# Patient Record
Sex: Female | Born: 1960 | Race: White | Hispanic: No | Smoking: Former smoker
Health system: Southern US, Community
[De-identification: ages and names within clinical notes are randomized; demographics above are authoritative.]

## PROBLEM LIST (undated history)

## (undated) DIAGNOSIS — F32A Depression, unspecified: Secondary | ICD-10-CM

## (undated) DIAGNOSIS — R928 Other abnormal and inconclusive findings on diagnostic imaging of breast: Secondary | ICD-10-CM

## (undated) DIAGNOSIS — K219 Gastro-esophageal reflux disease without esophagitis: Secondary | ICD-10-CM

## (undated) DIAGNOSIS — M8580 Other specified disorders of bone density and structure, unspecified site: Secondary | ICD-10-CM

## (undated) DIAGNOSIS — E785 Hyperlipidemia, unspecified: Secondary | ICD-10-CM

## (undated) DIAGNOSIS — E039 Hypothyroidism, unspecified: Secondary | ICD-10-CM

## (undated) DIAGNOSIS — Z87891 Personal history of nicotine dependence: Secondary | ICD-10-CM

## (undated) DIAGNOSIS — Z1239 Encounter for other screening for malignant neoplasm of breast: Secondary | ICD-10-CM

## (undated) DIAGNOSIS — F329 Major depressive disorder, single episode, unspecified: Secondary | ICD-10-CM

## (undated) HISTORY — DX: Hypercalcemia: E83.52

## (undated) HISTORY — DX: Hyperlipidemia, unspecified: E78.5

## (undated) HISTORY — DX: Personal history of nicotine dependence: Z87.891

## (undated) HISTORY — DX: Encounter for other screening for malignant neoplasm of breast: Z12.39

## (undated) HISTORY — PX: WISDOM TOOTH EXTRACTION: SHX21

## (undated) HISTORY — DX: Other abnormal and inconclusive findings on diagnostic imaging of breast: R92.8

## (undated) HISTORY — DX: Other specified disorders of bone density and structure, unspecified site: M85.80

---

## 2009-11-13 ENCOUNTER — Ambulatory Visit: Payer: Self-pay | Admitting: Family Medicine

## 2010-12-20 ENCOUNTER — Ambulatory Visit: Payer: Self-pay | Admitting: Family Medicine

## 2011-01-08 ENCOUNTER — Ambulatory Visit: Payer: Self-pay | Admitting: Family Medicine

## 2012-01-15 ENCOUNTER — Ambulatory Visit: Payer: Self-pay

## 2012-01-15 LAB — HM MAMMOGRAPHY

## 2012-01-16 ENCOUNTER — Ambulatory Visit: Payer: Self-pay

## 2012-01-21 ENCOUNTER — Ambulatory Visit: Payer: Self-pay

## 2012-03-21 IMAGING — US ULTRASOUND RIGHT BREAST
1 series · 17 of 25 positions shown · non-contrast
Comparison: none

REASON FOR EXAM: AV RT MASS
COMMENTS:

[Series 1: ultrasound right breast · 17 of 25 slices shown]
[im 1/25]
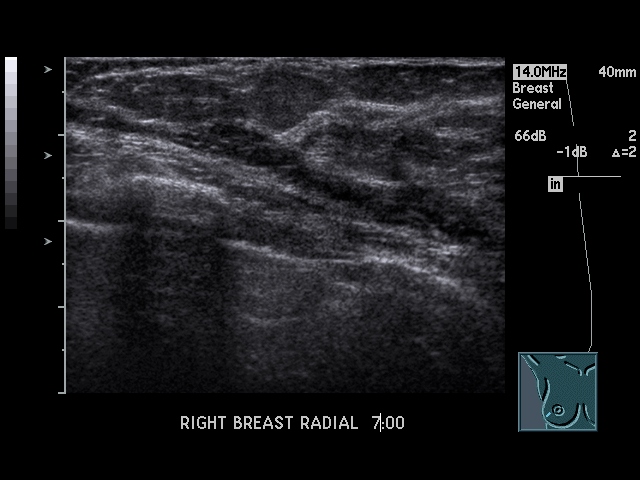
[im 3/25]
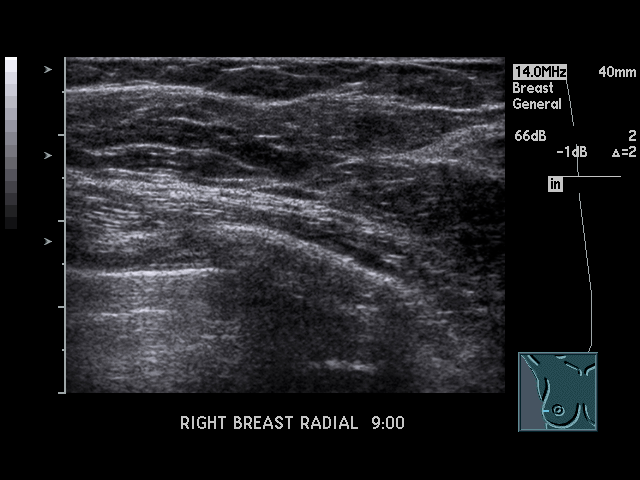
[im 4/25]
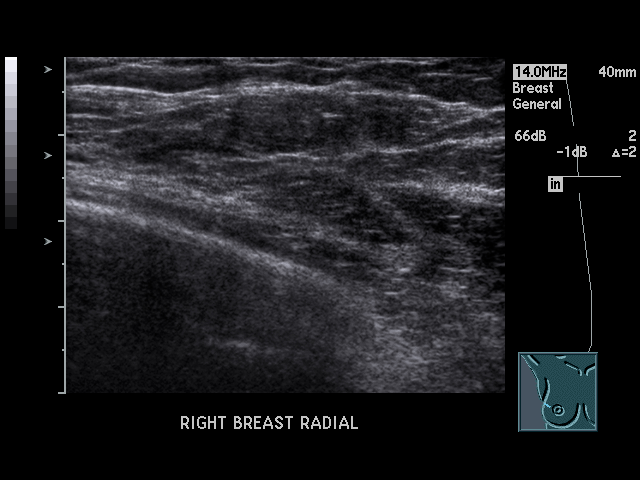
[im 6/25]
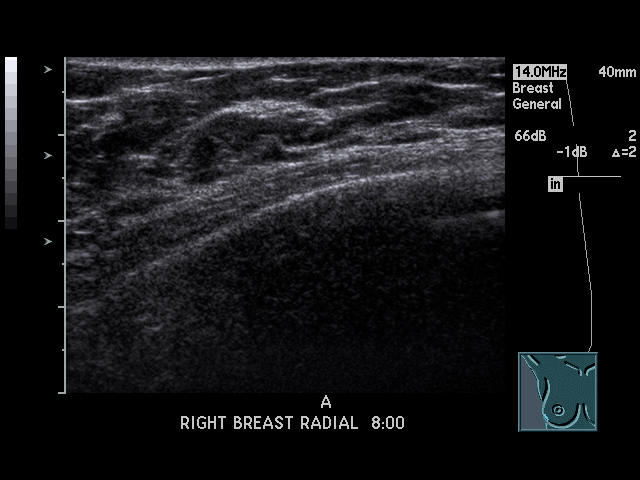
[im 7/25]
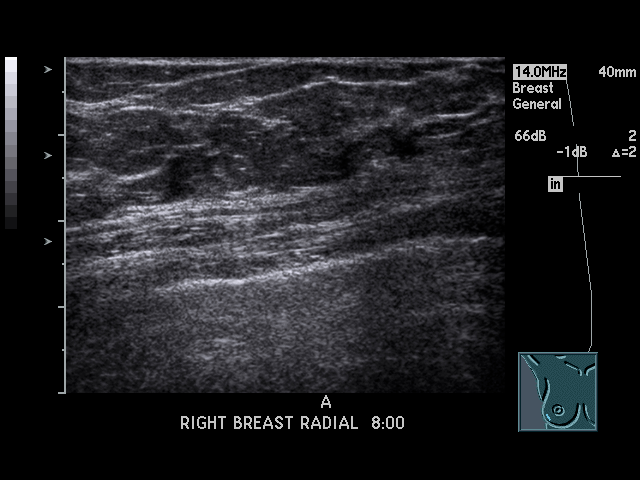
[im 9/25]
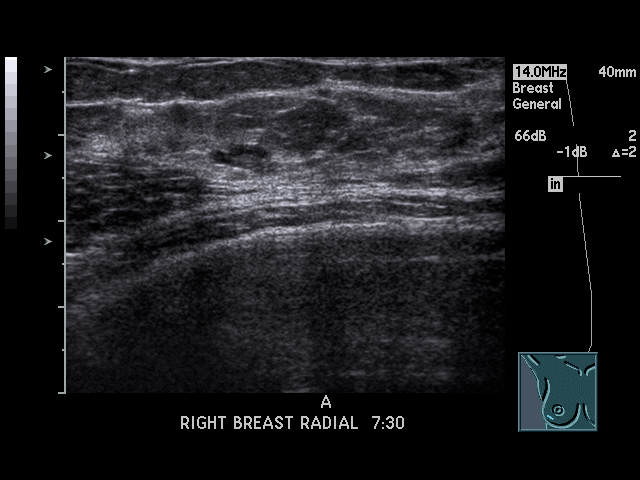
[im 10/25]
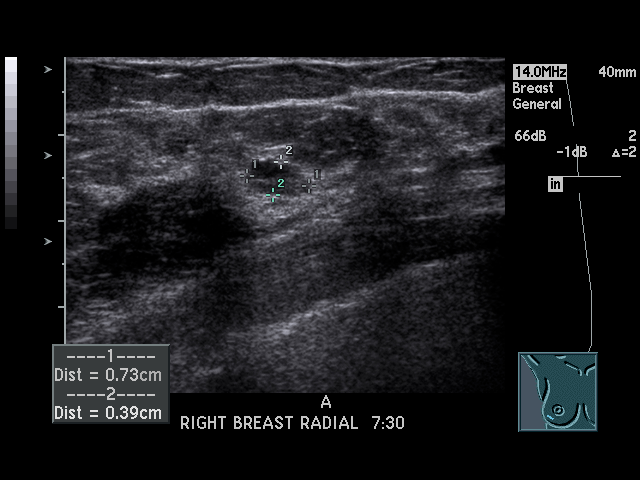
[im 12/25]
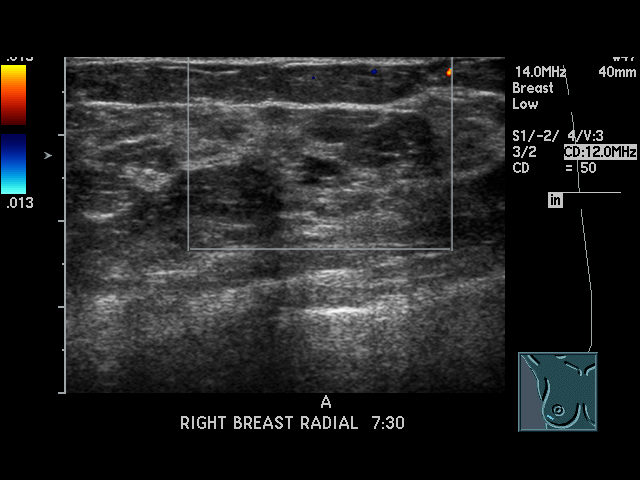
[im 13/25]
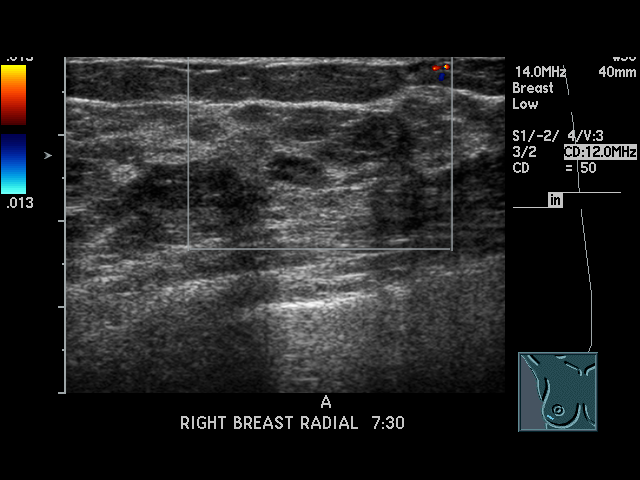
[im 14/25]
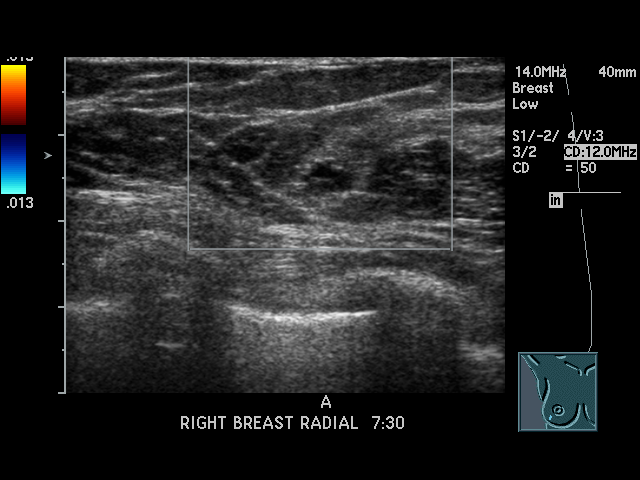
[im 16/25]
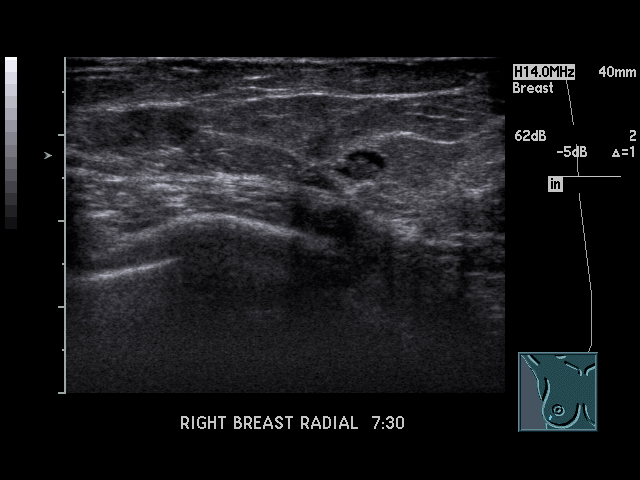
[im 17/25]
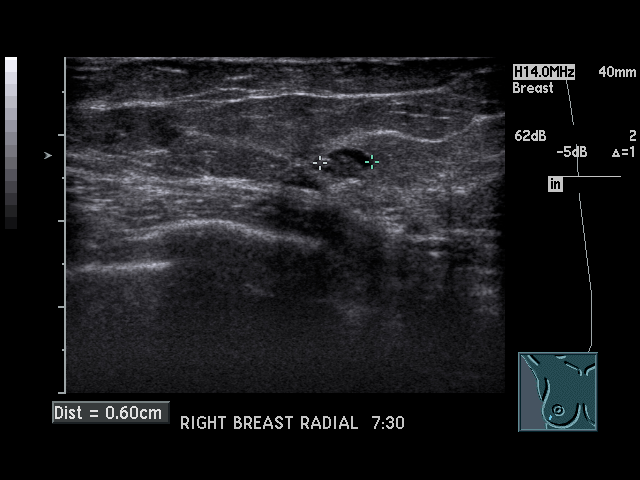
[im 19/25]
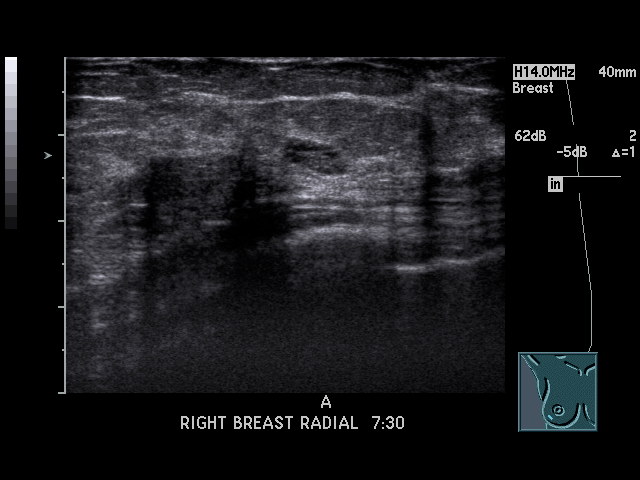
[im 20/25]
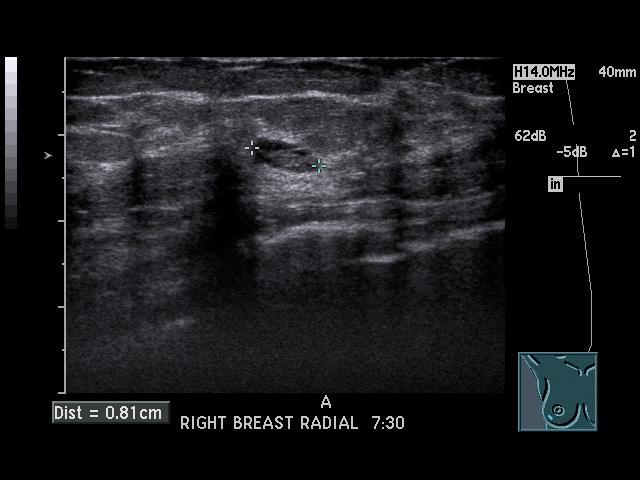
[im 22/25]
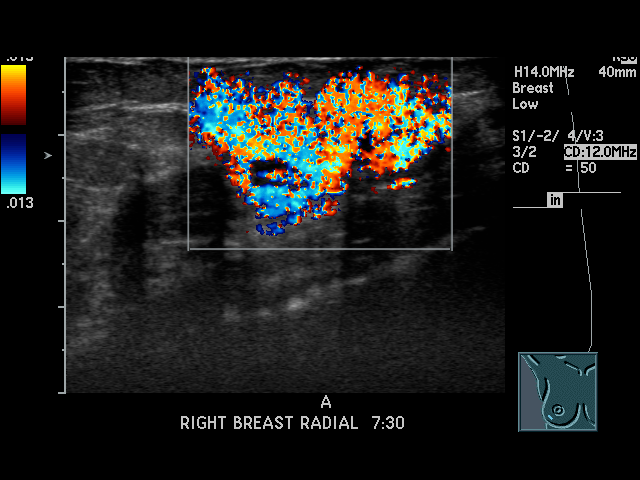
[im 23/25]
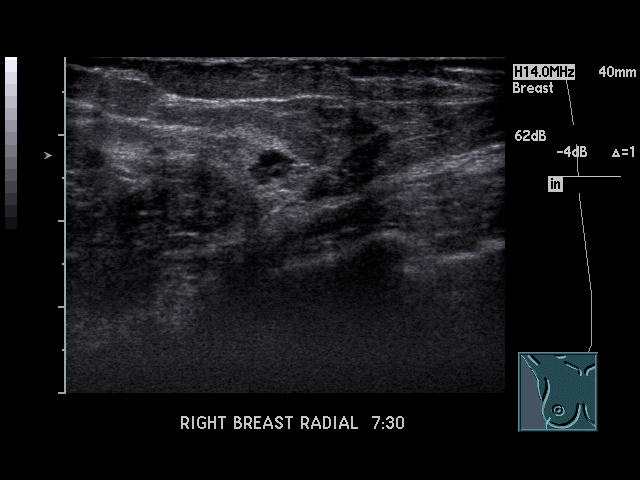
[im 25/25]
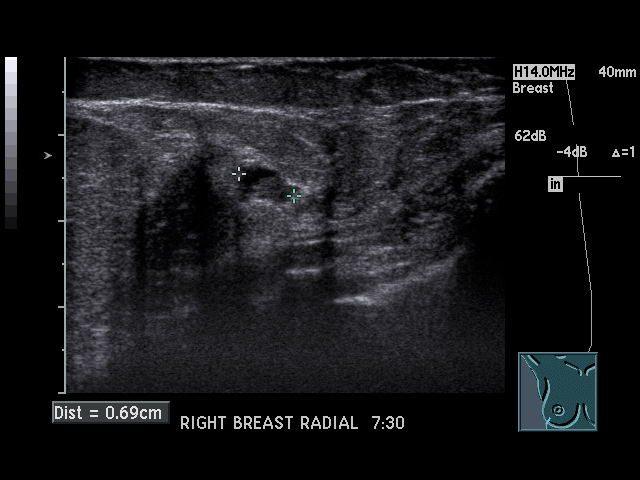

[17 of 25 positions shown; findings below may reference images not displayed]

PROCEDURE:     US  - US BREAST RIGHT  - January 08, 2011  [DATE]

RESULT:     Targeted ultrasound was performed on this date and reported with
the additional views mammogram report also performed this day. In summary,
there is an oval shaped, smoothly marginated 8.1 mm nodule at [DATE] that is
considered sufficient to explain the mammographic finding and is most
compatible with a cyst containing debris. Continued annual screening
mammography is recommended.
IMPRESSION: 1. Please see above.

BI-RADS: Category 2 - Benign Finding.

## 2012-07-03 ENCOUNTER — Encounter: Payer: Self-pay | Admitting: *Deleted

## 2012-07-03 DIAGNOSIS — R928 Other abnormal and inconclusive findings on diagnostic imaging of breast: Secondary | ICD-10-CM | POA: Insufficient documentation

## 2012-07-03 DIAGNOSIS — Z1239 Encounter for other screening for malignant neoplasm of breast: Secondary | ICD-10-CM | POA: Insufficient documentation

## 2012-07-03 DIAGNOSIS — Z87891 Personal history of nicotine dependence: Secondary | ICD-10-CM | POA: Insufficient documentation

## 2012-07-20 ENCOUNTER — Ambulatory Visit: Payer: Self-pay

## 2013-01-20 ENCOUNTER — Ambulatory Visit: Payer: Self-pay

## 2013-02-04 ENCOUNTER — Telehealth: Payer: Self-pay

## 2013-02-04 NOTE — Telephone Encounter (Signed)
Message copied by Sinda Du on Thu Feb 04, 2013  8:15 AM ------      Message from: [place], [place] W      Created: Wed Feb 03, 2013  9:37 PM      Regarding: RE: BCCCP recall       No further follow up required.      ----- Message -----         From: Sinda Du, LPN         Sent: 02/03/2013   2:25 PM           To: Earline Mayotte, MD      Subject: BCCCP recall                                             Terra called today and wanted to cancel her follow up appointment with you. She did not feel that she needed to be seen any more. She said her mammogram report was normal. I called Blima Singer at [facility] and let her know. She just wanted to know what you decided as far as any need for further follow up.       ------

## 2013-02-04 NOTE — Telephone Encounter (Signed)
See copied message. Call to Blima Singer at [facility] to notify her of Dr Rutherford Nail decision.

## 2013-02-17 ENCOUNTER — Ambulatory Visit: Payer: Self-pay | Admitting: General Surgery

## 2014-03-28 ENCOUNTER — Encounter: Payer: Self-pay | Admitting: *Deleted

## 2014-08-10 LAB — HM PAP SMEAR

## 2015-03-14 ENCOUNTER — Ambulatory Visit: Payer: Self-pay | Admitting: Unknown Physician Specialty

## 2015-05-18 ENCOUNTER — Ambulatory Visit (INDEPENDENT_AMBULATORY_CARE_PROVIDER_SITE_OTHER): Payer: BC Managed Care – PPO | Admitting: Family Medicine

## 2015-05-18 ENCOUNTER — Encounter: Payer: Self-pay | Admitting: Family Medicine

## 2015-05-18 VITALS — BP 143/83 | HR 69 | Temp 99.3°F | Ht 64.1 in | Wt 133.0 lb

## 2015-05-18 DIAGNOSIS — J019 Acute sinusitis, unspecified: Secondary | ICD-10-CM

## 2015-05-18 DIAGNOSIS — J329 Chronic sinusitis, unspecified: Secondary | ICD-10-CM | POA: Insufficient documentation

## 2015-05-18 MED ORDER — AMOXICILLIN-POT CLAVULANATE 875-125 MG PO TABS: 1.0000 | ORAL_TABLET | Freq: Two times a day (BID) | ORAL | Status: DC

## 2015-05-18 NOTE — Progress Notes (Signed)
   BP 143/83 mmHg  Pulse 69  Temp(Src) 99.3 F (37.4 C)  Ht 5' 4.1" (1.628 m)  Wt 133 lb (60.328 kg)  BMI 22.76 kg/m2  SpO2 99%  LMP  (LMP Unknown)   Subjective:    Patient ID: Laura Soto, female    DOB: [birth year], 54 y.o.   MRN: [id number]  HPI: Laura Soto is a 54 y.o. female  Chief Complaint  Patient presents with  . URI   With sinus pressure facial pain and congestion and fever or chills been ongoing for 4 weeks just not getting better now getting worse. Patient also coughing medications tried have included everything Tylenol cold and sinus is in next etc.6  Relevant past medical, surgical, family and social history reviewed and updated as indicated. Interim medical history since our last visit reviewed. Allergies and medications reviewed and updated.  Review of Systems  Constitutional: Positive for fever, chills and fatigue.  HENT: Positive for congestion, rhinorrhea, sinus pressure, sneezing and sore throat.   Respiratory: Positive for cough and shortness of breath.   Cardiovascular: Negative for chest pain, palpitations and leg swelling.    Per HPI unless specifically indicated above     Objective:    BP 143/83 mmHg  Pulse 69  Temp(Src) 99.3 F (37.4 C)  Ht 5' 4.1" (1.628 m)  Wt 133 lb (60.328 kg)  BMI 22.76 kg/m2  SpO2 99%  LMP  (LMP Unknown)  Wt Readings from Last 3 Encounters:  05/18/15 133 lb (60.328 kg)  08/10/14 136 lb (61.689 kg)  02/03/12 138 lb (62.596 kg)    Physical Exam  Constitutional: She is oriented to person, place, and time. She appears well-developed and well-nourished. No distress.  HENT:  Head: Normocephalic and atraumatic.  Right Ear: Hearing and external ear normal.  Left Ear: Hearing and external ear normal.  Nose: Nose normal.  Mouth/Throat: Oropharyngeal exudate present.  Eyes: Conjunctivae and lids are normal. Right eye exhibits no discharge. Left eye exhibits no discharge. No scleral icterus.  Neck: Normal range of  motion. Neck supple.  Cardiovascular: Normal rate, regular rhythm and normal heart sounds.   Pulmonary/Chest: Effort normal and breath sounds normal. No respiratory distress.  Musculoskeletal: Normal range of motion.  Lymphadenopathy:    She has no cervical adenopathy.  Neurological: She is alert and oriented to person, place, and time.  Skin: Skin is intact. No rash noted.  Psychiatric: She has a normal mood and affect. Her speech is normal and behavior is normal. Judgment and thought content normal. Cognition and memory are normal.    No results found for this or any previous visit.    Assessment & Plan:   Problem List Items Addressed This Visit      Respiratory   Sinusitis - Primary    Sinusitis discussed care and treatment use of Mucinex Mucinex D Tylenol Tylenol cold and sinus also will start Augmentin patient directions given on how to take to minimize GI effects.      Relevant Medications   amoxicillin-clavulanate (AUGMENTIN) 875-125 MG tablet       Follow up plan: Return if symptoms worsen or fail to improve, for Physical Exam with CW.

## 2015-05-18 NOTE — Assessment & Plan Note (Signed)
Sinusitis discussed care and treatment use of Mucinex Mucinex D Tylenol Tylenol cold and sinus also will start Augmentin patient directions given on how to take to minimize GI effects.

## 2015-05-19 ENCOUNTER — Telehealth: Payer: Self-pay | Admitting: Family Medicine

## 2015-05-19 MED ORDER — DOXYCYCLINE HYCLATE 100 MG PO TABS: 100.0000 mg | ORAL_TABLET | Freq: Two times a day (BID) | ORAL | Status: DC

## 2015-05-19 NOTE — Telephone Encounter (Signed)
Routing to Dr. Sanda Klein to see if she can send in a new antibiotic.  Dr. Jeananne Rama sent in antibiotic (Augmentin) 05/18/2015 for acute sinuitis. Patient has upset stomach, nausea and diarrhea. Maybe phenergan or zofran if don't want to switch antibiotic?

## 2015-05-19 NOTE — Telephone Encounter (Signed)
Pt called stated she is having bad side effects from Amoxtr? Pt states MAC said if this did not work for her to call back and he would send something else into the pharmacy. Pharm is [facility]. Please send different RX, call pt if further information is needed. Pt stated medication is causing diarrhea and nausea. Thanks

## 2015-05-19 NOTE — Telephone Encounter (Signed)
I spoke with patient; will send in different antibiotic

## 2015-05-26 ENCOUNTER — Ambulatory Visit (INDEPENDENT_AMBULATORY_CARE_PROVIDER_SITE_OTHER): Payer: BC Managed Care – PPO | Admitting: Unknown Physician Specialty

## 2015-05-26 ENCOUNTER — Encounter: Payer: Self-pay | Admitting: Unknown Physician Specialty

## 2015-05-26 VITALS — BP 133/80 | HR 74 | Temp 98.6°F | Ht 64.2 in | Wt 136.9 lb

## 2015-05-26 DIAGNOSIS — J0100 Acute maxillary sinusitis, unspecified: Secondary | ICD-10-CM | POA: Diagnosis not present

## 2015-05-26 MED ORDER — CEFUROXIME AXETIL 500 MG PO TABS: 500.0000 mg | ORAL_TABLET | Freq: Two times a day (BID) | ORAL | Status: DC

## 2015-05-26 NOTE — Progress Notes (Signed)
   BP 133/80 mmHg  Pulse 74  Temp(Src) 98.6 F (37 C)  Ht 5' 4.2" (1.631 m)  Wt 136 lb 14.4 oz (62.097 kg)  BMI 23.34 kg/m2  SpO2 98%  LMP  (LMP Unknown)   Subjective:    Patient ID: Laura Soto, female    DOB: [birth year], 54 y.o.   MRN: [id number]  HPI: Laura Soto is a 54 y.o. female  Chief Complaint  Patient presents with  . URI    pt was recenetly seen by Dr. Jeananne Rama for sinus problems but states she has not gotten any better.   Sinusitis This is a new (saw Dr. Jeananne Rama and got Augmentin and could not take it, got switched to Doxycycline.  No better) problem. The current episode started more than 1 month ago. The problem is unchanged. There has been no fever. Associated symptoms include congestion, ear pain, a hoarse voice, shortness of breath, sinus pressure and a sore throat. Past treatments include antibiotics.  Using netti pots and Flonase  Relevant past medical, surgical, family and social history reviewed and updated as indicated. Interim medical history since our last visit reviewed. Allergies and medications reviewed and updated.  Review of Systems  HENT: Positive for congestion, ear pain, hoarse voice, sinus pressure and sore throat.   Respiratory: Positive for shortness of breath.     Per HPI unless specifically indicated above     Objective:    BP 133/80 mmHg  Pulse 74  Temp(Src) 98.6 F (37 C)  Ht 5' 4.2" (1.631 m)  Wt 136 lb 14.4 oz (62.097 kg)  BMI 23.34 kg/m2  SpO2 98%  LMP  (LMP Unknown)  Wt Readings from Last 3 Encounters:  05/26/15 136 lb 14.4 oz (62.097 kg)  05/18/15 133 lb (60.328 kg)  08/10/14 136 lb (61.689 kg)    Physical Exam  Constitutional: She is oriented to person, place, and time. She appears well-developed and well-nourished. No distress.  HENT:  Head: Normocephalic and atraumatic.  Right Ear: Tympanic membrane and ear canal normal.  Left Ear: Tympanic membrane and ear canal normal.  Nose: No rhinorrhea. Right sinus  exhibits maxillary sinus tenderness. Right sinus exhibits no frontal sinus tenderness. Left sinus exhibits maxillary sinus tenderness. Left sinus exhibits no frontal sinus tenderness.  Eyes: Conjunctivae and lids are normal. Right eye exhibits no discharge. Left eye exhibits no discharge. No scleral icterus.  Cardiovascular: Normal rate and regular rhythm.   Pulmonary/Chest: Effort normal and breath sounds normal. No respiratory distress.  Abdominal: Normal appearance. There is no splenomegaly or hepatomegaly.  Musculoskeletal: Normal range of motion.  Neurological: She is alert and oriented to person, place, and time.  Skin: Skin is intact. No rash noted. No pallor.  Psychiatric: She has a normal mood and affect. Her behavior is normal. Judgment and thought content normal.    No results found for this or any previous visit.    Assessment & Plan:   Problem List Items Addressed This Visit    None    Visit Diagnoses    Acute maxillary sinusitis, recurrence not specified    -  Primary    Relevant Medications    cefUROXime (CEFTIN) 500 MG tablet        Follow up plan: Return if symptoms worsen or fail to improve.

## 2015-06-08 ENCOUNTER — Telehealth: Payer: Self-pay

## 2015-06-08 MED ORDER — FLUCONAZOLE 150 MG PO TABS: 150.0000 mg | ORAL_TABLET | Freq: Once | ORAL | Status: DC

## 2015-06-08 NOTE — Telephone Encounter (Signed)
Tried to call patient but she did not answer and I left her a voicemail asking for her to return my call. But like I said, I went back and looked at the paper and it stated a yeast infection and not a UTI.

## 2015-06-08 NOTE — Telephone Encounter (Signed)
My mistake. The paper for the telephone call stated a yeast infection, not a UTI. I will still call the patient and see what kind of symptoms she is having.

## 2015-06-08 NOTE — Telephone Encounter (Signed)
Got a fax from the call center stating that the patient called stating she has a UTI from an antibiotic she was recently given. Patient would like something sent in for her. Patient was seen 05/26/15.

## 2015-06-08 NOTE — Telephone Encounter (Signed)
OK, no problem.  I will call her in some Diflucan

## 2015-06-08 NOTE — Telephone Encounter (Signed)
An antibiotic shoud not cause a UTI and in fact, treats it.  Do you think maybe she has a yeast infection?  Please ask what her symptoms are.  She may need to be seen

## 2015-06-08 NOTE — Telephone Encounter (Signed)
Patient notified

## 2015-07-13 ENCOUNTER — Other Ambulatory Visit: Payer: Self-pay

## 2015-07-13 MED ORDER — CITALOPRAM HYDROBROMIDE 20 MG PO TABS: 20.0000 mg | ORAL_TABLET | Freq: Every day | ORAL | Status: DC

## 2015-07-13 NOTE — Telephone Encounter (Signed)
Patient has physical scheduled for 08/15/15. Pharmacy is [facility] on [address] and they are requesting a 90 day supply.

## 2015-08-15 ENCOUNTER — Ambulatory Visit (INDEPENDENT_AMBULATORY_CARE_PROVIDER_SITE_OTHER): Payer: BC Managed Care – PPO | Admitting: Unknown Physician Specialty

## 2015-08-15 ENCOUNTER — Encounter: Payer: Self-pay | Admitting: Unknown Physician Specialty

## 2015-08-15 VITALS — BP 120/70 | HR 67 | Temp 98.3°F | Ht 65.0 in | Wt 138.0 lb

## 2015-08-15 DIAGNOSIS — Z113 Encounter for screening for infections with a predominantly sexual mode of transmission: Secondary | ICD-10-CM | POA: Diagnosis not present

## 2015-08-15 DIAGNOSIS — Z1239 Encounter for other screening for malignant neoplasm of breast: Secondary | ICD-10-CM | POA: Diagnosis not present

## 2015-08-15 DIAGNOSIS — Z0000 Encounter for general adult medical examination without abnormal findings: Secondary | ICD-10-CM | POA: Diagnosis not present

## 2015-08-15 MED ORDER — CITALOPRAM HYDROBROMIDE 20 MG PO TABS: 20.0000 mg | ORAL_TABLET | Freq: Every day | ORAL | Status: DC

## 2015-08-15 MED ORDER — FLUTICASONE PROPIONATE 50 MCG/ACT NA SUSP: 2.0000 | Freq: Every day | NASAL | Status: DC

## 2015-08-15 NOTE — Progress Notes (Signed)
BP 120/70 mmHg  Pulse 67  Temp(Src) 98.3 F (36.8 C)  Ht 5\' 5"  (1.651 m)  Wt 138 lb (62.596 kg)  BMI 22.96 kg/m2  SpO2 99%  LMP  (LMP Unknown)   Subjective:    Patient ID: Laura Soto, female    DOB: December 03, 1960, 55 y.o.   MRN: [id number]  HPI: Laura Soto is a 55 y.o. female  Chief Complaint  Patient presents with  . Annual Exam   Depression screen [facility] 2/9 08/15/2015  Decreased Interest 0  Down, Depressed, Hopeless 0  PHQ - 2 Score 0    Past Surgical History  Procedure Laterality Date  . Wisdom tooth extraction     Family History  Problem Relation Age of Onset  . Liver cancer Father     age 44  . Diabetes Father    Started smoking at 80 y.o and quit recently. Not interested in low dose CT screening.    Relevant past medical, surgical, family and social history reviewed and updated as indicated. Interim medical history since our last visit reviewed. Allergies and medications reviewed and updated.  Review of Systems  Constitutional: Negative.   HENT: Negative.   Eyes: Negative.   Respiratory: Negative.   Cardiovascular: Negative.   Gastrointestinal: Negative.   Endocrine: Negative.   Genitourinary: Negative.   Musculoskeletal: Negative.   Skin: Negative.   Allergic/Immunologic: Negative.   Neurological: Negative.   Hematological: Negative.   Psychiatric/Behavioral: Negative.     Per HPI unless specifically indicated above     Objective:    BP 120/70 mmHg  Pulse 67  Temp(Src) 98.3 F (36.8 C)  Ht 5\' 5"  (1.651 m)  Wt 138 lb (62.596 kg)  BMI 22.96 kg/m2  SpO2 99%  LMP  (LMP Unknown)  Wt Readings from Last 3 Encounters:  08/15/15 138 lb (62.596 kg)  05/26/15 136 lb 14.4 oz (62.097 kg)  05/18/15 133 lb (60.328 kg)    Physical Exam  Constitutional: She is oriented to person, place, and time. She appears well-developed and well-nourished.  HENT:  Head: Normocephalic and atraumatic.  Eyes: Pupils are equal, round, and reactive to light.  Right eye exhibits no discharge. Left eye exhibits no discharge. No scleral icterus.  Neck: Normal range of motion. Neck supple. Carotid bruit is not present. No thyromegaly present.  Cardiovascular: Normal rate, regular rhythm and normal heart sounds.  Exam reveals no gallop and no friction rub.   No murmur heard. Pulmonary/Chest: Effort normal and breath sounds normal. No respiratory distress. She has no wheezes. She has no rales.  Abdominal: Soft. Bowel sounds are normal. There is no tenderness. There is no rebound.  Genitourinary: No breast swelling, tenderness or discharge.  Musculoskeletal: Normal range of motion.  Lymphadenopathy:    She has no cervical adenopathy.  Neurological: She is alert and oriented to person, place, and time.  Skin: Skin is warm, dry and intact. No rash noted.  Psychiatric: She has a normal mood and affect. Her speech is normal and behavior is normal. Judgment and thought content normal. Cognition and memory are normal.    No results found for this or any previous visit.    Assessment & Plan:   Problem List Items Addressed This Visit    None    Visit Diagnoses    Breast cancer screening    -  Primary    Relevant Orders    MM Digital Screening    Routine screening for STI (sexually transmitted infection)  Relevant Orders    Hepatitis C Antibody    Routine general medical examination at a health care facility        Relevant Orders    Comprehensive metabolic panel    Lipid Panel w/o Chol/HDL Ratio    TSH        Follow up plan: Return in about 1 year (around 08/14/2016).

## 2015-08-16 ENCOUNTER — Telehealth: Payer: Self-pay | Admitting: Unknown Physician Specialty

## 2015-08-16 LAB — COMPREHENSIVE METABOLIC PANEL
A/G RATIO: 1.7 (ref 1.2–2.2)
ALBUMIN: 4.4 g/dL (ref 3.5–5.5)
ALK PHOS: 79 IU/L (ref 39–117)
ALT: 12 IU/L (ref 0–32)
AST: 14 IU/L (ref 0–40)
BILIRUBIN TOTAL: 0.2 mg/dL (ref 0.0–1.2)
BUN / CREAT RATIO: 17 (ref 9–23)
BUN: 18 mg/dL (ref 6–24)
CHLORIDE: 102 mmol/L (ref 96–106)
CO2: 24 mmol/L (ref 18–29)
Calcium: 9.5 mg/dL (ref 8.7–10.2)
Creatinine, Ser: 1.04 mg/dL — ABNORMAL HIGH (ref 0.57–1.00)
GFR calc non Af Amer: 60 mL/min/{1.73_m2} (ref >59)
GFR, EST AFRICAN AMERICAN: 69 mL/min/{1.73_m2} (ref >59)
GLOBULIN, TOTAL: 2.6 g/dL (ref 1.5–4.5)
Glucose: 81 mg/dL (ref 65–99)
POTASSIUM: 4.7 mmol/L (ref 3.5–5.2)
SODIUM: 140 mmol/L (ref 134–144)
TOTAL PROTEIN: 7 g/dL (ref 6.0–8.5)

## 2015-08-16 LAB — TSH: TSH: 18.46 u[IU]/mL — ABNORMAL HIGH (ref 0.450–4.500)

## 2015-08-16 LAB — LIPID PANEL W/O CHOL/HDL RATIO
Cholesterol, Total: 228 mg/dL — ABNORMAL HIGH (ref 100–199)
HDL: 62 mg/dL (ref >39)
LDL Calculated: 145 mg/dL — ABNORMAL HIGH (ref 0–99)
Triglycerides: 104 mg/dL (ref 0–149)
VLDL CHOLESTEROL CAL: 21 mg/dL (ref 5–40)

## 2015-08-16 LAB — HEPATITIS C ANTIBODY: HCV Ab: <0.1 s/co ratio (ref 0.0–0.9)

## 2015-08-16 MED ORDER — LEVOTHYROXINE SODIUM 50 MCG PO TABS: 50.0000 ug | ORAL_TABLET | Freq: Every day | ORAL | Status: DC

## 2015-08-16 NOTE — Telephone Encounter (Signed)
Discussed labs with patient.  TSH is 18.  Start Synthroid 50 mcgs

## 2015-08-17 ENCOUNTER — Telehealth: Payer: Self-pay | Admitting: Unknown Physician Specialty

## 2015-08-17 DIAGNOSIS — E039 Hypothyroidism, unspecified: Secondary | ICD-10-CM

## 2015-08-17 NOTE — Telephone Encounter (Signed)
Pt came in very concerned about lab results, would like a call back for further detail about TSH results. Pt stated information was a lot to absorb over the phone. Please call pt ASAP. Thanks.

## 2015-08-18 NOTE — Telephone Encounter (Signed)
Routing to provider  

## 2015-08-21 NOTE — Telephone Encounter (Signed)
Routing to Cheryl

## 2015-08-21 NOTE — Telephone Encounter (Signed)
Discussed hypothyroid.  Recheck T4 and TSH.  Stop pills for now and recheck in 1-2 weeks.

## 2015-08-21 NOTE — Telephone Encounter (Signed)
Please call pt back ASAP. She is returning Cheryl's call.

## 2015-08-29 NOTE — Telephone Encounter (Signed)
Pt scheduled 11/15/15 @ 4pm. Thanks.

## 2015-08-31 ENCOUNTER — Other Ambulatory Visit: Payer: Self-pay | Admitting: Unknown Physician Specialty

## 2015-09-21 ENCOUNTER — Emergency Department
Admission: EM | Admit: 2015-09-21 | Discharge: 2015-09-21 | Disposition: A | Discharge status: Home / Self Care | Payer: Worker's Compensation | Attending: Emergency Medicine | Admitting: Emergency Medicine

## 2015-09-21 ENCOUNTER — Encounter: Payer: Self-pay | Admitting: *Deleted

## 2015-09-21 DIAGNOSIS — W19XXXA Unspecified fall, initial encounter: Secondary | ICD-10-CM

## 2015-09-21 DIAGNOSIS — Y92000 Kitchen of unspecified non-institutional (private) residence as  the place of occurrence of the external cause: Secondary | ICD-10-CM | POA: Insufficient documentation

## 2015-09-21 DIAGNOSIS — Z79899 Other long term (current) drug therapy: Secondary | ICD-10-CM | POA: Insufficient documentation

## 2015-09-21 DIAGNOSIS — Y9389 Activity, other specified: Secondary | ICD-10-CM | POA: Diagnosis not present

## 2015-09-21 DIAGNOSIS — R51 Headache: Secondary | ICD-10-CM | POA: Diagnosis present

## 2015-09-21 DIAGNOSIS — Z87891 Personal history of nicotine dependence: Secondary | ICD-10-CM | POA: Insufficient documentation

## 2015-09-21 DIAGNOSIS — S0093XA Contusion of unspecified part of head, initial encounter: Secondary | ICD-10-CM

## 2015-09-21 DIAGNOSIS — E785 Hyperlipidemia, unspecified: Secondary | ICD-10-CM | POA: Insufficient documentation

## 2015-09-21 DIAGNOSIS — Y999 Unspecified external cause status: Secondary | ICD-10-CM | POA: Insufficient documentation

## 2015-09-21 DIAGNOSIS — W1800XA Striking against unspecified object with subsequent fall, initial encounter: Secondary | ICD-10-CM | POA: Insufficient documentation

## 2015-09-21 MED ORDER — METHOCARBAMOL 500 MG PO TABS: 500.0000 mg | ORAL_TABLET | Freq: Four times a day (QID) | ORAL | Status: DC

## 2015-09-21 MED ORDER — NAPROXEN 500 MG PO TABS: 500.0000 mg | ORAL_TABLET | Freq: Two times a day (BID) | ORAL | Status: DC

## 2015-09-21 NOTE — Discharge Instructions (Signed)
Facial or Scalp Contusion °A facial or scalp contusion is a deep bruise on the face or head. Injuries to the face and head generally cause a lot of swelling, especially around the eyes. Contusions are the result of an injury that caused bleeding under the skin. The contusion may turn blue, purple, or yellow. Minor injuries will give you a painless contusion, but more severe contusions may stay painful and swollen for a few weeks.  °CAUSES  °A facial or scalp contusion is caused by a blunt injury or trauma to the face or head area.  °SIGNS AND SYMPTOMS  °· Swelling of the injured area.   °· Discoloration of the injured area.   °· Tenderness, soreness, or pain in the injured area.   °DIAGNOSIS  °The diagnosis can be made by taking a medical history and doing a physical exam. An X-ray exam, CT scan, or MRI may be needed to determine if there are any associated injuries, such as broken bones (fractures). °TREATMENT  °Often, the best treatment for a facial or scalp contusion is applying cold compresses to the injured area. Over-the-counter medicines may also be recommended for pain control.  °HOME CARE INSTRUCTIONS  °· Only take over-the-counter or prescription medicines as directed by your health care provider.   °· Apply ice to the injured area.   °· Put ice in a plastic bag.   °· Place a towel between your skin and the bag.   °· Leave the ice on for 20 minutes, 2-3 times a day.   °SEEK MEDICAL CARE IF: °· You have bite problems.   °· You have pain with chewing.   °· You are concerned about facial defects. °SEEK IMMEDIATE MEDICAL CARE IF: °· You have severe pain or a headache that is not relieved by medicine.   °· You have unusual sleepiness, confusion, or personality changes.   °· You throw up (vomit).   °· You have a persistent nosebleed.   °· You have double vision or blurred vision.   °· You have fluid drainage from your nose or ear.   °· You have difficulty walking or using your arms or legs.   °MAKE SURE YOU:   °· Understand these instructions. °· Will watch your condition. °· Will get help right away if you are not doing well or get worse. °  °This information is not intended to replace advice given to you by your health care provider. Make sure you discuss any questions you have with your health care provider. °  °Document Released: 06/20/2004 Document Revised: 06/03/2014 Document Reviewed: 12/24/2012 °Elsevier Interactive Patient Education ©2016 [employer] ° °Head Injury, Adult °You have a head injury. Headaches and throwing up (vomiting) are common after a head injury. It should be easy to wake up from sleeping. Sometimes you must stay in the hospital. Most problems happen within the first 24 hours. Side effects may occur up to 7-10 days after the injury.  °WHAT ARE THE TYPES OF HEAD INJURIES? °Head injuries can be as minor as a bump. Some head injuries can be more severe. More severe head injuries include: °· A jarring injury to the brain (concussion). °· A bruise of the brain (contusion). This mean there is bleeding in the brain that can cause swelling. °· A cracked skull (skull fracture). °· Bleeding in the brain that collects, clots, and forms a bump (hematoma). °WHEN SHOULD I GET HELP RIGHT AWAY?  °· You are confused or sleepy. °· You cannot be woken up. °· You feel sick to your stomach (nauseous) or keep throwing up (vomiting). °· Your dizziness or   unsteadiness is getting worse. °· You have very bad, lasting headaches that are not helped by medicine. Take medicines only as told by your doctor. °· You cannot use your arms or legs like normal. °· You cannot walk. °· You notice changes in the black spots in the center of the colored part of your eye (pupil). °· You have clear or bloody fluid coming from your nose or ears. °· You have trouble seeing. °During the next 24 hours after the injury, you must stay with someone who can watch you. This person should get help right away (call 911 in the [place] if you start  to shake and are not able to control it (have seizures), you pass out, or you are unable to wake up. °HOW CAN I PREVENT A HEAD INJURY IN THE FUTURE? °· Wear seat belts. °· Wear a helmet while bike riding and playing sports like football. °· Stay away from dangerous activities around the house. °WHEN CAN I RETURN TO NORMAL ACTIVITIES AND ATHLETICS? °See your doctor before doing these activities. You should not do normal activities or play contact sports until 1 week after the following symptoms have stopped: °· Headache that does not go away. °· Dizziness. °· Poor attention. °· Confusion. °· Memory problems. °· Sickness to your stomach or throwing up. °· Tiredness. °· Fussiness. °· Bothered by bright lights or loud noises. °· Anxiousness or depression. °· Restless sleep. °MAKE SURE YOU:  °· Understand these instructions. °· Will watch your condition. °· Will get help right away if you are not doing well or get worse. °  °This information is not intended to replace advice given to you by your health care provider. Make sure you discuss any questions you have with your health care provider. °  °Document Released: 04/25/2008 Document Revised: 06/03/2014 Document Reviewed: 01/18/2013 °Elsevier Interactive Patient Education ©2016 [employer] Inc. ° °

## 2015-09-21 NOTE — ED Notes (Addendum)
States she fell at work and hit her head, denies any LOC or using blood thinners, awake and alert, workers comp, was sent to [facility] for evaluation

## 2015-09-21 NOTE — ED Notes (Signed)
States her foot caught and she fell backwards  Hit head   No loc

## 2015-09-21 NOTE — ED Provider Notes (Signed)
[facility] Emergency Department Provider Note  ____________________________________________  Time seen: Approximately 3:08 PM  I have reviewed the triage vital signs and the nursing notes.   HISTORY  Chief Complaint Fall    HPI Laura Soto is a 55 y.o. female who presents to emergency Department from work status post a fall. 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 She fell backwards landing on her back and striking her head on the floor. Patient states that she did not lose consciousness at any time. She does report a "goose egg" to the back of her head. Patient denies any visual changes, neck pain, shortness of breath, chest pain, numbness or tingling in any extremity. Patient does endorse a mild generalized headache. Patient has used ice to the back of her head but denies taking any medication prior to arrival.   Past Medical History  Diagnosis Date  . Personal history of tobacco use, presenting hazards to health   . Abnormal mammogram, unspecified   . Breast screening, unspecified   . Osteopenia   . Hyperlipidemia   . Hypercalcemia     Patient Active Problem List   Diagnosis Date Noted  . Sinusitis 05/18/2015  . Breast screening, unspecified   . Personal history of tobacco use, presenting hazards to health   . Abnormal mammogram, unspecified     Past Surgical History  Procedure Laterality Date  . Wisdom tooth extraction      Current Outpatient Rx  Name  Route  Sig  Dispense  Refill  . citalopram (CELEXA) 20 MG tablet   Oral   Take 1 tablet (20 mg total) by mouth daily.   90 tablet   1   . fluticasone (FLONASE) 50 MCG/ACT nasal spray   Each Nare   Place 2 sprays into both nostrils daily.   16 g   12   . levothyroxine (SYNTHROID, LEVOTHROID) 50 MCG tablet   Oral   Take 1 tablet (50 mcg total) by mouth daily.   30 tablet   3   . methocarbamol (ROBAXIN) 500 MG  tablet   Oral   Take 1 tablet (500 mg total) by mouth 4 (four) times daily.   16 tablet   0   . naproxen (NAPROSYN) 500 MG tablet   Oral   Take 1 tablet (500 mg total) by mouth 2 (two) times daily with a meal.   60 tablet   0     Allergies Review of patient's allergies indicates no known allergies.  Family History  Problem Relation Age of Onset  . Liver cancer Father     age 88  . Diabetes Father     Social History Social History  Substance Use Topics  . Smoking status: Former Smoker -- 0.25 packs/day for 20 years    Types: Cigarettes    Quit date: 12/26/2014  . Smokeless tobacco: Never Used  . Alcohol Use: Yes     Comment: occasionally - wine     Review of Systems  Constitutional: No fever/chills Eyes: No visual changes.  Cardiovascular: no chest pain. Respiratory: no cough. No SOB. Gastrointestinal:   No nausea, no vomiting.  Musculoskeletal: Negative for musculoskeletal pain Skin: Negative for rash. Negative for lacerations. Neurological: Negative for headaches, focal weakness or numbness. 10-point ROS otherwise negative.  ____________________________________________   PHYSICAL EXAM:  VITAL SIGNS: ED Triage Vitals  Enc Vitals Group     BP 09/21/15  1334 148/76 mmHg     Pulse Rate 09/21/15 1334 75     Resp 09/21/15 1334 18     Temp 09/21/15 1334 98.7 F (37.1 C)     Temp Source 09/21/15 1334 Oral     SpO2 --      Weight 09/21/15 1334 135 lb (61.236 kg)     Height 09/21/15 1334 5\' 5"  (1.651 m)     Head Cir --      Peak Flow --      Pain Score 09/21/15 1335 6     Pain Loc --      Pain Edu? --      Excl. in [place]? --      Constitutional: Alert and oriented. Well appearing and in no acute distress. Eyes: Conjunctivae are normal. PERRL. EOMI. Head: Hematoma noted to the right-sided occipital region. Patient is nontender to palpation over this. No crepitus to palpation. No other tenderness to palpation over bony landmarks of the skull. No battle  signs. No raccoon eyes. No epistaxis. ENT:      Ears:       Nose: No epistaxis      Mouth/Throat: Mucous membranes are moist.  Neck: No stridor.  No cervical spine tenderness to palpation. Mild tenderness to palpation in the right-sided paraspinal muscle group. Spasm is noted on the right side. Cardiovascular: Normal rate, regular rhythm. Normal S1 and S2.  Good peripheral circulation. Respiratory: Normal respiratory effort without tachypnea or retractions. Lungs CTAB. Musculoskeletal: Full range of motion to all extremities. Neurologic:  Normal speech and language. No gross focal neurologic deficits are appreciated. Cranial nerves II through XII are grossly intact. Skin:  Skin is warm, dry and intact. No rash noted. Psychiatric: Mood and affect are normal. Speech and behavior are normal. Patient exhibits appropriate insight and judgement.   ____________________________________________   LABS (all labs ordered are listed, but only abnormal results are displayed)  Labs Reviewed - No data to display ____________________________________________  EKG   ____________________________________________  RADIOLOGY   No results found.  ____________________________________________    PROCEDURES  Procedure(s) performed:       Medications - No data to display   ____________________________________________   INITIAL IMPRESSION / ASSESSMENT AND PLAN / ED COURSE  Pertinent labs & imaging results that were available during my care of the patient were reviewed by me and considered in my medical decision making (see chart for details).  Patient's diagnosis is consistent with head injury from a fall. Patient is neurologically intact and did not lose consciousness at any time. As such, no CT scans were ordered at this time.. Patient will be discharged home with prescriptions for anti-inflammatories and muscle relaxers. Patient is to follow up with primary care provider if symptoms  persist past this treatment course. Patient is given ED precautions to return to the ED for any worsening or new symptoms.     ____________________________________________  FINAL CLINICAL IMPRESSION(S) / ED DIAGNOSES  Final diagnoses:  Traumatic hematoma of head, initial encounter  Fall, initial encounter      NEW MEDICATIONS STARTED DURING THIS VISIT:  New Prescriptions   METHOCARBAMOL (ROBAXIN) 500 MG TABLET    Take 1 tablet (500 mg total) by mouth 4 (four) times daily.   NAPROXEN (NAPROSYN) 500 MG TABLET    Take 1 tablet (500 mg total) by mouth 2 (two) times daily with a meal.        This chart was dictated using voice recognition software/Dragon. Despite best efforts to  proofread, errors can occur which can change the meaning. Any change was purely unintentional.    Darletta Moll, PA-C 09/21/15 1528  Daymon Larsen, MD 09/21/15 1728

## 2015-11-15 ENCOUNTER — Ambulatory Visit: Payer: BC Managed Care – PPO | Admitting: Unknown Physician Specialty

## 2015-12-22 ENCOUNTER — Telehealth: Payer: Self-pay | Admitting: Unknown Physician Specialty

## 2015-12-22 NOTE — Telephone Encounter (Signed)
Patient notified

## 2015-12-22 NOTE — Telephone Encounter (Signed)
Please ask her to take OTC Flonase.  If she feels she needs antibiotics, I will need to see her.

## 2015-12-22 NOTE — Telephone Encounter (Signed)
Pt called and stated that her ears were clogged and she was having sinus issues and would like to know if she could have something called in to [facility].

## 2015-12-25 ENCOUNTER — Ambulatory Visit (INDEPENDENT_AMBULATORY_CARE_PROVIDER_SITE_OTHER): Payer: BC Managed Care – PPO | Admitting: Unknown Physician Specialty

## 2015-12-25 ENCOUNTER — Encounter: Payer: Self-pay | Admitting: Unknown Physician Specialty

## 2015-12-25 DIAGNOSIS — Z7189 Other specified counseling: Secondary | ICD-10-CM | POA: Diagnosis not present

## 2015-12-25 DIAGNOSIS — E039 Hypothyroidism, unspecified: Secondary | ICD-10-CM | POA: Diagnosis not present

## 2015-12-25 DIAGNOSIS — J019 Acute sinusitis, unspecified: Secondary | ICD-10-CM

## 2015-12-25 DIAGNOSIS — Z7184 Encounter for health counseling related to travel: Secondary | ICD-10-CM | POA: Insufficient documentation

## 2015-12-25 MED ORDER — CIPROFLOXACIN HCL 500 MG PO TABS: 500.0000 mg | ORAL_TABLET | Freq: Two times a day (BID) | ORAL | 0 refills | Status: DC

## 2015-12-25 MED ORDER — AMOXICILLIN 875 MG PO TABS: 875.0000 mg | ORAL_TABLET | Freq: Two times a day (BID) | ORAL | 0 refills | Status: DC

## 2015-12-25 NOTE — Assessment & Plan Note (Signed)
Rx for Cipro

## 2015-12-25 NOTE — Assessment & Plan Note (Signed)
Rx for Amoxil

## 2015-12-25 NOTE — Progress Notes (Signed)
BP 121/85 (BP Location: Left Arm, Patient Position: Sitting, Cuff Size: Normal)   Pulse (!) 59   Temp 98.5 F (36.9 C)   Ht 5' 5.2" (1.656 m)   Wt 143 lb 6.4 oz (65 kg)   LMP  (LMP Unknown)   SpO2 99%   BMI 23.72 kg/m    Subjective:    Patient ID: Laura Soto, female    DOB: 22-Aug-1960, 55 y.o.   MRN: [id number]  HPI: Laura Soto is a 55 y.o. female  Chief Complaint  Patient presents with  . URI    pt states she has congestion and stuffy nose. States symptoms started about 2 weeks ago    URI   This is a new problem. Episode onset: 2 weeks. The problem has been unchanged. There has been no fever. Associated symptoms include congestion, ear pain and sinus pain. Pertinent negatives include no coughing, headaches, nausea or rhinorrhea.   Hypothyroid Pt states that she is taking no thyroid medications as she has not symptoms.  Last agreed to recheck in 3 months.    Travel Wants something for travelers diarrhea  Relevant past medical, surgical, family and social history reviewed and updated as indicated. Interim medical history since our last visit reviewed. Allergies and medications reviewed and updated.  Review of Systems  HENT: Positive for congestion and ear pain. Negative for rhinorrhea.   Respiratory: Negative for cough.   Gastrointestinal: Negative for nausea.  Neurological: Negative for headaches.    Per HPI unless specifically indicated above     Objective:    BP 121/85 (BP Location: Left Arm, Patient Position: Sitting, Cuff Size: Normal)   Pulse (!) 59   Temp 98.5 F (36.9 C)   Ht 5' 5.2" (1.656 m)   Wt 143 lb 6.4 oz (65 kg)   LMP  (LMP Unknown)   SpO2 99%   BMI 23.72 kg/m   Wt Readings from Last 3 Encounters:  12/25/15 143 lb 6.4 oz (65 kg)  09/21/15 135 lb (61.2 kg)  08/15/15 138 lb (62.6 kg)    Physical Exam  Constitutional: She is oriented to person, place, and time. She appears well-developed and well-nourished. No distress.  HENT:    Head: Normocephalic and atraumatic.  Right Ear: Tympanic membrane and ear canal normal.  Left Ear: Tympanic membrane and ear canal normal.  Nose: No rhinorrhea. Right sinus exhibits maxillary sinus tenderness. Right sinus exhibits no frontal sinus tenderness. Left sinus exhibits maxillary sinus tenderness. Left sinus exhibits no frontal sinus tenderness.  Eyes: Conjunctivae and lids are normal. Right eye exhibits no discharge. Left eye exhibits no discharge. No scleral icterus.  Cardiovascular: Normal rate and regular rhythm.   Pulmonary/Chest: Effort normal and breath sounds normal. No respiratory distress.  Abdominal: Normal appearance. There is no splenomegaly or hepatomegaly.  Musculoskeletal: Normal range of motion.  Neurological: She is alert and oriented to person, place, and time.  Skin: Skin is intact. No rash noted. No pallor.  Psychiatric: She has a normal mood and affect. Her behavior is normal. Judgment and thought content normal.    Results for orders placed or performed in visit on 08/15/15  HM MAMMOGRAPHY  Result Value Ref Range   HM Mammogram from PP   HM PAP SMEAR  Result Value Ref Range   HM Pap smear from PP       Assessment & Plan:   Problem List Items Addressed This Visit      Unprioritized   Sinusitis  Rx for Amoxil      Relevant Medications   ciprofloxacin (CIPRO) 500 MG tablet   amoxicillin (AMOXIL) 875 MG tablet   Travel advice encounter    Rx for Cipro      Relevant Medications   ciprofloxacin (CIPRO) 500 MG tablet    Other Visit Diagnoses    Hypothyroidism, unspecified hypothyroidism type       Check TSH and t4      Instruction given on how to take Cipro if needed.   Follow up plan: Return if symptoms worsen or fail to improve.

## 2015-12-26 ENCOUNTER — Telehealth: Payer: Self-pay | Admitting: Unknown Physician Specialty

## 2015-12-26 DIAGNOSIS — E039 Hypothyroidism, unspecified: Secondary | ICD-10-CM

## 2015-12-26 DIAGNOSIS — E063 Autoimmune thyroiditis: Secondary | ICD-10-CM | POA: Insufficient documentation

## 2015-12-26 LAB — TSH: TSH: 40.75 u[IU]/mL — ABNORMAL HIGH (ref 0.450–4.500)

## 2015-12-26 LAB — T4, FREE: FREE T4: 0.46 ng/dL — AB (ref 0.82–1.77)

## 2015-12-26 MED ORDER — LEVOTHYROXINE SODIUM 75 MCG PO TABS: 75.0000 ug | ORAL_TABLET | Freq: Every day | ORAL | 0 refills | Status: DC

## 2015-12-26 NOTE — Telephone Encounter (Signed)
Called patient to discuss High TSH and low T4.  Despite she is asymptomatic, start Levothyroxine.  Recheck labs in 3 months for dose adjustment.

## 2016-03-06 ENCOUNTER — Encounter: Payer: Self-pay | Admitting: Unknown Physician Specialty

## 2016-03-06 ENCOUNTER — Ambulatory Visit (INDEPENDENT_AMBULATORY_CARE_PROVIDER_SITE_OTHER): Payer: BC Managed Care – PPO | Admitting: Unknown Physician Specialty

## 2016-03-06 VITALS — BP 139/84 | HR 67 | Temp 98.1°F | Ht 65.6 in | Wt 147.0 lb

## 2016-03-06 DIAGNOSIS — J302 Other seasonal allergic rhinitis: Secondary | ICD-10-CM

## 2016-03-06 DIAGNOSIS — E039 Hypothyroidism, unspecified: Secondary | ICD-10-CM

## 2016-03-06 DIAGNOSIS — J309 Allergic rhinitis, unspecified: Secondary | ICD-10-CM | POA: Insufficient documentation

## 2016-03-06 DIAGNOSIS — F329 Major depressive disorder, single episode, unspecified: Secondary | ICD-10-CM

## 2016-03-06 DIAGNOSIS — F418 Other specified anxiety disorders: Secondary | ICD-10-CM

## 2016-03-06 DIAGNOSIS — F32A Depression, unspecified: Secondary | ICD-10-CM

## 2016-03-06 DIAGNOSIS — F419 Anxiety disorder, unspecified: Secondary | ICD-10-CM

## 2016-03-06 MED ORDER — FLUTICASONE PROPIONATE 50 MCG/ACT NA SUSP: 2.0000 | Freq: Every day | NASAL | 12 refills | Status: DC

## 2016-03-06 MED ORDER — CITALOPRAM HYDROBROMIDE 40 MG PO TABS: 40.0000 mg | ORAL_TABLET | Freq: Every day | ORAL | 3 refills | Status: DC

## 2016-03-06 NOTE — Assessment & Plan Note (Addendum)
Increase Citalpram from 20 mg to 40 mg.  Discussed mindfullness medidation, yoga, exercise.  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

## 2016-03-06 NOTE — Assessment & Plan Note (Signed)
Refill Flonase. 

## 2016-03-06 NOTE — Progress Notes (Signed)
BP 139/84 (BP Location: Left Arm, Patient Position: Sitting, Cuff Size: Normal)   Pulse 67   Temp 98.1 F (36.7 C)   Ht 5' 5.6" (1.666 m) Comment: pt had flip flops  Wt 147 lb (66.7 kg) Comment: pt had flip flops on  LMP  (LMP Unknown)   SpO2 100%   BMI 24.02 kg/m    Subjective:    Patient ID: Laura Soto, female    DOB: 02-16-1961, 55 y.o.   MRN: [id number]  HPI: Laura Soto is a 55 y.o. female  Chief Complaint  Patient presents with  . Anxiety    pt states she just wants to talk with Malachy Mood   . Medication Refill    pt states she needs refill on flonase    Pt states she "has a lot going on" but finding work is stressful and "has a very busy pace."  She feels out of control.  She is taking Citalopram and wonders if it needs to be increaed.  She did stop smoking and gained weight. She is fatigued much of the time.    Depression screen [facility] 2/9 03/06/2016 08/15/2015  Decreased Interest 0 0  Down, Depressed, Hopeless 1 0  PHQ - 2 Score 1 0  Altered sleeping 1 -  Tired, decreased energy 2 -  Change in appetite 0 -  Feeling bad or failure about yourself  0 -  Trouble concentrating 0 -  Moving slowly or fidgety/restless 1 -  Suicidal thoughts 0 -  PHQ-9 Score 5 -   Allergic Rhinitis Controlled with Flonase and would like a refill.    Relevant past medical, surgical, family and social history reviewed and updated as indicated. Interim medical history since our last visit reviewed. Allergies and medications reviewed and updated.  Review of Systems  Per HPI unless specifically indicated above     Objective:    BP 139/84 (BP Location: Left Arm, Patient Position: Sitting, Cuff Size: Normal)   Pulse 67   Temp 98.1 F (36.7 C)   Ht 5' 5.6" (1.666 m) Comment: pt had flip flops  Wt 147 lb (66.7 kg) Comment: pt had flip flops on  LMP  (LMP Unknown)   SpO2 100%   BMI 24.02 kg/m   Wt Readings from Last 3 Encounters:  03/06/16 147 lb (66.7 kg)  12/25/15 143 lb 6.4  oz (65 kg)  09/21/15 135 lb (61.2 kg)    Physical Exam  Constitutional: She is oriented to person, place, and time. She appears well-developed and well-nourished. No distress.  HENT:  Head: Normocephalic and atraumatic.  Eyes: Conjunctivae and lids are normal. Right eye exhibits no discharge. Left eye exhibits no discharge. No scleral icterus.  Neck: Normal range of motion. Neck supple. No JVD present. Carotid bruit is not present.  Cardiovascular: Normal rate, regular rhythm and normal heart sounds.   Pulmonary/Chest: Effort normal and breath sounds normal.  Abdominal: Normal appearance. There is no splenomegaly or hepatomegaly.  Musculoskeletal: Normal range of motion.  Neurological: She is alert and oriented to person, place, and time.  Skin: Skin is warm, dry and intact. No rash noted. No pallor.  Psychiatric: She has a normal mood and affect. Her behavior is normal. Judgment and thought content normal.    Results for orders placed or performed in visit on 12/25/15  TSH  Result Value Ref Range   TSH 40.750 (H) 0.450 - 4.500 uIU/mL  T4, free  Result Value Ref Range   Free T4  0.46 (L) 0.82 - 1.77 ng/dL      Assessment & Plan:   Problem List Items Addressed This Visit      Unprioritized   Allergic rhinitis    Refill Flonase      Anxiety and depression    Increase Citalpram from 20 mg to 40 mg.  Discussed mindfullness medidation, yoga, exercise.  Pt refusing counseling at this point.        Hypothyroidism - Primary   Relevant Orders   TSH    Other Visit Diagnoses   None.      Follow up plan: Return in about 5 weeks (around 04/10/2016) for Note to be out of work.

## 2016-03-07 LAB — TSH: TSH: 9.26 u[IU]/mL — ABNORMAL HIGH (ref 0.450–4.500)

## 2016-04-08 ENCOUNTER — Telehealth: Payer: Self-pay | Admitting: Unknown Physician Specialty

## 2016-04-08 MED ORDER — LEVOTHYROXINE SODIUM 100 MCG PO TABS: 100.0000 ug | ORAL_TABLET | Freq: Every day | ORAL | 0 refills | Status: DC

## 2016-04-08 NOTE — Telephone Encounter (Signed)
Routing to provider. Patient had labs done in October and wanted to increase dose. I do not see where this increase was sent in.

## 2016-04-08 NOTE — Telephone Encounter (Signed)
Pt called needs a refill on Levothyroxine. Pharm is [facility]. Thanks.

## 2016-04-10 ENCOUNTER — Ambulatory Visit: Payer: BC Managed Care – PPO | Admitting: Unknown Physician Specialty

## 2016-04-16 ENCOUNTER — Ambulatory Visit: Payer: BC Managed Care – PPO | Admitting: Unknown Physician Specialty

## 2016-05-01 ENCOUNTER — Ambulatory Visit (INDEPENDENT_AMBULATORY_CARE_PROVIDER_SITE_OTHER): Payer: BC Managed Care – PPO | Admitting: Unknown Physician Specialty

## 2016-05-01 ENCOUNTER — Encounter: Payer: Self-pay | Admitting: Unknown Physician Specialty

## 2016-05-01 DIAGNOSIS — F418 Other specified anxiety disorders: Secondary | ICD-10-CM | POA: Diagnosis not present

## 2016-05-01 DIAGNOSIS — F419 Anxiety disorder, unspecified: Secondary | ICD-10-CM

## 2016-05-01 DIAGNOSIS — E039 Hypothyroidism, unspecified: Secondary | ICD-10-CM

## 2016-05-01 DIAGNOSIS — F329 Major depressive disorder, single episode, unspecified: Secondary | ICD-10-CM

## 2016-05-01 NOTE — Progress Notes (Signed)
BP 116/77 (BP Location: Left Arm, Patient Position: Sitting, Cuff Size: Normal)   Pulse 68   Temp 98.5 F (36.9 C)   Wt 145 lb 12.8 oz (66.1 kg)   LMP  (LMP Unknown)   SpO2 98%   BMI 23.82 kg/m    Subjective:    Patient ID: Laura Soto, female    DOB: Aug 08, 1960, 55 y.o.   MRN: [id number]  HPI: [name] is a 55 y.o. female  Chief Complaint  Patient presents with  . Anxiety    5 week f/up   Depression/anxiety Pt states "all is good."   We increased her Citalopram last visit.  Her TSH is elevated last visit.  And synthroid was increased.    Depression screen [facility] 2/9 05/01/2016 03/06/2016 08/15/2015  Decreased Interest 0 0 0  Down, Depressed, Hopeless 0 1 0  PHQ - 2 Score 0 1 0  Altered sleeping - 1 -  Tired, decreased energy - 2 -  Change in appetite - 0 -  Feeling bad or failure about yourself  - 0 -  Trouble concentrating - 0 -  Moving slowly or fidgety/restless - 1 -  Suicidal thoughts - 0 -  PHQ-9 Score - 5 -    Menopause Still has hot flashes.   Past Surgical History:  Procedure Laterality Date  . WISDOM TOOTH EXTRACTION       Relevant past medical, surgical, family and social history reviewed and updated as indicated. Interim medical history since our last visit reviewed. Allergies and medications reviewed and updated.  Review of Systems  Per HPI unless specifically indicated above     Objective:    BP 116/77 (BP Location: Left Arm, Patient Position: Sitting, Cuff Size: Normal)   Pulse 68   Temp 98.5 F (36.9 C)   Wt 145 lb 12.8 oz (66.1 kg)   LMP  (LMP Unknown)   SpO2 98%   BMI 23.82 kg/m   Wt Readings from Last 3 Encounters:  05/01/16 145 lb 12.8 oz (66.1 kg)  03/06/16 147 lb (66.7 kg)  12/25/15 143 lb 6.4 oz (65 kg)    Physical Exam  Constitutional: She is oriented to person, place, and time. She appears well-developed and well-nourished. No distress.  HENT:  Head: Normocephalic and atraumatic.  Eyes: Conjunctivae and lids are  normal. Right eye exhibits no discharge. Left eye exhibits no discharge. No scleral icterus.  Neck: Normal range of motion. Neck supple. No JVD present. Carotid bruit is not present.  Cardiovascular: Normal rate, regular rhythm and normal heart sounds.   Pulmonary/Chest: Effort normal and breath sounds normal.  Abdominal: Normal appearance. There is no splenomegaly or hepatomegaly.  Musculoskeletal: Normal range of motion.  Neurological: She is alert and oriented to person, place, and time.  Skin: Skin is warm, dry and intact. No rash noted. No pallor.  Psychiatric: She has a normal mood and affect. Her behavior is normal. Judgment and thought content normal.    Results for orders placed or performed in visit on 03/06/16  TSH  Result Value Ref Range   TSH 9.260 (H) 0.450 - 4.500 uIU/mL      Assessment & Plan:   Problem List Items Addressed This Visit      Unprioritized   Anxiety and depression    Stable, continue present medications.        Hypothyroidism    Check TSH in 6 weeks          Follow up plan:  Return in about 6 weeks (around 06/12/2016).

## 2016-05-01 NOTE — Assessment & Plan Note (Signed)
Check TSH in 6 weeks.

## 2016-05-01 NOTE — Assessment & Plan Note (Signed)
Stable, continue present medications.   

## 2016-06-14 ENCOUNTER — Ambulatory Visit: Payer: BC Managed Care – PPO | Admitting: Unknown Physician Specialty

## 2016-06-19 ENCOUNTER — Other Ambulatory Visit: Payer: Self-pay | Admitting: Unknown Physician Specialty

## 2016-06-19 MED ORDER — LEVOTHYROXINE SODIUM 100 MCG PO TABS: 100.0000 ug | ORAL_TABLET | Freq: Every day | ORAL | 0 refills | Status: DC

## 2016-06-19 NOTE — Telephone Encounter (Signed)
Pt would like to get enough levothyroxine (SYNTHROID, LEVOTHROID) 100 MCG tablet to last until her appt on 02/16. She stated she had enough to last 1 more week. Pt uses [facility].

## 2016-06-19 NOTE — Telephone Encounter (Signed)
Routing to provider  

## 2016-07-09 ENCOUNTER — Telehealth: Payer: Self-pay | Admitting: Unknown Physician Specialty

## 2016-07-09 NOTE — Telephone Encounter (Signed)
Called over to [place] because according to chart, patient should have medication. It was sent in 06/19/16 for a 90 day supply. Pharmacy tech stated that the patient was there picking up her medication now.

## 2016-07-09 NOTE — Telephone Encounter (Signed)
Patient needs refill on her Levothyroxine ASAP. She says she needs it now.   [facility] [employer]

## 2016-07-12 ENCOUNTER — Ambulatory Visit: Payer: BC Managed Care – PPO | Admitting: Unknown Physician Specialty

## 2016-07-15 ENCOUNTER — Other Ambulatory Visit: Payer: Self-pay | Admitting: Unknown Physician Specialty

## 2016-07-15 MED ORDER — CITALOPRAM HYDROBROMIDE 40 MG PO TABS: 40.0000 mg | ORAL_TABLET | Freq: Every day | ORAL | 3 refills | Status: DC

## 2016-07-15 NOTE — Telephone Encounter (Signed)
Routing to provider  

## 2016-07-15 NOTE — Telephone Encounter (Signed)
Pt would like a refill for citalopram (CELEXA) 40 MG tablet sent to [facility].

## 2016-08-16 ENCOUNTER — Ambulatory Visit (INDEPENDENT_AMBULATORY_CARE_PROVIDER_SITE_OTHER): Payer: BC Managed Care – PPO | Admitting: Unknown Physician Specialty

## 2016-08-16 ENCOUNTER — Encounter: Payer: Self-pay | Admitting: Unknown Physician Specialty

## 2016-08-16 VITALS — BP 114/74 | HR 71 | Temp 98.8°F | Ht 63.6 in | Wt 144.0 lb

## 2016-08-16 DIAGNOSIS — F418 Other specified anxiety disorders: Secondary | ICD-10-CM

## 2016-08-16 DIAGNOSIS — E039 Hypothyroidism, unspecified: Secondary | ICD-10-CM | POA: Diagnosis not present

## 2016-08-16 DIAGNOSIS — Z0000 Encounter for general adult medical examination without abnormal findings: Secondary | ICD-10-CM | POA: Diagnosis not present

## 2016-08-16 DIAGNOSIS — F419 Anxiety disorder, unspecified: Secondary | ICD-10-CM

## 2016-08-16 DIAGNOSIS — F329 Major depressive disorder, single episode, unspecified: Secondary | ICD-10-CM

## 2016-08-16 MED ORDER — CITALOPRAM HYDROBROMIDE 40 MG PO TABS: 40.0000 mg | ORAL_TABLET | Freq: Every day | ORAL | 3 refills | Status: DC

## 2016-08-16 MED ORDER — LEVOTHYROXINE SODIUM 100 MCG PO TABS: 100.0000 ug | ORAL_TABLET | Freq: Every day | ORAL | 0 refills | Status: DC

## 2016-08-16 NOTE — Assessment & Plan Note (Signed)
Stable, continue present medications.   

## 2016-08-16 NOTE — Progress Notes (Signed)
BP 114/74 (BP Location: Left Arm, Patient Position: Sitting, Cuff Size: Normal)   Pulse 71   Temp 98.8 F (37.1 C)   Ht 5' 3.6" (1.615 m)   Wt 144 lb (65.3 kg)   LMP  (LMP Unknown)   SpO2 97%   BMI 25.03 kg/m    Subjective:    Patient ID: Laura Soto, female    DOB: 1961/03/09, 56 y.o.   MRN: [id number]  HPI: Laura Soto is a 57 y.o. female  Chief Complaint  Patient presents with  . Annual Exam   Anxiety/depression Pt has a lot going on.  She says her mother had a severe stroke.  Despite this, she is doing well with her anxiety and depression.   Depression screen [facility] 2/9 08/16/2016 05/01/2016 03/06/2016 08/15/2015  Decreased Interest 0 0 0 0  Down, Depressed, Hopeless 0 0 1 0  PHQ - 2 Score 0 0 1 0  Altered sleeping - - 1 -  Tired, decreased energy - - 2 -  Change in appetite - - 0 -  Feeling bad or failure about yourself  - - 0 -  Trouble concentrating - - 0 -  Moving slowly or fidgety/restless - - 1 -  Suicidal thoughts - - 0 -  PHQ-9 Score - - 5 -   Hypothyroid Taking Synthroid and doing well.    Former smoker Refusing CT.    Social History   Social History  . Marital status: Divorced    Spouse name: N/A  . Number of children: N/A  . Years of education: N/A   Occupational History  . Not on file.   Social History Main Topics  . Smoking status: Former Smoker    Packs/day: 0.25    Years: 20.00    Types: Cigarettes    Quit date: 12/26/2014  . Smokeless tobacco: Never Used  . Alcohol use Yes     Comment: occasionally - wine  . Drug use: No  . Sexual activity: No   Other Topics Concern  . Not on file   Social History Narrative  . No narrative on file   Family History  Problem Relation Age of Onset  . Liver cancer Father     age 14  . Diabetes Father   . Stroke Mother   . Atrial fibrillation Mother    Past Medical History:  Diagnosis Date  . Abnormal mammogram, unspecified   . Breast screening, unspecified   . Hypercalcemia   .  Hyperlipidemia   . Osteopenia   . Personal history of tobacco use, presenting hazards to health    Past Surgical History:  Procedure Laterality Date  . WISDOM TOOTH EXTRACTION       Relevant past medical, surgical, family and social history reviewed and updated as indicated. Interim medical history since our last visit reviewed. Allergies and medications reviewed and updated.  Review of Systems  Per HPI unless specifically indicated above     Objective:    BP 114/74 (BP Location: Left Arm, Patient Position: Sitting, Cuff Size: Normal)   Pulse 71   Temp 98.8 F (37.1 C)   Ht 5' 3.6" (1.615 m)   Wt 144 lb (65.3 kg)   LMP  (LMP Unknown)   SpO2 97%   BMI 25.03 kg/m   Wt Readings from Last 3 Encounters:  08/16/16 144 lb (65.3 kg)  05/01/16 145 lb 12.8 oz (66.1 kg)  03/06/16 147 lb (66.7 kg)    Physical Exam  Constitutional: She is oriented to person, place, and time. She appears well-developed and well-nourished. No distress.  HENT:  Head: Normocephalic and atraumatic.  Eyes: Conjunctivae and lids are normal. Right eye exhibits no discharge. Left eye exhibits no discharge. No scleral icterus.  Neck: Normal range of motion. Neck supple. No JVD present. Carotid bruit is not present.  Cardiovascular: Normal rate, regular rhythm and normal heart sounds.   Pulmonary/Chest: Effort normal and breath sounds normal.  Abdominal: Normal appearance. There is no splenomegaly or hepatomegaly.  Musculoskeletal: Normal range of motion.  Neurological: She is alert and oriented to person, place, and time.  Skin: Skin is warm, dry and intact. No rash noted. No pallor.  Psychiatric: She has a normal mood and affect. Her behavior is normal. Judgment and thought content normal.  refusing breast exam  Results for orders placed or performed in visit on 03/06/16  TSH  Result Value Ref Range   TSH 9.260 (H) 0.450 - 4.500 uIU/mL      Assessment & Plan:   Problem List Items Addressed This  Visit      Unprioritized   Anxiety and depression    Stable, continue present medications.        Relevant Medications   citalopram (CELEXA) 40 MG tablet   Hypothyroidism - Primary   Relevant Medications   levothyroxine (SYNTHROID, LEVOTHROID) 100 MCG tablet   Other Relevant Orders   TSH    Other Visit Diagnoses    Routine general medical examination at a health care facility       Relevant Orders   Comprehensive metabolic panel   CBC with Differential/Platelet   Lipid Panel w/o Chol/HDL Ratio   MM DIGITAL SCREENING BILATERAL       Follow up plan: Return in about 6 months (around 02/16/2017).

## 2016-08-17 LAB — COMPREHENSIVE METABOLIC PANEL
ALK PHOS: 80 IU/L (ref 39–117)
ALT: 13 IU/L (ref 0–32)
AST: 16 IU/L (ref 0–40)
Albumin/Globulin Ratio: 1.7 (ref 1.2–2.2)
Albumin: 4.3 g/dL (ref 3.5–5.5)
BUN/Creatinine Ratio: 19 (ref 9–23)
BUN: 25 mg/dL — ABNORMAL HIGH (ref 6–24)
CHLORIDE: 101 mmol/L (ref 96–106)
CO2: 22 mmol/L (ref 18–29)
CREATININE: 1.31 mg/dL — AB (ref 0.57–1.00)
Calcium: 9.4 mg/dL (ref 8.7–10.2)
GFR calc Af Amer: 52 mL/min/{1.73_m2} — ABNORMAL LOW (ref >59)
GFR calc non Af Amer: 45 mL/min/{1.73_m2} — ABNORMAL LOW (ref >59)
GLOBULIN, TOTAL: 2.5 g/dL (ref 1.5–4.5)
Glucose: 85 mg/dL (ref 65–99)
POTASSIUM: 4.9 mmol/L (ref 3.5–5.2)
SODIUM: 139 mmol/L (ref 134–144)
Total Protein: 6.8 g/dL (ref 6.0–8.5)

## 2016-08-17 LAB — CBC WITH DIFFERENTIAL/PLATELET
BASOS ABS: 0 10*3/uL (ref 0.0–0.2)
Basos: 0 %
EOS (ABSOLUTE): 0.2 10*3/uL (ref 0.0–0.4)
EOS: 2 %
Hematocrit: 38.4 % (ref 34.0–46.6)
Hemoglobin: 13.3 g/dL (ref 11.1–15.9)
IMMATURE GRANULOCYTES: 0 %
Immature Grans (Abs): 0 10*3/uL (ref 0.0–0.1)
Lymphocytes Absolute: 4.4 10*3/uL — ABNORMAL HIGH (ref 0.7–3.1)
Lymphs: 45 %
MCH: 30.5 pg (ref 26.6–33.0)
MCHC: 34.6 g/dL (ref 31.5–35.7)
MCV: 88 fL (ref 79–97)
MONOS ABS: 0.8 10*3/uL (ref 0.1–0.9)
Monocytes: 8 %
NEUTROS PCT: 45 %
Neutrophils Absolute: 4.4 10*3/uL (ref 1.4–7.0)
Platelets: 266 10*3/uL (ref 150–379)
RBC: 4.36 x10E6/uL (ref 3.77–5.28)
RDW: 13.8 % (ref 12.3–15.4)
WBC: 9.8 10*3/uL (ref 3.4–10.8)

## 2016-08-17 LAB — LIPID PANEL W/O CHOL/HDL RATIO
CHOLESTEROL TOTAL: 225 mg/dL — AB (ref 100–199)
HDL: 56 mg/dL (ref >39)
LDL Calculated: 135 mg/dL — ABNORMAL HIGH (ref 0–99)
TRIGLYCERIDES: 172 mg/dL — AB (ref 0–149)
VLDL Cholesterol Cal: 34 mg/dL (ref 5–40)

## 2016-08-17 LAB — TSH: TSH: 1.66 u[IU]/mL (ref 0.450–4.500)

## 2016-08-19 ENCOUNTER — Telehealth: Payer: Self-pay | Admitting: Unknown Physician Specialty

## 2016-08-19 DIAGNOSIS — R944 Abnormal results of kidney function studies: Secondary | ICD-10-CM

## 2016-08-19 NOTE — Telephone Encounter (Signed)
Left message on the phone to recheck labs when hydrated

## 2016-08-28 ENCOUNTER — Telehealth: Payer: Self-pay | Admitting: Unknown Physician Specialty

## 2016-08-28 DIAGNOSIS — R944 Abnormal results of kidney function studies: Secondary | ICD-10-CM

## 2016-08-28 NOTE — Telephone Encounter (Signed)
Recheck labs.  Pt states she will come in next week between 4:30 and 5p

## 2016-09-05 ENCOUNTER — Other Ambulatory Visit: Payer: BC Managed Care – PPO

## 2016-09-05 DIAGNOSIS — R944 Abnormal results of kidney function studies: Secondary | ICD-10-CM

## 2016-09-06 LAB — COMPREHENSIVE METABOLIC PANEL
A/G RATIO: 1.8 (ref 1.2–2.2)
ALT: 19 IU/L (ref 0–32)
AST: 18 IU/L (ref 0–40)
Albumin: 4.2 g/dL (ref 3.5–5.5)
Alkaline Phosphatase: 84 IU/L (ref 39–117)
BUN/Creatinine Ratio: 20 (ref 9–23)
BUN: 20 mg/dL (ref 6–24)
Bilirubin Total: 0.2 mg/dL (ref 0.0–1.2)
CALCIUM: 9.4 mg/dL (ref 8.7–10.2)
CO2: 24 mmol/L (ref 18–29)
CREATININE: 1.01 mg/dL — AB (ref 0.57–1.00)
Chloride: 100 mmol/L (ref 96–106)
GFR calc Af Amer: 71 mL/min/{1.73_m2} (ref >59)
GFR calc non Af Amer: 62 mL/min/{1.73_m2} (ref >59)
GLUCOSE: 97 mg/dL (ref 65–99)
Globulin, Total: 2.4 g/dL (ref 1.5–4.5)
Potassium: 4.8 mmol/L (ref 3.5–5.2)
Sodium: 139 mmol/L (ref 134–144)
Total Protein: 6.6 g/dL (ref 6.0–8.5)

## 2016-12-10 ENCOUNTER — Other Ambulatory Visit: Payer: Self-pay | Admitting: Unknown Physician Specialty

## 2016-12-10 DIAGNOSIS — E039 Hypothyroidism, unspecified: Secondary | ICD-10-CM

## 2016-12-10 DIAGNOSIS — F419 Anxiety disorder, unspecified: Principal | ICD-10-CM

## 2016-12-10 DIAGNOSIS — F329 Major depressive disorder, single episode, unspecified: Secondary | ICD-10-CM

## 2016-12-10 MED ORDER — LEVOTHYROXINE SODIUM 100 MCG PO TABS: 100.0000 ug | ORAL_TABLET | Freq: Every day | ORAL | 0 refills | Status: DC

## 2016-12-10 MED ORDER — CITALOPRAM HYDROBROMIDE 40 MG PO TABS: 40.0000 mg | ORAL_TABLET | Freq: Every day | ORAL | 3 refills | Status: DC

## 2016-12-10 NOTE — Telephone Encounter (Signed)
Routing to provider. Levothyroxine is already a 90 day prescription but can we change the citalopram to a 90 day prescription as well per patient request please?

## 2016-12-10 NOTE — Telephone Encounter (Signed)
Patient needs the levothyroxine refilled as well.

## 2016-12-10 NOTE — Telephone Encounter (Signed)
Patient stopped in to ask for a refill on her prescriptions for citalopram and levothyroxine. Patient would like to get the citalopram in a 90 day quantity if possible. Patient also asked if levothyroxine could be in a 90 day quantity if not already.   Please Advise.  Thank you

## 2017-01-30 ENCOUNTER — Encounter: Payer: Self-pay | Admitting: Unknown Physician Specialty

## 2017-01-30 ENCOUNTER — Encounter: Payer: Self-pay | Admitting: Family Medicine

## 2017-01-30 ENCOUNTER — Ambulatory Visit (INDEPENDENT_AMBULATORY_CARE_PROVIDER_SITE_OTHER): Payer: BC Managed Care – PPO | Admitting: Family Medicine

## 2017-01-30 VITALS — BP 128/72 | HR 77 | Temp 100.3°F | Wt 141.0 lb

## 2017-01-30 DIAGNOSIS — B9789 Other viral agents as the cause of diseases classified elsewhere: Secondary | ICD-10-CM | POA: Diagnosis not present

## 2017-01-30 DIAGNOSIS — J069 Acute upper respiratory infection, unspecified: Secondary | ICD-10-CM | POA: Diagnosis not present

## 2017-01-30 DIAGNOSIS — R351 Nocturia: Secondary | ICD-10-CM | POA: Diagnosis not present

## 2017-01-30 LAB — UA/M W/RFLX CULTURE, ROUTINE
BILIRUBIN UA: NEGATIVE
Glucose, UA: NEGATIVE
LEUKOCYTES UA: NEGATIVE
Nitrite, UA: NEGATIVE
PH UA: 6 (ref 5.0–7.5)
Specific Gravity, UA: 1.025 (ref 1.005–1.030)
UUROB: 0.2 mg/dL (ref 0.2–1.0)

## 2017-01-30 LAB — MICROSCOPIC EXAMINATION: Bacteria, UA: NONE SEEN

## 2017-01-30 MED ORDER — OXYBUTYNIN CHLORIDE ER 5 MG PO TB24: 5.0000 mg | ORAL_TABLET | Freq: Every day | ORAL | 3 refills | Status: DC

## 2017-01-30 MED ORDER — AMOXICILLIN-POT CLAVULANATE 875-125 MG PO TABS: 1.0000 | ORAL_TABLET | Freq: Two times a day (BID) | ORAL | 0 refills | Status: DC

## 2017-01-30 NOTE — Progress Notes (Signed)
BP 128/72   Pulse 77   Temp 100.3 F (37.9 C)   Wt 141 lb (64 kg)   LMP  (LMP Unknown)   SpO2 98%   BMI 24.51 kg/m    Subjective:    Patient ID: Laura Soto, female    DOB: [birth year], 58 y.o.   MRN: [id number]  HPI: Laura Soto is a 56 y.o. female  Chief Complaint  Patient presents with  . Sinusitis    x 2 days, fever, sore throat, head congestion, sinus drainage, achey, some ear pressure, dry cough. No chest congestion.   Sore throat, post-nasal drainage, fever, cough, facial pressure x 2 days. Has not taken anything for relief. Denies CP, SOB, N/V/D. Multiple sick contacts.   Also has long hx of nocturia, worsening the past few weeks per pt. Feeling like her sleep quality is significantly suffering from it. Has never tried anything for sxs. Denies dysuria, hematuria, incontinence.   Past Medical History:  Diagnosis Date  . Abnormal mammogram, unspecified   . Breast screening, unspecified   . Hypercalcemia   . Hyperlipidemia   . Osteopenia   . Personal history of tobacco use, presenting hazards to health    Social History   Social History  . Marital status: Divorced    Spouse name: N/A  . Number of children: N/A  . Years of education: N/A   Occupational History  . Not on file.   Social History Main Topics  . Smoking status: Former Smoker    Packs/day: 0.25    Years: 20.00    Types: Cigarettes    Quit date: 12/26/2014  . Smokeless tobacco: Never Used  . Alcohol use Yes     Comment: occasionally - wine  . Drug use: No  . Sexual activity: No   Other Topics Concern  . Not on file   Social History Narrative  . No narrative on file    Relevant past medical, surgical, family and social history reviewed and updated as indicated. Interim medical history since our last visit reviewed. Allergies and medications reviewed and updated.  Review of Systems  Constitutional: Positive for diaphoresis, fatigue and fever.  HENT: Positive for congestion,  postnasal drip, sinus pain and sinus pressure.   Eyes: Negative.   Respiratory: Positive for cough.   Cardiovascular: Negative.   Gastrointestinal: Negative.   Genitourinary: Positive for frequency (nocturia).  Musculoskeletal: Negative.   Neurological: Negative.   Psychiatric/Behavioral: Negative.    Per HPI unless specifically indicated above     Objective:    BP 128/72   Pulse 77   Temp 100.3 F (37.9 C)   Wt 141 lb (64 kg)   LMP  (LMP Unknown)   SpO2 98%   BMI 24.51 kg/m   Wt Readings from Last 3 Encounters:  01/30/17 141 lb (64 kg)  08/16/16 144 lb (65.3 kg)  05/01/16 145 lb 12.8 oz (66.1 kg)    Physical Exam  Constitutional: She is oriented to person, place, and time. She appears well-developed and well-nourished.  HENT:  Head: Atraumatic.  Right Ear: External ear normal.  Left Ear: External ear normal.  Oropharynx erythematous Nasal mucosa injected  Eyes: Pupils are equal, round, and reactive to light. Conjunctivae are normal.  Neck: Normal range of motion. Neck supple.  Cardiovascular: Normal rate and normal heart sounds.   Pulmonary/Chest: Effort normal and breath sounds normal. No respiratory distress.  Musculoskeletal: Normal range of motion.  Neurological: She is alert and oriented to person,  place, and time.  Skin: Skin is warm and dry.  Psychiatric: She has a normal mood and affect. Her behavior is normal.  Nursing note and vitals reviewed.     Assessment & Plan:   Problem List Items Addressed This Visit    None    Visit Diagnoses    Viral URI with cough    -  Primary   Supportive care reviewed with mucinex, tylenol or ibuprofen prn, sinus rinses. Augmentin sent if worsening over the next week. F/u if no improvement   Nocturia       U/A neg for UTI. Discussed not drinking liquids after 7 pm, pt opting also to try ditropan. Risks and benefits reviewed,    Relevant Orders   UA/M w/rflx Culture, Routine (Completed)       Follow up  plan: Return for 6 month f/u, bladder f/u with Malachy Mood.

## 2017-02-02 NOTE — Patient Instructions (Signed)
Follow up as scheduled.  

## 2017-03-12 ENCOUNTER — Other Ambulatory Visit: Payer: Self-pay | Admitting: Unknown Physician Specialty

## 2017-03-12 DIAGNOSIS — E039 Hypothyroidism, unspecified: Secondary | ICD-10-CM

## 2017-05-07 ENCOUNTER — Other Ambulatory Visit: Payer: Self-pay | Admitting: Family Medicine

## 2017-05-07 DIAGNOSIS — E039 Hypothyroidism, unspecified: Secondary | ICD-10-CM

## 2017-05-07 NOTE — Telephone Encounter (Signed)
Pt. Stopped by office to get a refill on prescription for levothyroxine sent to [facility].   Patient would like a 90 day prescription if possible. Informed pt. that we cannot guarantee her refill will be 90 day quantity. Medication refills quantity is based on provider.  Pt. Would like a call once prescription has been filled.   Please Advise.  Thank you

## 2017-06-13 ENCOUNTER — Encounter: Payer: Self-pay | Admitting: Unknown Physician Specialty

## 2017-06-13 ENCOUNTER — Ambulatory Visit: Payer: BC Managed Care – PPO | Admitting: Unknown Physician Specialty

## 2017-06-13 VITALS — BP 114/83 | HR 101 | Temp 98.7°F | Ht 65.3 in | Wt 136.4 lb

## 2017-06-13 DIAGNOSIS — K219 Gastro-esophageal reflux disease without esophagitis: Secondary | ICD-10-CM | POA: Insufficient documentation

## 2017-06-13 DIAGNOSIS — R112 Nausea with vomiting, unspecified: Secondary | ICD-10-CM

## 2017-06-13 DIAGNOSIS — E039 Hypothyroidism, unspecified: Secondary | ICD-10-CM

## 2017-06-13 LAB — CBC WITH DIFFERENTIAL/PLATELET
HEMOGLOBIN: 12 g/dL (ref 11.1–15.9)
Hematocrit: 35.3 % (ref 34.0–46.6)
Lymphocytes Absolute: 3.2 10*3/uL — ABNORMAL HIGH (ref 0.7–3.1)
Lymphs: 29 %
MCH: 30.4 pg (ref 26.6–33.0)
MCHC: 34 g/dL (ref 31.5–35.7)
MCV: 89 fL (ref 79–97)
MID (ABSOLUTE): 0.7 10*3/uL (ref 0.1–1.6)
MID: 6 %
NEUTROS ABS: 7.1 10*3/uL — AB (ref 1.4–7.0)
Neutrophils: 65 %
Platelets: 299 10*3/uL (ref 150–379)
RBC: 3.95 x10E6/uL (ref 3.77–5.28)
RDW: 13.9 % (ref 12.3–15.4)
WBC: 11 10*3/uL — ABNORMAL HIGH (ref 3.4–10.8)

## 2017-06-13 MED ORDER — ONDANSETRON HCL 4 MG PO TABS: 4.0000 mg | ORAL_TABLET | Freq: Three times a day (TID) | ORAL | 0 refills | Status: DC | PRN

## 2017-06-13 MED ORDER — OMEPRAZOLE 20 MG PO CPDR: 20.0000 mg | DELAYED_RELEASE_CAPSULE | Freq: Two times a day (BID) | ORAL | 6 refills | Status: DC

## 2017-06-13 NOTE — Assessment & Plan Note (Signed)
Rx for Omeprazole BID

## 2017-06-13 NOTE — Addendum Note (Signed)
Addended by: Kathrine Haddock on: 06/13/2017 09:56 AM   Modules accepted: Orders

## 2017-06-13 NOTE — Progress Notes (Addendum)
BP 114/83   Pulse (!) 101   Temp 98.7 F (37.1 C) (Oral)   Ht 5' 5.3" (1.659 m)   Wt 136 lb 6.4 oz (61.9 kg)   LMP  (LMP Unknown)   SpO2 98%   BMI 22.49 kg/m    Subjective:    Patient ID: Laura Soto, female    DOB: 1960-07-02, 57 y.o.   MRN: [id number]  HPI: Laura Soto is a 57 y.o. female  Chief Complaint  Patient presents with  . Gastroesophageal Reflux    pt states she thinks she is having trouble with acid reflux or food poisioning. States she threw up brown stuff and has had dark brown stools  . Labs Only    pt would like to have labs for her thyroid   Pt states she had self incuded vomiting yesterday after feeling something "was stuck."  She ate "taco stuff" in school. Had 2 other episodes of self induced vomiting.  States stool is black and feels constipated.   She feels it is related to acid reflux.  No problems with urination.  Denies fever.  No rash.  Often trouble swallowing water.    Hypothyroid Dose of Levothyroxine raised several months ago.  Needs f/u TSH  Relevant past medical, surgical, family and social history reviewed and updated as indicated. Interim medical history since our last visit reviewed. Allergies and medications reviewed and updated.  Review of Systems  Constitutional: Negative.   HENT: Negative.   Respiratory: Negative.   Cardiovascular: Negative.   Psychiatric/Behavioral: Negative.     Per HPI unless specifically indicated above     Objective:    BP 114/83   Pulse (!) 101   Temp 98.7 F (37.1 C) (Oral)   Ht 5' 5.3" (1.659 m)   Wt 136 lb 6.4 oz (61.9 kg)   LMP  (LMP Unknown)   SpO2 98%   BMI 22.49 kg/m   Wt Readings from Last 3 Encounters:  06/13/17 136 lb 6.4 oz (61.9 kg)  01/30/17 141 lb (64 kg)  08/16/16 144 lb (65.3 kg)    Physical Exam  Constitutional: She is oriented to person, place, and time. She appears well-developed and well-nourished. No distress.  HENT:  Head: Normocephalic and atraumatic.  Eyes:  Conjunctivae and lids are normal. Right eye exhibits no discharge. Left eye exhibits no discharge. No scleral icterus.  Neck: Normal range of motion. Neck supple. No JVD present. Carotid bruit is not present.  Cardiovascular: Normal rate, regular rhythm and normal heart sounds.  Pulmonary/Chest: Effort normal and breath sounds normal.  Abdominal: Normal appearance. There is no splenomegaly or hepatomegaly. There is no tenderness. There is no rebound and no guarding.  Rectal exam.  Unable to obtain stool for testing  Musculoskeletal: Normal range of motion.  Neurological: She is alert and oriented to person, place, and time.  Skin: Skin is warm, dry and intact. No rash noted. No pallor.  Psychiatric: She has a normal mood and affect. Her behavior is normal. Judgment and thought content normal.   CBC with mild WBC elevation H/H within normal limits  Results for orders placed or performed in visit on 01/30/17  Microscopic Examination  Result Value Ref Range   WBC, UA 0-5 0 - 5 /hpf   RBC, UA 0-2 0 - 2 /hpf   Epithelial Cells (non renal) 0-10 0 - 10 /hpf   Mucus, UA Present (A) Not Estab.   Bacteria, UA None seen None seen/Few  UA/M  w/rflx Culture, Routine  Result Value Ref Range   Specific Gravity, UA 1.025 1.005 - 1.030   pH, UA 6.0 5.0 - 7.5   Color, UA Yellow Yellow   Appearance Ur Clear Clear   Leukocytes, UA Negative Negative   Protein, UA 2+ (A) Negative/Trace   Glucose, UA Negative Negative   Ketones, UA Trace (A) Negative   RBC, UA 1+ (A) Negative   Bilirubin, UA Negative Negative   Urobilinogen, Ur 0.2 0.2 - 1.0 mg/dL   Nitrite, UA Negative Negative   Microscopic Examination See below:       Assessment & Plan:   Problem List Items Addressed This Visit      Unprioritized   GERD (gastroesophageal reflux disease)    Rx for Omeprazole BID      Relevant Medications   ondansetron (ZOFRAN) 4 MG tablet   omeprazole (PRILOSEC) 20 MG capsule   Other Relevant Orders    Ambulatory referral to Gastroenterology   Hypothyroidism   Relevant Orders   TSH    Other Visit Diagnoses    Non-intractable vomiting with nausea, unspecified vomiting type    -  Primary   Presumed related to GERD.  However, symptoms are dramatic and will f/u in 1 week.  Clear liquid diet.  Progress to BRAT. Zofran for nausea    Relevant Orders   CBC With Differential/Platelet   Ambulatory referral to Gastroenterology      Refer to GI as symptoms have been chronic   Follow up plan: Return if symptoms worsen or fail to improve, for f/u in 7-10 days.

## 2017-06-13 NOTE — Patient Instructions (Addendum)
Food Choices for Gastroesophageal Reflux Disease, Adult When you have gastroesophageal reflux disease (GERD), the foods you eat and your eating habits are very important. Choosing the right foods can help ease your discomfort. What guidelines do I need to follow?  Choose fruits, vegetables, whole grains, and low-fat dairy products.  Choose low-fat meat, fish, and poultry.  Limit fats such as oils, salad dressings, butter, nuts, and avocado.  Keep a food diary. This helps you identify foods that cause symptoms.  Avoid foods that cause symptoms. These may be different for everyone.  Eat small meals often instead of 3 large meals a day.  Eat your meals slowly, in a place where you are relaxed.  Limit fried foods.  Cook foods using methods other than frying.  Avoid drinking alcohol.  Avoid drinking large amounts of liquids with your meals.  Avoid bending over or lying down until 2-3 hours after eating. What foods are not recommended? These are some foods and drinks that may make your symptoms worse: Vegetables  Tomatoes. Tomato juice. Tomato and spaghetti sauce. Chili peppers. Onion and garlic. Horseradish. Fruits  Oranges, grapefruit, and lemon (fruit and juice). Meats  High-fat meats, fish, and poultry. This includes hot dogs, ribs, ham, sausage, salami, and bacon. Dairy  Whole milk and chocolate milk. Sour cream. Cream. Butter. Ice cream. Cream cheese. Drinks  Coffee and tea. Bubbly (carbonated) drinks or energy drinks. Condiments  Hot sauce. Barbecue sauce. Sweets/Desserts  Chocolate and cocoa. Donuts. Peppermint and spearmint. Fats and Oils  High-fat foods. This includes French fries and potato chips. Other  Vinegar. Strong spices. This includes black pepper, white pepper, red pepper, cayenne, curry powder, cloves, ginger, and chili powder. The items listed above may not be a complete list of foods and drinks to avoid. Contact your dietitian for more information.    This information is not intended to replace advice given to you by your health care provider. Make sure you discuss any questions you have with your health care provider. Document Released: 11/12/2011 Document Revised: 10/19/2015 Document Reviewed: 03/17/2013 Elsevier Interactive Patient Education  2017 Elsevier Inc.  

## 2017-06-14 LAB — TSH: TSH: 4.45 u[IU]/mL (ref 0.450–4.500)

## 2017-06-16 NOTE — Progress Notes (Signed)
Normal labs.  Pt notified through mychart

## 2017-06-18 ENCOUNTER — Telehealth: Payer: Self-pay | Admitting: Unknown Physician Specialty

## 2017-06-18 ENCOUNTER — Encounter: Payer: Self-pay | Admitting: Gastroenterology

## 2017-06-18 ENCOUNTER — Ambulatory Visit (INDEPENDENT_AMBULATORY_CARE_PROVIDER_SITE_OTHER): Payer: BC Managed Care – PPO | Admitting: Gastroenterology

## 2017-06-18 VITALS — BP 116/72 | HR 78 | Temp 98.2°F | Ht 65.3 in | Wt 136.0 lb

## 2017-06-18 DIAGNOSIS — K5900 Constipation, unspecified: Secondary | ICD-10-CM

## 2017-06-18 DIAGNOSIS — R112 Nausea with vomiting, unspecified: Secondary | ICD-10-CM

## 2017-06-18 DIAGNOSIS — Z1211 Encounter for screening for malignant neoplasm of colon: Secondary | ICD-10-CM | POA: Diagnosis not present

## 2017-06-18 DIAGNOSIS — K219 Gastro-esophageal reflux disease without esophagitis: Secondary | ICD-10-CM

## 2017-06-18 MED ORDER — PEG 3350-KCL-NA BICARB-NACL 420 G PO SOLR: 4000.0000 mL | Freq: Once | ORAL | 0 refills | Status: AC

## 2017-06-18 NOTE — Telephone Encounter (Signed)
Patient was sent a mychart message regarding labs on 06/16/17 by Malachy Mood. Called and left patient a VM letting her know what Cheryl's message read. Asked for her to please call back with any questions or concerns.

## 2017-06-18 NOTE — Progress Notes (Signed)
Jonathon Bellows MD, MRCP(U.K) [address]  [place]  [place], [place] [address]  Main: [phone]  Fax: [phone]   Gastroenterology Consultation  Referring Provider:     Kathrine Haddock, NP Primary Care Physician:  Kathrine Haddock, NP Primary Gastroenterologist:  Dr. Jonathon Bellows  Reason for Consultation: GERD and vomiting         HPI:   Laura Soto is a 57 y.o. y/o female referred for consultation & management  by Dr. Kathrine Haddock, NP.   She says that last week was not feeling well. Ate some red meat , on Thursday and induced vomiting and she was belching, when she threw up was brown and her stool was black. Last bowel movement was on Saturday. She was given some nausea pills and says her appetite is still not good. Denies any abdominal pain. Last used some ibuprofen for 3-4 weeks , when taken once every two weeks. She smokes. Occasionally she says her food gets stuck in her throat . Very rarely, solids and liquids. She also has heartburn .   Lost a few lbs last week. No family history of colon cancer or polyps. In addition has some aches in her head and her arms.   Never had a colonoscopy.   Past Medical History:  Diagnosis Date  . Abnormal mammogram, unspecified   . Breast screening, unspecified   . Hypercalcemia   . Hyperlipidemia   . Osteopenia   . Personal history of tobacco use, presenting hazards to health     Past Surgical History:  Procedure Laterality Date  . WISDOM TOOTH EXTRACTION      Prior to Admission medications   Medication Sig Start Date End Date Taking? Authorizing Provider  citalopram (CELEXA) 40 MG tablet Take 1 tablet (40 mg total) by mouth daily. 12/10/16  Yes Kathrine Haddock, NP  levothyroxine (SYNTHROID, LEVOTHROID) 100 MCG tablet Take 1 tablet (100 mcg total) by mouth daily. 05/07/17  Yes Kathrine Haddock, NP  omeprazole (PRILOSEC) 20 MG capsule Take 1 capsule (20 mg total) by mouth 2 (two) times daily before a meal. 06/13/17  Yes Kathrine Haddock, NP  fluticasone (FLONASE) 50 MCG/ACT nasal spray Place 2 sprays into both nostrils daily. Patient not taking: Reported on 06/18/2017 03/06/16   Kathrine Haddock, NP  ondansetron (ZOFRAN) 4 MG tablet Take 1 tablet (4 mg total) by mouth every 8 (eight) hours as needed for nausea or vomiting. Patient not taking: Reported on 06/18/2017 06/13/17   Kathrine Haddock, NP  oxybutynin (DITROPAN-XL) 5 MG 24 hr tablet Take 1 tablet (5 mg total) by mouth at bedtime. Patient not taking: Reported on 06/18/2017 01/30/17   Volney American, PA-C    Family History  Problem Relation Age of Onset  . Liver cancer Father        age 28  . Diabetes Father   . Stroke Mother   . Atrial fibrillation Mother      Social History   Tobacco Use  . Smoking status: Former Smoker    Packs/day: 0.25    Years: 20.00    Pack years: 5.00    Types: Cigarettes    Last attempt to quit: 12/26/2014    Years since quitting: 2.4  . Smokeless tobacco: Never Used  Substance Use Topics  . Alcohol use: Yes    Comment: occasionally - wine  . Drug use: No    Allergies as of 06/18/2017  . (No Known Allergies)    Review of Systems:  All systems reviewed and negative except where noted in HPI.   Physical Exam:  BP 116/72 (BP Location: Left Arm, Patient Position: Sitting, Cuff Size: Normal)   Pulse 78   Temp 98.2 F (36.8 C) (Oral)   Ht 5' 5.3" (1.659 m)   Wt 136 lb (61.7 kg)   LMP  (LMP Unknown)   BMI 22.42 kg/m  No LMP recorded (lmp unknown). Patient is postmenopausal. Psych:  Alert and cooperative. Normal mood and affect. General:   Alert,  Well-developed, well-nourished, pleasant and cooperative in NAD Head:  Normocephalic and atraumatic. Eyes:  Sclera clear, no icterus.   Conjunctiva pink. Ears:  Normal auditory acuity. Nose:  No deformity, discharge, or lesions. Mouth:  No deformity or lesions,oropharynx pink & moist. Neck:  Supple; no masses or thyromegaly. Lungs:  Respirations even and unlabored.   Clear throughout to auscultation.   No wheezes, crackles, or rhonchi. No acute distress. Heart:  Regular rate and rhythm; no murmurs, clicks, rubs, or gallops. Abdomen:  Normal bowel sounds.  No bruits.  Soft, non-tender and non-distended without masses, hepatosplenomegaly or hernias noted.  No guarding or rebound tenderness.    Msk:  Symmetrical without gross deformities. Good, equal movement & strength bilaterally. Pulses:  Normal pulses noted. Extremities:  No clubbing or edema.  No cyanosis. Neurologic:  Alert and oriented x3;  grossly normal neurologically. Skin:  Intact without significant lesions or rashes. No jaundice. Lymph Nodes:  No significant cervical adenopathy. Psych:  Alert and cooperative. Normal mood and affect.  Imaging Studies: No results found.  Assessment and Plan:   Laura Soto is a 57 y.o. y/o female has been referred for acute onset of nausea and vomiting. Her symptoms are suggestive if an episode of food posioning , possible post infectious ibs like symptoms . She definetly has some features of GERD and the episode of black stool was possibly from a mallory weiss tear. She is constipated presently which is new for her, she is 19 and has not had a colonoscopy   Plan 1. Miralax daily for a week for acute constipation  2. EGD+ colonoscopy- to evaluate for esophagitis and colon cancer screening   3. Continue taking the Prilosec for reflux.  4. Stop smoking  5. No NSAID's 6. Counseled on life style changes, suggest to use PPI first thing in the morning on empty stomach and eat 30 minutes after. Advised on the use of a wedge pillow at night , avoid meals for 2 hours prior to bed time. Weight loss  Discussed the risks and benefits of long term PPI use including but not limited to bone loss, chronic kidney disease, infections , low magnesium . Aim to use at the lowest dose for the shortest period of time   I have discussed alternative options, risks & benefits,  which  include, but are not limited to, bleeding, infection, perforation,respiratory complication & drug reaction.  The patient agrees with this plan & written consent will be obtained.     Follow up in 8-12 weeks   Dr Jonathon Bellows MD,MRCP(U.K)

## 2017-06-18 NOTE — Telephone Encounter (Signed)
Copied from [place] [id number]. Topic: Quick Communication - Lab Results >> Jun 18, 2017  9:42 AM Corie Chiquito, NT wrote: Patient calling for her lab results. If someone could give her a call back about this at [phone]. Call around 4:45-5. If someone calls before that and no answer please leave a msg

## 2017-06-24 ENCOUNTER — Ambulatory Visit: Payer: BC Managed Care – PPO | Admitting: Unknown Physician Specialty

## 2017-06-24 ENCOUNTER — Encounter: Payer: Self-pay | Admitting: Gastroenterology

## 2017-06-24 NOTE — Telephone Encounter (Signed)
error 

## 2017-06-25 ENCOUNTER — Encounter
Admission: RE | Disposition: A | Discharge status: Home / Self Care | Payer: Self-pay | Source: Ambulatory Visit | Attending: Gastroenterology

## 2017-06-25 ENCOUNTER — Ambulatory Visit
Admission: RE | Admit: 2017-06-25 | Discharge: 2017-06-25 | Disposition: A | Discharge status: Home / Self Care | Payer: BC Managed Care – PPO | Source: Ambulatory Visit | Attending: Gastroenterology | Admitting: Gastroenterology

## 2017-06-25 ENCOUNTER — Ambulatory Visit: Payer: BC Managed Care – PPO | Admitting: Anesthesiology

## 2017-06-25 DIAGNOSIS — Z79899 Other long term (current) drug therapy: Secondary | ICD-10-CM | POA: Insufficient documentation

## 2017-06-25 DIAGNOSIS — K219 Gastro-esophageal reflux disease without esophagitis: Secondary | ICD-10-CM | POA: Insufficient documentation

## 2017-06-25 DIAGNOSIS — Z1211 Encounter for screening for malignant neoplasm of colon: Secondary | ICD-10-CM | POA: Diagnosis not present

## 2017-06-25 DIAGNOSIS — K254 Chronic or unspecified gastric ulcer with hemorrhage: Secondary | ICD-10-CM | POA: Diagnosis not present

## 2017-06-25 DIAGNOSIS — Z7951 Long term (current) use of inhaled steroids: Secondary | ICD-10-CM | POA: Insufficient documentation

## 2017-06-25 DIAGNOSIS — E039 Hypothyroidism, unspecified: Secondary | ICD-10-CM | POA: Insufficient documentation

## 2017-06-25 DIAGNOSIS — D122 Benign neoplasm of ascending colon: Secondary | ICD-10-CM | POA: Diagnosis not present

## 2017-06-25 DIAGNOSIS — J449 Chronic obstructive pulmonary disease, unspecified: Secondary | ICD-10-CM | POA: Diagnosis not present

## 2017-06-25 DIAGNOSIS — F329 Major depressive disorder, single episode, unspecified: Secondary | ICD-10-CM | POA: Insufficient documentation

## 2017-06-25 DIAGNOSIS — F17210 Nicotine dependence, cigarettes, uncomplicated: Secondary | ICD-10-CM | POA: Diagnosis not present

## 2017-06-25 DIAGNOSIS — K921 Melena: Secondary | ICD-10-CM | POA: Diagnosis not present

## 2017-06-25 DIAGNOSIS — K259 Gastric ulcer, unspecified as acute or chronic, without hemorrhage or perforation: Secondary | ICD-10-CM | POA: Diagnosis not present

## 2017-06-25 HISTORY — DX: Major depressive disorder, single episode, unspecified: F32.9

## 2017-06-25 HISTORY — PX: ESOPHAGOGASTRODUODENOSCOPY (EGD) WITH PROPOFOL: SHX5813

## 2017-06-25 HISTORY — DX: Hypothyroidism, unspecified: E03.9

## 2017-06-25 HISTORY — DX: Depression, unspecified: F32.A

## 2017-06-25 HISTORY — PX: COLONOSCOPY WITH PROPOFOL: SHX5780

## 2017-06-25 HISTORY — DX: Gastro-esophageal reflux disease without esophagitis: K21.9

## 2017-06-25 SURGERY — COLONOSCOPY WITH PROPOFOL
Anesthesia: General

## 2017-06-25 MED ORDER — PROPOFOL 500 MG/50ML IV EMUL
INTRAVENOUS | Status: AC
  Filled 2017-06-25: qty 50

## 2017-06-25 MED ORDER — SODIUM CHLORIDE 0.9 % IV SOLN
INTRAVENOUS | Status: DC
  Administered 2017-06-25: 12:00:00 via INTRAVENOUS

## 2017-06-25 MED ORDER — IPRATROPIUM-ALBUTEROL 0.5-2.5 (3) MG/3ML IN SOLN
RESPIRATORY_TRACT | Status: AC
  Administered 2017-06-25: 3 mL
  Filled 2017-06-25: qty 3

## 2017-06-25 MED ORDER — IPRATROPIUM-ALBUTEROL 0.5-2.5 (3) MG/3ML IN SOLN: 3.0000 mL | Freq: Four times a day (QID) | RESPIRATORY_TRACT | Status: DC

## 2017-06-25 MED ORDER — PROPOFOL 10 MG/ML IV BOLUS
INTRAVENOUS | Status: DC | PRN
  Administered 2017-06-25: 80 mg via INTRAVENOUS

## 2017-06-25 MED ORDER — PROPOFOL 500 MG/50ML IV EMUL
INTRAVENOUS | Status: DC | PRN
  Administered 2017-06-25: 125 ug/kg/min via INTRAVENOUS

## 2017-06-25 NOTE — Anesthesia Preprocedure Evaluation (Addendum)
Anesthesia Evaluation  Patient identified by MRN, date of birth, ID band Patient awake    Reviewed: Allergy & Precautions, H&P , NPO status , Patient's Chart, lab work & pertinent test results, reviewed documented beta blocker date and time   History of Anesthesia Complications (+) Emergence Delirium and history of anesthetic complications  Airway Mallampati: II   Neck ROM: full    Dental  (+) Poor Dentition   Pulmonary COPD, Current Smoker,    Pulmonary exam normal        Cardiovascular negative cardio ROS Normal cardiovascular exam Rhythm:regular Rate:Normal     Neuro/Psych PSYCHIATRIC DISORDERS negative neurological ROS     GI/Hepatic Neg liver ROS, GERD  Medicated and Controlled,  Endo/Other  Hypothyroidism   Renal/GU negative Renal ROS  negative genitourinary   Musculoskeletal   Abdominal   Peds  Hematology negative hematology ROS (+)   Anesthesia Other Findings Past Medical History: No date: Abnormal mammogram, unspecified No date: Breast screening, unspecified No date: Depression No date: GERD (gastroesophageal reflux disease) No date: Hypercalcemia No date: Hyperlipidemia No date: Hypothyroidism No date: Osteopenia No date: Personal history of tobacco use, presenting hazards to health Past Surgical History: No date: WISDOM TOOTH EXTRACTION   Reproductive/Obstetrics negative OB ROS                            Anesthesia Physical Anesthesia Plan  ASA: III  Anesthesia Plan: General   Post-op Pain Management:    Induction: Intravenous  PONV Risk Score and Plan:   Airway Management Planned: Natural Airway and Nasal Cannula  Additional Equipment:   Intra-op Plan:   Post-operative Plan:   Informed Consent: I have reviewed the patients History and Physical, chart, labs and discussed the procedure including the risks, benefits and alternatives for the proposed  anesthesia with the patient or authorized representative who has indicated his/her understanding and acceptance.   Dental Advisory Given  Plan Discussed with: CRNA  Anesthesia Plan Comments: (Patient consented for risks of anesthesia including but not limited to:  - adverse reactions to medications - risk of intubation if required - damage to teeth, lips or other oral mucosa - sore throat or hoarseness - Damage to heart, brain, lungs or loss of life  Patient voiced understanding.)       Anesthesia Quick Evaluation

## 2017-06-25 NOTE — Transfer of Care (Signed)
Immediate Anesthesia Transfer of Care Note  Patient: Laura Soto  Procedure(s) Performed: COLONOSCOPY WITH PROPOFOL (N/A ) ESOPHAGOGASTRODUODENOSCOPY (EGD) WITH PROPOFOL (N/A )  Patient Location: PACU  Anesthesia Type:General  Level of Consciousness: sedated  Airway & Oxygen Therapy: Patient Spontanous Breathing and Patient connected to nasal cannula oxygen  Post-op Assessment: Report given to RN and Post -op Vital signs reviewed and stable  Post vital signs: Reviewed and stable  Last Vitals:  Vitals:   06/25/17 1215  BP: 137/85  Pulse: 65  Resp: 20  Temp: (!) 36.3 C  SpO2: 100%    Last Pain:  Vitals:   06/25/17 1215  TempSrc: Tympanic         Complications: No apparent anesthesia complications

## 2017-06-25 NOTE — Op Note (Signed)
[facility] Gastroenterology Patient Name: Laura Soto Procedure Date: 06/25/2017 12:45 PM MRN: [id number] Account #: [id number] Date of Birth: 08/27/1960 Admit Type: Outpatient Age: 57 Room: [facility] ENDO ROOM 1 Gender: Female Note Status: Finalized Procedure:            Upper GI endoscopy Indications:          Melena Providers:            Jonathon Bellows MD, MD Medicines:            Monitored Anesthesia Care Complications:        No immediate complications. Procedure:            Pre-Anesthesia Assessment:                       - Prior to the procedure, a History and Physical was                        performed, and patient medications, allergies and                        sensitivities were reviewed. The patient's tolerance of                        previous anesthesia was reviewed.                       - The risks and benefits of the procedure and the                        sedation options and risks were discussed with the                        patient. All questions were answered and informed                        consent was obtained.                       - ASA Grade Assessment: III - A patient with severe                        systemic disease.                       After obtaining informed consent, the endoscope was                        passed under direct vision. Throughout the procedure,                        the patient's blood pressure, pulse, and oxygen                        saturations were monitored continuously. The Endoscope                        was introduced through the mouth, and advanced to the                        third part of duodenum. The upper GI endoscopy was  accomplished with ease. The patient tolerated the                        procedure well. Findings:      The esophagus was normal.      The examined duodenum was normal.      Two non-bleeding superficial gastric ulcers with no stigmata of bleeding    were found in the gastric antrum. The largest lesion was 7 mm in largest       dimension. Biopsies were taken with a cold forceps for histology. Impression:           - Normal esophagus.                       - Normal examined duodenum.                       - Non-bleeding gastric ulcers with no stigmata of                        bleeding. Biopsied. Recommendation:       - Await pathology results.                       - 1. NO NSAID;s                       2. Commence on Prilosec OTC 40 mg once daily                       3. Repeat EGD in 8 weeks to check for healing of the                        ulcers                       - Perform a colonoscopy today. Procedure Code(s):    --- Professional ---                       [phone], Esophagogastroduodenoscopy, flexible, transoral;                        with biopsy, single or multiple Diagnosis Code(s):    --- Professional ---                       K25.9, Gastric ulcer, unspecified as acute or chronic,                        without hemorrhage or perforation                       K92.1, Melena (includes Hematochezia) CPT copyright 2016 American Medical Association. All rights reserved. The codes documented in this report are preliminary and upon coder review may  be revised to meet current compliance requirements. Jonathon Bellows, MD Jonathon Bellows MD, MD 06/25/2017 1:01:22 PM This report has been signed electronically. Number of Addenda: 0 Note Initiated On: 06/25/2017 12:45 PM      [facility]

## 2017-06-25 NOTE — H&P (Signed)
Jonathon Bellows, MD [address], [place], [place], [place], [address] [address], [place], [place], [place], [address] Phone: [phone]  Fax: [phone]  Primary Care Physician:  Kathrine Haddock, NP   Pre-Procedure History & Physical: HPI:  Laura Soto is a 57 y.o. female is here for an endoscopy and colonoscopy    Past Medical History:  Diagnosis Date  . Abnormal mammogram, unspecified   . Breast screening, unspecified   . Depression   . GERD (gastroesophageal reflux disease)   . Hypercalcemia   . Hyperlipidemia   . Hypothyroidism   . Osteopenia   . Personal history of tobacco use, presenting hazards to health     Past Surgical History:  Procedure Laterality Date  . WISDOM TOOTH EXTRACTION      Prior to Admission medications   Medication Sig Start Date End Date Taking? Authorizing Provider  citalopram (CELEXA) 40 MG tablet Take 1 tablet (40 mg total) by mouth daily. 12/10/16  Yes Kathrine Haddock, NP  fluticasone (FLONASE) 50 MCG/ACT nasal spray Place 2 sprays into both nostrils daily. 03/06/16  Yes Kathrine Haddock, NP  levothyroxine (SYNTHROID, LEVOTHROID) 100 MCG tablet Take 1 tablet (100 mcg total) by mouth daily. 05/07/17  Yes Kathrine Haddock, NP  omeprazole (PRILOSEC) 20 MG capsule Take 1 capsule (20 mg total) by mouth 2 (two) times daily before a meal. 06/13/17  Yes Kathrine Haddock, NP  ondansetron (ZOFRAN) 4 MG tablet Take 1 tablet (4 mg total) by mouth every 8 (eight) hours as needed for nausea or vomiting. 06/13/17  Yes Kathrine Haddock, NP  oxybutynin (DITROPAN-XL) 5 MG 24 hr tablet Take 1 tablet (5 mg total) by mouth at bedtime. 01/30/17  Yes Volney American, PA-C    Allergies as of 06/18/2017  . (No Known Allergies)    Family History  Problem Relation Age of Onset  . Liver cancer Father        age 64  . Diabetes Father   . Stroke Mother   . Atrial fibrillation Mother     Social History   Socioeconomic History  . Marital status: Divorced      Spouse name: Not on file  . Number of children: Not on file  . Years of education: Not on file  . Highest education level: Not on file  Social Needs  . Financial resource strain: Not on file  . Food insecurity - worry: Not on file  . Food insecurity - inability: Not on file  . Transportation needs - medical: Not on file  . Transportation needs - non-medical: Not on file  Occupational History  . Not on file  Tobacco Use  . Smoking status: Current Some Day Smoker    Packs/day: 0.25    Years: 20.00    Pack years: 5.00    Types: Cigarettes    Last attempt to quit: 12/26/2014    Years since quitting: 2.4  . Smokeless tobacco: Never Used  Substance and Sexual Activity  . Alcohol use: Yes    Comment: occasionally - wine. none last 24hrs  . Drug use: No  . Sexual activity: No  Other Topics Concern  . Not on file  Social History Narrative  . Not on file    Review of Systems: See HPI, otherwise negative ROS  Physical Exam: BP 137/85   Pulse 65   Temp (!) 97.4 F (36.3 C) (Tympanic)   Resp 20   Ht 5\' 5"  (1.651 m)   Wt 126  lb (57.2 kg)   LMP  (LMP Unknown)   SpO2 100%   BMI 20.97 kg/m  General:   Alert,  pleasant and cooperative in NAD Head:  Normocephalic and atraumatic. Neck:  Supple; no masses or thyromegaly. Lungs:  Clear throughout to auscultation, normal respiratory effort.    Heart:  +S1, +S2, Regular rate and rhythm, No edema. Abdomen:  Soft, nontender and nondistended. Normal bowel sounds, without guarding, and without rebound.   Neurologic:  Alert and  oriented x4;  grossly normal neurologically.  Impression/Plan: Laura Soto is here for an endoscopy and colonoscopy  to be performed for  evaluation of melena and colon cancer screening     Risks, benefits, limitations, and alternatives regarding endoscopy have been reviewed with the patient.  Questions have been answered.  All parties agreeable.   Jonathon Bellows, MD  06/25/2017, 12:46 PM

## 2017-06-25 NOTE — Anesthesia Post-op Follow-up Note (Signed)
Anesthesia QCDR form completed.        

## 2017-06-25 NOTE — Op Note (Signed)
[facility] Gastroenterology Patient Name: Laura Soto Procedure Date: 06/25/2017 12:45 PM MRN: [id number] Account #: [id number] Date of Birth: 03/12/1961 Admit Type: Outpatient Age: 57 Room: [facility] ENDO ROOM 1 Gender: Female Note Status: Finalized Procedure:            Colonoscopy Indications:          Screening for colorectal malignant neoplasm Providers:            Jonathon Bellows MD, MD Medicines:            Monitored Anesthesia Care Complications:        No immediate complications. Procedure:            Pre-Anesthesia Assessment:                       - Prior to the procedure, a History and Physical was                        performed, and patient medications, allergies and                        sensitivities were reviewed. The patient's tolerance of                        previous anesthesia was reviewed.                       - The risks and benefits of the procedure and the                        sedation options and risks were discussed with the                        patient. All questions were answered and informed                        consent was obtained.                       - ASA Grade Assessment: III - A patient with severe                        systemic disease.                       After obtaining informed consent, the colonoscope was                        passed under direct vision. Throughout the procedure,                        the patient's blood pressure, pulse, and oxygen                        saturations were monitored continuously. The                        Colonoscope was introduced through the anus and                        advanced to the the cecum, identified by the  appendiceal orifice, IC valve and transillumination.                        The colonoscopy was performed with ease. The patient                        tolerated the procedure well. The quality of the bowel                        preparation  was adequate. Findings:      The perianal and digital rectal examinations were normal.      A 20 mm polyp was found in the proximal ascending colon. The polyp was       sessile. Preparations were made for mucosal resection. 7 mL of Eleview       was injected with adequate lift of the lesion from the muscularis       propria. Snare mucosal resection with snare retrieval was performed. A       23 mm area was resected. Three pieces were resected in total. Resection       was complete, and retrieval was complete. There was no bleeding during,       and at the end, of the procedure. To prevent bleeding after mucosal       resection, five hemostatic clips were successfully placed. There was no       bleeding during, or at the end, of the procedure.      The exam was otherwise without abnormality. Impression:           - One 20 mm polyp in the proximal ascending colon,                        removed with mucosal resection. Resected and retrieved.                        Clips were placed.                       - The examination was otherwise normal.                       - Mucosal resection was performed. Resection was                        complete, and retrieval was complete. Recommendation:       - Discharge patient to home (with escort).                       - Resume previous diet.                       - Continue present medications.                       - No ibuprofen, naproxen, or other non-steroidal                        anti-inflammatory drugs for 6 weeks after polyp removal. Procedure Code(s):    --- Professional ---                       [phone], Colonoscopy, flexible; with endoscopic mucosal  resection Diagnosis Code(s):    --- Professional ---                       Z12.11, Encounter for screening for malignant neoplasm                        of colon                       D12.2, Benign neoplasm of ascending colon CPT copyright 2016 American Medical  Association. All rights reserved. The codes documented in this report are preliminary and upon coder review may  be revised to meet current compliance requirements. Jonathon Bellows, MD Jonathon Bellows MD, MD 06/25/2017 1:44:43 PM This report has been signed electronically. Number of Addenda: 0 Note Initiated On: 06/25/2017 12:45 PM Scope Withdrawal Time: 0 hours 31 minutes 55 seconds  Total Procedure Duration: 0 hours 38 minutes 32 seconds       [facility]

## 2017-06-26 ENCOUNTER — Telehealth: Payer: Self-pay | Admitting: Unknown Physician Specialty

## 2017-06-26 ENCOUNTER — Encounter: Payer: Self-pay | Admitting: Gastroenterology

## 2017-06-26 LAB — SURGICAL PATHOLOGY

## 2017-06-26 NOTE — Telephone Encounter (Signed)
Copied from [place] [id number]. Topic: Inquiry >> Jun 26, 2017 10:37 AM Cecelia Byars, NT wrote: Reason for CRM: Patient would like something called in for cold and sinus  she prefers an antibiotic ,please advise [phone]

## 2017-06-27 ENCOUNTER — Telehealth: Payer: Self-pay | Admitting: Unknown Physician Specialty

## 2017-06-27 MED ORDER — AMOXICILLIN-POT CLAVULANATE 875-125 MG PO TABS: 1.0000 | ORAL_TABLET | Freq: Two times a day (BID) | ORAL | 0 refills | Status: DC

## 2017-06-27 NOTE — Telephone Encounter (Signed)
Patient needs antibiotic sent to [place] patient spoke with [place]..  Thanks

## 2017-06-27 NOTE — Anesthesia Postprocedure Evaluation (Signed)
Anesthesia Post Note  Patient: Laura Soto  Procedure(s) Performed: COLONOSCOPY WITH PROPOFOL (N/A ) ESOPHAGOGASTRODUODENOSCOPY (EGD) WITH PROPOFOL (N/A )  Patient location during evaluation: PACU Anesthesia Type: General Level of consciousness: awake and alert Pain management: pain level controlled Vital Signs Assessment: post-procedure vital signs reviewed and stable Respiratory status: spontaneous breathing, nonlabored ventilation, respiratory function stable and patient connected to nasal cannula oxygen Cardiovascular status: blood pressure returned to baseline and stable Postop Assessment: no apparent nausea or vomiting Anesthetic complications: no     Last Vitals:  Vitals:   06/25/17 1405 06/25/17 1415  BP: (!) 147/84 (!) 142/74  Pulse:    Resp:    Temp:    SpO2:      Last Pain:  Vitals:   06/26/17 0822  TempSrc:   PainSc: 0-No pain                 Molli Barrows

## 2017-06-27 NOTE — Telephone Encounter (Signed)
Please call and schedule appointment per provider. Thanks.

## 2017-06-27 NOTE — Telephone Encounter (Signed)
We would like to see patients for antibiotic requests

## 2017-07-02 NOTE — Telephone Encounter (Signed)
Patient spoke Santiago Glad from front office. Refer to note for 06/27/2016.

## 2017-07-14 ENCOUNTER — Telehealth: Payer: Self-pay | Admitting: Gastroenterology

## 2017-07-14 NOTE — Telephone Encounter (Signed)
Patient called & would like resulets from procedure done 06-25-17 by Dr Vicente Males

## 2017-07-15 ENCOUNTER — Other Ambulatory Visit: Payer: Self-pay

## 2017-07-15 ENCOUNTER — Encounter: Payer: Self-pay | Admitting: Gastroenterology

## 2017-07-15 DIAGNOSIS — K257 Chronic gastric ulcer without hemorrhage or perforation: Secondary | ICD-10-CM

## 2017-07-30 ENCOUNTER — Ambulatory Visit: Payer: Self-pay | Admitting: Gastroenterology

## 2017-08-19 ENCOUNTER — Telehealth: Payer: Self-pay | Admitting: Gastroenterology

## 2017-08-19 NOTE — Telephone Encounter (Signed)
Pt wants to cancel her procedure for 08/25/17 and does not want to r/s at this time

## 2017-08-22 ENCOUNTER — Encounter: Payer: Self-pay | Admitting: *Deleted

## 2017-08-25 ENCOUNTER — Encounter: Admission: RE | Payer: Self-pay | Source: Ambulatory Visit

## 2017-08-25 ENCOUNTER — Ambulatory Visit
Admission: RE | Admit: 2017-08-25 | Payer: BC Managed Care – PPO | Source: Ambulatory Visit | Admitting: Gastroenterology

## 2017-08-25 SURGERY — ESOPHAGOGASTRODUODENOSCOPY (EGD) WITH PROPOFOL
Anesthesia: General

## 2017-10-15 ENCOUNTER — Other Ambulatory Visit: Payer: Self-pay

## 2017-10-15 DIAGNOSIS — E039 Hypothyroidism, unspecified: Secondary | ICD-10-CM

## 2017-10-15 MED ORDER — LEVOTHYROXINE SODIUM 100 MCG PO TABS: 100.0000 ug | ORAL_TABLET | Freq: Every day | ORAL | 1 refills | Status: DC

## 2017-10-15 NOTE — Telephone Encounter (Signed)
Patient last seen 06/13/17.

## 2017-10-31 ENCOUNTER — Ambulatory Visit: Payer: BC Managed Care – PPO | Admitting: Unknown Physician Specialty

## 2017-10-31 ENCOUNTER — Encounter: Payer: Self-pay | Admitting: Unknown Physician Specialty

## 2017-10-31 VITALS — BP 127/74 | HR 74 | Temp 98.4°F | Ht 65.0 in | Wt 140.4 lb

## 2017-10-31 DIAGNOSIS — L03114 Cellulitis of left upper limb: Secondary | ICD-10-CM | POA: Diagnosis not present

## 2017-10-31 MED ORDER — DOXYCYCLINE HYCLATE 100 MG PO TABS: 100.0000 mg | ORAL_TABLET | Freq: Two times a day (BID) | ORAL | 0 refills | Status: DC
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
## 2017-11-15 ENCOUNTER — Other Ambulatory Visit: Payer: Self-pay | Admitting: Unknown Physician Specialty

## 2017-11-15 DIAGNOSIS — F329 Major depressive disorder, single episode, unspecified: Secondary | ICD-10-CM

## 2017-11-15 DIAGNOSIS — F419 Anxiety disorder, unspecified: Principal | ICD-10-CM

## 2017-11-17 NOTE — Telephone Encounter (Signed)
citalopram refill Last Refill:12/10/16 # 90 3 RF Last OV: 08/16/16 PCP: Kathrine Haddock NP Pharmacy:[facility]

## 2018-01-07 ENCOUNTER — Ambulatory Visit: Payer: BC Managed Care – PPO | Admitting: Physician Assistant

## 2018-01-07 ENCOUNTER — Encounter: Payer: Self-pay | Admitting: Physician Assistant

## 2018-01-07 VITALS — BP 129/79 | HR 76 | Temp 99.3°F | Ht 65.0 in | Wt 141.4 lb

## 2018-01-07 DIAGNOSIS — F329 Major depressive disorder, single episode, unspecified: Secondary | ICD-10-CM | POA: Diagnosis not present

## 2018-01-07 DIAGNOSIS — F419 Anxiety disorder, unspecified: Secondary | ICD-10-CM

## 2018-01-07 DIAGNOSIS — F32A Depression, unspecified: Secondary | ICD-10-CM

## 2018-01-07 DIAGNOSIS — E039 Hypothyroidism, unspecified: Secondary | ICD-10-CM

## 2018-01-07 DIAGNOSIS — K219 Gastro-esophageal reflux disease without esophagitis: Secondary | ICD-10-CM | POA: Diagnosis not present

## 2018-01-07 MED ORDER — OMEPRAZOLE 20 MG PO CPDR: 20.0000 mg | DELAYED_RELEASE_CAPSULE | Freq: Two times a day (BID) | ORAL | 0 refills | Status: DC

## 2018-01-07 MED ORDER — ONDANSETRON HCL 4 MG PO TABS: 4.0000 mg | ORAL_TABLET | Freq: Three times a day (TID) | ORAL | 0 refills | Status: DC | PRN

## 2018-01-07 MED ORDER — CITALOPRAM HYDROBROMIDE 40 MG PO TABS: 40.0000 mg | ORAL_TABLET | Freq: Every day | ORAL | 0 refills | Status: DC

## 2018-01-07 MED ORDER — LEVOTHYROXINE SODIUM 100 MCG PO TABS: 100.0000 ug | ORAL_TABLET | Freq: Every day | ORAL | 0 refills | Status: DC

## 2018-01-07 NOTE — Progress Notes (Signed)
Subjective:    Patient ID: Laura Soto, female    DOB: [birth year], 59 y.o.   MRN: [id number]  Laura Soto is a 57 y.o. female presenting on 01/07/2018 for Depression and Hypothyroidism   HPI   Depression: Stable on Celexa 40 mg.   GERD: History of ulcers, takes omeprazole and zofran for this which is stable.   Hypothyroidism: TSH normal 05/2017 and she is taking 100 mcg synthroid without issue.   Social History   Tobacco Use  . Smoking status: Current Some Day Smoker    Packs/day: 0.25    Years: 20.00    Pack years: 5.00    Types: Cigarettes    Last attempt to quit: 12/26/2014    Years since quitting: 3.0  . Smokeless tobacco: Never Used  Substance Use Topics  . Alcohol use: Yes    Comment: occasionally - wine. none last 24hrs  . Drug use: No    Review of Systems Per HPI unless specifically indicated above     Objective:    BP 129/79   Pulse 76   Temp 99.3 F (37.4 C) (Oral)   Ht _0  (1.651 m)   Wt 141 lb 6.4 oz (64.1 kg)   LMP  (LMP Unknown)   SpO2 97%   BMI 23.53 kg/m   Wt Readings from Last 3 Encounters:  01/07/18 141 lb 6.4 oz (64.1 kg)  10/31/17 140 lb 6.4 oz (63.7 kg)  06/25/17 126 lb (57.2 kg)    Physical Exam  Constitutional: She is oriented to person, place, and time. She appears well-developed and well-nourished.  Neck: Neck supple. No thyromegaly present.  Cardiovascular: Normal rate and regular rhythm.  Pulmonary/Chest: Effort normal and breath sounds normal.  Neurological: She is alert and oriented to person, place, and time.  Skin: Skin is warm and dry.  Psychiatric: She has a normal mood and affect. Her behavior is normal.   Results for orders placed or performed during the hospital encounter of 06/25/17  Surgical pathology  Result Value Ref Range   SURGICAL PATHOLOGY      Surgical Pathology CASE: [id number] PATIENT: Jake Seats Surgical Pathology Report     SPECIMEN SUBMITTED: A. Stomach ulcer 1; cbx B. Stomach ulcer  2; cbx C. Colon polyp, ascending; hot snare  CLINICAL HISTORY: None provided  PRE-OPERATIVE DIAGNOSIS: GERD, K21.9, screening colonoscopy Z12.11  POST-OPERATIVE DIAGNOSIS: Gastric ulcers x2, colon polyp     DIAGNOSIS: A. STOMACH ULCER #1; COLD BIOPSY: - ANTRAL MUCOSA WITH CHANGES CONSISTENT WITH HEALING MUCOSAL INJURY. - NEGATIVE FOR H. PYLORI, DYSPLASIA, AND MALIGNANCY.  B. STOMACH ULCER #2; COLD BIOPSY: - ANTRAL MUCOSA WITH CHANGES CONSISTENT WITH HEALING MUCOSAL INJURY. - NEGATIVE FOR H. PYLORI, DYSPLASIA, AND MALIGNANCY.  C. COLON POLYP, ASCENDING; HOT SNARE: - SESSILE SERRATED ADENOMA. - NEGATIVE FOR HIGH-GRADE DYSPLASIA AND MALIGNANCY.  Comment: Sessile serrated adenomas (SSAs) are often difficult to detect endoscopically as they are typically broad flat lesions found in the proximal colon.  By morphologic definition, SSAs demonstrate architectural (low grade) dysplasia, with or without concurrent cytologic dysplasia. SSAs are thought to be precursor lesions to a subset of colonic adenocarcinomas that arise through the serrated neoplastic pathway rather than the classical neoplasia pathway. Lesions of the serrated pathway have a high frequency of BRAF mutation and are more likely to be microsatellite unstable compared to tubular / villous adenomas of the classical pathway.  The recommended surveillance interval for a SSA <10 mm with no cytologic dysplasia is 5 years.  A SSA ?10 mm and a sessile serrated polyp with cytological dysplasia should be managed like a high risk adenoma (recommended surveillance interval of 3 years).  Serrated polyposis syndrome, as defined by WHO, should be considered with one of the following criteria: (1) at least 5 serrated polyps proximal to sigmoid, with 2 or more ?10 mm; (2) any serrated polyps proximal to sigmoid with f amily history of serrated polyposis syndrome; and (3) >20 serrated polyps of any size throughout the  colon.  References: Guidelines for Colonoscopy Surveillance After Screening and Polypectomy: A Consensus Update by the [place] Multi-Society Task Force on Colorectal Cancer. [place] [employer], 2012.    GROSS DESCRIPTION:  A. Labeled: C BX gastric ulcer #1  Tissue fragment(s): 1  Size: 0.5 cm  Description: in formalin, pink-tan fragment  Entirely submitted in one cassette(s).   B. Labeled: C BX gastric ulcer #2  Tissue fragment(s): 3  Size: 0.3-0.4 cm  Description: in formalin, pink-tan fragments  Entirely submitted in 1 cassette(s).  C. Labeled: hot snare polyp ascending colon  Tissue fragment(s): 4  Size: aggregate, 1.2 x 0.6 x 0.4 cm  Description: in formalin, yellow focally blue tinged fragments of tissue, 2 largest fragments differentially inked and bisected  Entirely submitted in 1 cassette(s).  Final Diagnosis performed by  Quay Burow, MD.  Electronically signed 06/26/2017 11:07:16AM    The electronic signature indicates that the named Attending Pathologist has evaluated the specimen  Technical component performed at [facility], [address], [place], [place] [address] Lab: [phone] Dir: Rush Farmer, MD, MMM  Professional component performed at [facility], [facility], [place], [place], [place] [address] Lab: [phone] Dir: Dellia Nims. Reuel Derby, MD        Assessment & Plan:  1. Anxiety and depression  - citalopram (CELEXA) 40 MG tablet; Take 1 tablet (40 mg total) by mouth daily.  Dispense: 90 tablet; Refill: 0  2. Gastroesophageal reflux disease without esophagitis  Warned of risk of prolonged QT syndrome, patient has been stable on Celexa and this medication.   - omeprazole (PRILOSEC) 20 MG capsule; Take 1 capsule (20 mg total) by mouth 2 (two) times daily before a meal.  Dispense: 90 capsule; Refill: 0 - ondansetron (ZOFRAN) 4 MG tablet; Take 1 tablet (4 mg total) by mouth every 8 (eight) hours  as needed for nausea or vomiting.  Dispense: 30 tablet; Refill: 0  3. Hypothyroidism, unspecified type  - levothyroxine (SYNTHROID, LEVOTHROID) 100 MCG tablet; Take 1 tablet (100 mcg total) by mouth daily.  Dispense: 90 tablet; Refill: 0    Follow up plan: Return in about 6 months (around 07/10/2018) for Chronic follow up .  [facility] 01/07/2018, 1:44 PM

## 2018-01-08 ENCOUNTER — Ambulatory Visit: Payer: BC Managed Care – PPO | Admitting: Physician Assistant

## 2018-01-14 ENCOUNTER — Ambulatory Visit: Payer: BC Managed Care – PPO | Admitting: Physician Assistant

## 2018-03-17 ENCOUNTER — Other Ambulatory Visit: Payer: Self-pay | Admitting: Physician Assistant

## 2018-03-17 DIAGNOSIS — K219 Gastro-esophageal reflux disease without esophagitis: Secondary | ICD-10-CM

## 2018-03-17 NOTE — Telephone Encounter (Signed)
Patient needs a refill sent to [facility] for her omeprazole 90 day supply. She was supposed to have this sent at her last visit but only a 30 day supply was given. She has her regular 6 month appointment scheduled for  Feb, 2020.  Thank you Santiago Glad

## 2018-07-13 ENCOUNTER — Other Ambulatory Visit: Payer: Self-pay

## 2018-07-13 ENCOUNTER — Ambulatory Visit: Payer: BC Managed Care – PPO | Admitting: Nurse Practitioner

## 2018-07-13 ENCOUNTER — Encounter: Payer: Self-pay | Admitting: Nurse Practitioner

## 2018-07-13 VITALS — BP 129/81 | HR 73 | Temp 98.4°F | Ht 65.0 in | Wt 142.0 lb

## 2018-07-13 DIAGNOSIS — K219 Gastro-esophageal reflux disease without esophagitis: Secondary | ICD-10-CM | POA: Diagnosis not present

## 2018-07-13 DIAGNOSIS — F419 Anxiety disorder, unspecified: Secondary | ICD-10-CM | POA: Diagnosis not present

## 2018-07-13 DIAGNOSIS — E039 Hypothyroidism, unspecified: Secondary | ICD-10-CM | POA: Diagnosis not present

## 2018-07-13 DIAGNOSIS — F329 Major depressive disorder, single episode, unspecified: Secondary | ICD-10-CM

## 2018-07-13 DIAGNOSIS — Z1231 Encounter for screening mammogram for malignant neoplasm of breast: Secondary | ICD-10-CM

## 2018-07-13 MED ORDER — LEVOTHYROXINE SODIUM 100 MCG PO TABS: 100.0000 ug | ORAL_TABLET | Freq: Every day | ORAL | 3 refills | Status: DC

## 2018-07-13 MED ORDER — CITALOPRAM HYDROBROMIDE 40 MG PO TABS: 40.0000 mg | ORAL_TABLET | Freq: Every day | ORAL | 3 refills | Status: DC

## 2018-07-13 MED ORDER — OMEPRAZOLE 20 MG PO CPDR: 20.0000 mg | DELAYED_RELEASE_CAPSULE | Freq: Two times a day (BID) | ORAL | 3 refills | Status: DC

## 2018-07-13 NOTE — Progress Notes (Signed)
BP 129/81   Pulse 73   Temp 98.4 F (36.9 C) (Oral)   Ht 5' 5"  (1.651 m)   Wt 142 lb (64.4 kg)   LMP  (LMP Unknown)   SpO2 99%   BMI 23.63 kg/m    Subjective:    Patient ID: [name], female    DOB: 12/24/1960, 58 y.o.   MRN: [id number]  HPI: Laura Soto is a 58 y.o. female presents for follow-up  Chief Complaint  Patient presents with  . Gastroesophageal Reflux    35mf/u  . Hypothyroidism  . Anxiety   GERD Continues of Prilosec 20 MG. GERD control status: stable  Satisfied with current treatment? yes Heartburn frequency: none Medication side effects: no  Medication compliance: good Previous GERD medications: Antacid use frequency:   Duration:  Nature:  Location:  Heartburn duration:  Alleviatiating factors:   Aggravating factors:  Dysphagia: no Odynophagia:  no Hematemesis: no Blood in stool: no EGD: yes   HYPOTHYROIDISM Currently taking Levothyroxine 100 MCG a day.  Last TSH 4.450 06/13/17. Thyroid control status:stable Satisfied with current treatment? yes Medication side effects: no Medication compliance: good compliance Etiology of hypothyroidism:  Recent dose adjustment:no Fatigue: no Cold intolerance: no Heat intolerance: no Weight gain: no Weight loss: no Constipation: no Diarrhea/loose stools: no Palpitations: no Lower extremity edema: no Anxiety/depressed mood: no   ANXIETY/STRESS Continues on Celexa daily. Duration:stable Anxious mood: no  Excessive worrying: no Irritability: no  Sweating: no Nausea: no Palpitations:no Hyperventilation: no Panic attacks: no Agoraphobia: no  Obscessions/compulsions: no Depressed mood: no [facility] 07/13/2018 01/07/2018 10/31/2017 08/16/2016 05/01/2016  Decreased Interest 0 1 0 0 0  Down, Depressed, Hopeless 0 0 0 0 0  PHQ - 2 Score 0 1 0 0 0  Altered sleeping 0 0 0 - -  Tired, decreased energy 0 0 0 - -  Change in appetite 0 0 0 - -  Feeling bad or failure about  yourself  0 0 0 - -  Trouble concentrating 0 0 0 - -  Moving slowly or fidgety/restless 0 0 0 - -  Suicidal thoughts 0 0 0 - -  PHQ-9 Score 0 1 0 - -  Difficult doing work/chores Not difficult at all - - - -   Anhedonia: no Weight changes: no Insomnia: none, takes Melatonin Hypersomnia: no Fatigue/loss of energy: no Feelings of worthlessness: no Feelings of guilt: no Impaired concentration/indecisiveness: no Suicidal ideations: no  Crying spells: no Recent Stressors/Life Changes: no   Relationship problems: no   Family stress: no     Financial stress: no    Job stress: no    Recent death/loss: no  She refuses lipid panel today, on review no lipid panel since 2018. Reports she will have this performed at next visit.    Relevant past medical, surgical, family and social history reviewed and updated as indicated. Interim medical history since our last visit reviewed. Allergies and medications reviewed and updated.  Review of Systems  Constitutional: Negative for activity change, appetite change, diaphoresis, fatigue and fever.  Respiratory: Negative for cough, chest tightness and shortness of breath.   Cardiovascular: Negative for chest pain, palpitations and leg swelling.  Gastrointestinal: Negative for abdominal distention, abdominal pain, constipation, diarrhea, nausea and vomiting.  Endocrine: Negative for cold intolerance, heat intolerance, polydipsia, polyphagia and polyuria.  Neurological: Negative for dizziness, syncope, weakness, light-headedness, numbness and headaches.  Psychiatric/Behavioral: Negative.     Per HPI unless specifically indicated above  Objective:    BP 129/81   Pulse 73   Temp 98.4 F (36.9 C) (Oral)   Ht 5' 5"  (1.651 m)   Wt 142 lb (64.4 kg)   LMP  (LMP Unknown)   SpO2 99%   BMI 23.63 kg/m   Wt Readings from Last 3 Encounters:  07/13/18 142 lb (64.4 kg)  01/07/18 141 lb 6.4 oz (64.1 kg)  10/31/17 140 lb 6.4 oz (63.7 kg)      Physical Exam Vitals signs and nursing note reviewed.  Constitutional:      General: She is awake.     Appearance: She is well-developed.  HENT:     Head: Normocephalic.     Right Ear: Hearing normal.     Left Ear: Hearing normal.     Nose: Nose normal.     Mouth/Throat:     Mouth: Mucous membranes are moist.  Eyes:     General: Lids are normal.        Right eye: No discharge.        Left eye: No discharge.     Conjunctiva/sclera: Conjunctivae normal.     Pupils: Pupils are equal, round, and reactive to light.  Neck:     Musculoskeletal: Normal range of motion and neck supple.     Thyroid: No thyromegaly.     Vascular: No carotid bruit or JVD.  Cardiovascular:     Rate and Rhythm: Normal rate and regular rhythm.     Heart sounds: Normal heart sounds. No murmur. No gallop.   Pulmonary:     Effort: Pulmonary effort is normal.     Breath sounds: Normal breath sounds.  Abdominal:     General: Bowel sounds are normal.     Palpations: Abdomen is soft. There is no hepatomegaly or splenomegaly.  Musculoskeletal:     Right lower leg: No edema.     Left lower leg: No edema.  Lymphadenopathy:     Cervical: No cervical adenopathy.  Skin:    General: Skin is warm and dry.  Neurological:     Mental Status: She is alert and oriented to person, place, and time.  Psychiatric:        Attention and Perception: Attention normal.        Mood and Affect: Mood normal.        Behavior: Behavior normal. Behavior is cooperative.        Thought Content: Thought content normal.        Judgment: Judgment normal.     Results for orders placed or performed during the hospital encounter of 06/25/17  Surgical pathology  Result Value Ref Range   SURGICAL PATHOLOGY      Surgical Pathology CASE: [id number] PATIENT: Jake Seats Surgical Pathology Report     SPECIMEN SUBMITTED: A. Stomach ulcer 1; cbx B. Stomach ulcer 2; cbx C. Colon polyp, ascending; hot snare  CLINICAL  HISTORY: None provided  PRE-OPERATIVE DIAGNOSIS: GERD, K21.9, screening colonoscopy Z12.11  POST-OPERATIVE DIAGNOSIS: Gastric ulcers x2, colon polyp     DIAGNOSIS: A. STOMACH ULCER #1; COLD BIOPSY: - ANTRAL MUCOSA WITH CHANGES CONSISTENT WITH HEALING MUCOSAL INJURY. - NEGATIVE FOR H. PYLORI, DYSPLASIA, AND MALIGNANCY.  B. STOMACH ULCER #2; COLD BIOPSY: - ANTRAL MUCOSA WITH CHANGES CONSISTENT WITH HEALING MUCOSAL INJURY. - NEGATIVE FOR H. PYLORI, DYSPLASIA, AND MALIGNANCY.  C. COLON POLYP, ASCENDING; HOT SNARE: - SESSILE SERRATED ADENOMA. - NEGATIVE FOR HIGH-GRADE DYSPLASIA AND MALIGNANCY.  Comment: Sessile serrated adenomas (SSAs) are often difficult to detect  endoscopically as they are typically broad flat lesions found in the proximal colon.  By morphologic definition, SSAs demonstrate architectural (low grade) dysplasia, with or without concurrent cytologic dysplasia. SSAs are thought to be precursor lesions to a subset of colonic adenocarcinomas that arise through the serrated neoplastic pathway rather than the classical neoplasia pathway. Lesions of the serrated pathway have a high frequency of BRAF mutation and are more likely to be microsatellite unstable compared to tubular / villous adenomas of the classical pathway.  The recommended surveillance interval for a SSA <10 mm with no cytologic dysplasia is 5 years.  A SSA ?10 mm and a sessile serrated polyp with cytological dysplasia should be managed like a high risk adenoma (recommended surveillance interval of 3 years).  Serrated polyposis syndrome, as defined by WHO, should be considered with one of the following criteria: (1) at least 5 serrated polyps proximal to sigmoid, with 2 or more ?10 mm; (2) any serrated polyps proximal to sigmoid with f amily history of serrated polyposis syndrome; and (3) >20 serrated polyps of any size throughout the colon.  References: Guidelines for Colonoscopy Surveillance  After Screening and Polypectomy: A Consensus Update by the [place] Multi-Society Task Force on Colorectal Cancer. [place] [employer], 2012.    GROSS DESCRIPTION:  A. Labeled: C BX gastric ulcer #1  Tissue fragment(s): 1  Size: 0.5 cm  Description: in formalin, pink-tan fragment  Entirely submitted in one cassette(s).   B. Labeled: C BX gastric ulcer #2  Tissue fragment(s): 3  Size: 0.3-0.4 cm  Description: in formalin, pink-tan fragments  Entirely submitted in 1 cassette(s).  C. Labeled: hot snare polyp ascending colon  Tissue fragment(s): 4  Size: aggregate, 1.2 x 0.6 x 0.4 cm  Description: in formalin, yellow focally blue tinged fragments of tissue, 2 largest fragments differentially inked and bisected  Entirely submitted in 1 cassette(s).  Final Diagnosis performed by  Quay Burow, MD.  Electronically signed 06/26/2017 11:07:16AM    The electronic signature indicates that the named Attending Pathologist has evaluated the specimen  Technical component performed at [facility], [address], [place], [place] [address] Lab: [phone] Dir: Rush Farmer, MD, MMM  Professional component performed at [facility], [facility], [place], [place], [place] [address] Lab: [phone] Dir: Dellia Nims. Reuel Derby, MD        Assessment & Plan:   Problem List Items Addressed This Visit      Digestive   GERD (gastroesophageal reflux disease)    Chronic, ongoing.  Continue current medication regimen.  Magnesium level today.      Relevant Medications   omeprazole (PRILOSEC) 20 MG capsule   Other Relevant Orders   Magnesium   CBC with Differential/Platelet     Endocrine   Hypothyroidism    Chronic, ongoing.  Continue current Levothyroxine dose and adjust as needed based on thyroid panel results.  TSH today.      Relevant Medications   levothyroxine (SYNTHROID, LEVOTHROID) 100 MCG tablet   Other Relevant Orders   Thyroid  Panel With TSH   CBC with Differential/Platelet     Other   Anxiety and depression    Chronic, stable.  Continue current medication regimen.        Relevant Medications   citalopram (CELEXA) 40 MG tablet    Other Visit Diagnoses    Visit for screening mammogram    -  Primary   Relevant Orders   MM DIGITAL SCREENING BILATERAL       Follow up  plan: Return in about 1 year (around 07/14/2019) for Hypothyroid, Anxiety, GERD (physical).

## 2018-07-13 NOTE — Patient Instructions (Signed)
Hypothyroidism  Hypothyroidism is when the thyroid gland does not make enough of certain hormones (it is underactive). The thyroid gland is a small gland located in the lower front part of the neck, just in front of the windpipe (trachea). This gland makes hormones that help control how the body uses food for energy (metabolism) as well as how the heart and brain function. These hormones also play a role in keeping your bones strong. When the thyroid is underactive, it produces too little of the hormones thyroxine (T4) and triiodothyronine (T3). What are the causes? This condition may be caused by:  Hashimoto's disease. This is a disease in which the body's disease-fighting system (immune system) attacks the thyroid gland. This is the most common cause.  Viral infections.  Pregnancy.  Certain medicines.  Birth defects.  Past radiation treatments to the head or neck for cancer.  Past treatment with radioactive iodine.  Past exposure to radiation in the environment.  Past surgical removal of part or all of the thyroid.  Problems with a gland in the center of the brain (pituitary gland).  Lack of enough iodine in the diet. What increases the risk? You are more likely to develop this condition if:  You are female.  You have a family history of thyroid conditions.  You use a medicine called lithium.  You take medicines that affect the immune system (immunosuppressants). What are the signs or symptoms? Symptoms of this condition include:  Feeling as though you have no energy (lethargy).  Not being able to tolerate cold.  Weight gain that is not explained by a change in diet or exercise habits.  Lack of appetite.  Dry skin.  Coarse hair.  Menstrual irregularity.  Slowing of thought processes.  Constipation.  Sadness or depression. How is this diagnosed? This condition may be diagnosed based on:  Your symptoms, your medical history, and a physical exam.  Blood  tests. You may also have imaging tests, such as an ultrasound or MRI. How is this treated? This condition is treated with medicine that replaces the thyroid hormones that your body does not make. After you begin treatment, it may take several weeks for symptoms to go away. Follow these instructions at home:  Take over-the-counter and prescription medicines only as told by your health care provider.  If you start taking any new medicines, tell your health care provider.  Keep all follow-up visits as told by your health care provider. This is important. ? As your condition improves, your dosage of thyroid hormone medicine may change. ? You will need to have blood tests regularly so that your health care provider can monitor your condition. Contact a health care provider if:  Your symptoms do not get better with treatment.  You are taking thyroid replacement medicine and you: ? Sweat a lot. ? Have tremors. ? Feel anxious. ? Lose weight rapidly. ? Cannot tolerate heat. ? Have emotional swings. ? Have diarrhea. ? Feel weak. Get help right away if you have:  Chest pain.  An irregular heartbeat.  A rapid heartbeat.  Difficulty breathing. Summary  Hypothyroidism is when the thyroid gland does not make enough of certain hormones (it is underactive).  When the thyroid is underactive, it produces too little of the hormones thyroxine (T4) and triiodothyronine (T3).  The most common cause is Hashimoto's disease, a disease in which the body's disease-fighting system (immune system) attacks the thyroid gland. The condition can also be caused by viral infections, medicine, pregnancy, or past   radiation treatment to the head or neck.  Symptoms may include weight gain, dry skin, constipation, feeling as though you do not have energy, and not being able to tolerate cold.  This condition is treated with medicine to replace the thyroid hormones that your body does not make. This information  is not intended to replace advice given to you by your health care provider. Make sure you discuss any questions you have with your health care provider. Document Released: 05/13/2005 Document Revised: 04/23/2017 Document Reviewed: 04/23/2017 Elsevier Interactive Patient Education  2019 Elsevier Inc.  

## 2018-07-13 NOTE — Assessment & Plan Note (Signed)
Chronic, ongoing.  Continue current medication regimen.  Magnesium level today.

## 2018-07-13 NOTE — Assessment & Plan Note (Signed)
Chronic, ongoing.  Continue current Levothyroxine dose and adjust as needed based on thyroid panel results.  TSH today.

## 2018-07-13 NOTE — Assessment & Plan Note (Signed)
Chronic, stable.  Continue current medication regimen. 

## 2018-07-14 ENCOUNTER — Telehealth: Payer: Self-pay | Admitting: Nurse Practitioner

## 2018-07-14 DIAGNOSIS — E039 Hypothyroidism, unspecified: Secondary | ICD-10-CM

## 2018-07-14 LAB — CBC WITH DIFFERENTIAL/PLATELET
Basophils Absolute: 0.1 10*3/uL (ref 0.0–0.2)
Basos: 1 %
EOS (ABSOLUTE): 0.2 10*3/uL (ref 0.0–0.4)
Eos: 2 %
Hematocrit: 40.3 % (ref 34.0–46.6)
Hemoglobin: 13 g/dL (ref 11.1–15.9)
IMMATURE GRANULOCYTES: 0 %
Immature Grans (Abs): 0 10*3/uL (ref 0.0–0.1)
Lymphocytes Absolute: 4.4 10*3/uL — ABNORMAL HIGH (ref 0.7–3.1)
Lymphs: 45 %
MCH: 26.7 pg (ref 26.6–33.0)
MCHC: 32.3 g/dL (ref 31.5–35.7)
MCV: 83 fL (ref 79–97)
Monocytes Absolute: 0.7 10*3/uL (ref 0.1–0.9)
Monocytes: 7 %
Neutrophils Absolute: 4.4 10*3/uL (ref 1.4–7.0)
Neutrophils: 45 %
PLATELETS: 331 10*3/uL (ref 150–450)
RBC: 4.86 x10E6/uL (ref 3.77–5.28)
RDW: 15.1 % (ref 11.7–15.4)
WBC: 9.7 10*3/uL (ref 3.4–10.8)

## 2018-07-14 LAB — THYROID PANEL WITH TSH
Free Thyroxine Index: 2.1 (ref 1.2–4.9)
T3 Uptake Ratio: 27 % (ref 24–39)
T4, Total: 7.6 ug/dL (ref 4.5–12.0)
TSH: 5.47 u[IU]/mL — ABNORMAL HIGH (ref 0.450–4.500)

## 2018-07-14 LAB — MAGNESIUM: Magnesium: 2.1 mg/dL (ref 1.6–2.3)

## 2018-07-14 NOTE — Telephone Encounter (Signed)
Orders only

## 2018-10-21 ENCOUNTER — Telehealth: Payer: Self-pay | Admitting: Nurse Practitioner

## 2018-10-21 NOTE — Telephone Encounter (Signed)
Pt called in to see about getting medication for a possible uti. Offered to schedule appt, pt is wanting a call back from [place]. Routing to provider. Please advise.

## 2018-10-21 NOTE — Telephone Encounter (Signed)
Can you please put her on my 8:45 tomorrow and schedule her for 8 am urine sample.  Thanks

## 2018-10-22 ENCOUNTER — Other Ambulatory Visit: Payer: Self-pay | Admitting: Nurse Practitioner

## 2018-10-22 ENCOUNTER — Other Ambulatory Visit: Payer: Self-pay

## 2018-10-22 ENCOUNTER — Encounter: Payer: Self-pay | Admitting: Nurse Practitioner

## 2018-10-22 ENCOUNTER — Ambulatory Visit (INDEPENDENT_AMBULATORY_CARE_PROVIDER_SITE_OTHER): Payer: BC Managed Care – PPO | Admitting: Nurse Practitioner

## 2018-10-22 DIAGNOSIS — M545 Low back pain, unspecified: Secondary | ICD-10-CM | POA: Insufficient documentation

## 2018-10-22 DIAGNOSIS — E039 Hypothyroidism, unspecified: Secondary | ICD-10-CM | POA: Diagnosis not present

## 2018-10-22 DIAGNOSIS — R300 Dysuria: Secondary | ICD-10-CM

## 2018-10-22 LAB — UA/M W/RFLX CULTURE, ROUTINE
Bilirubin, UA: NEGATIVE
Glucose, UA: NEGATIVE
Ketones, UA: NEGATIVE
Leukocytes,UA: NEGATIVE
Nitrite, UA: NEGATIVE
RBC, UA: NEGATIVE
Specific Gravity, UA: 1.015 (ref 1.005–1.030)
Urobilinogen, Ur: 0.2 mg/dL (ref 0.2–1.0)
pH, UA: 6 (ref 5.0–7.5)

## 2018-10-22 LAB — MICROSCOPIC EXAMINATION
RBC, Urine: NONE SEEN /hpf (ref 0–2)
WBC, UA: NONE SEEN /hpf (ref 0–5)

## 2018-10-22 MED ORDER — CYCLOBENZAPRINE HCL 10 MG PO TABS: 10.0000 mg | ORAL_TABLET | Freq: Three times a day (TID) | ORAL | 0 refills | Status: DC | PRN

## 2018-10-22 MED ORDER — LEVOTHYROXINE SODIUM 100 MCG PO TABS: 100.0000 ug | ORAL_TABLET | Freq: Every day | ORAL | 3 refills | Status: DC

## 2018-10-22 NOTE — Patient Instructions (Signed)
Acute Back Pain, Adult  Acute back pain is sudden and usually short-lived. It is often caused by an injury to the muscles and tissues in the back. The injury may result from:   A muscle or ligament getting overstretched or torn (strained). Ligaments are tissues that connect bones to each other. Lifting something improperly can cause a back strain.   Wear and tear (degeneration) of the spinal disks. Spinal disks are circular tissue that provides cushioning between the bones of the spine (vertebrae).   Twisting motions, such as while playing sports or doing yard work.   A hit to the back.   Arthritis.  You may have a physical exam, lab tests, and imaging tests to find the cause of your pain. Acute back pain usually goes away with rest and home care.  Follow these instructions at home:  Managing pain, stiffness, and swelling   Take over-the-counter and prescription medicines only as told by your health care provider.   Your health care provider may recommend applying ice during the first 24-48 hours after your pain starts. To do this:  ? Put ice in a plastic bag.  ? Place a towel between your skin and the bag.  ? Leave the ice on for 20 minutes, 2-3 times a day.   If directed, apply heat to the affected area as often as told by your health care provider. Use the heat source that your health care provider recommends, such as a moist heat pack or a heating pad.  ? Place a towel between your skin and the heat source.  ? Leave the heat on for 20-30 minutes.  ? Remove the heat if your skin turns bright red. This is especially important if you are unable to feel pain, heat, or cold. You have a greater risk of getting burned.  Activity     Do not stay in bed. Staying in bed for more than 1-2 days can delay your recovery.   Sit up and stand up straight. Avoid leaning forward when you sit, or hunching over when you stand.  ? If you work at a desk, sit close to it so you do not need to lean over. Keep your chin tucked  in. Keep your neck drawn back, and keep your elbows bent at a right angle. Your arms should look like the letter "L."  ? Sit high and close to the steering wheel when you drive. Add lower back (lumbar) support to your car seat, if needed.   Take short walks on even surfaces as soon as you are able. Try to increase the length of time you walk each day.   Do not sit, drive, or stand in one place for more than 30 minutes at a time. Sitting or standing for long periods of time can put stress on your back.   Do not drive or use heavy machinery while taking prescription pain medicine.   Use proper lifting techniques. When you bend and lift, use positions that put less stress on your back:  ? Bend your knees.  ? Keep the load close to your body.  ? Avoid twisting.   Exercise regularly as told by your health care provider. Exercising helps your back heal faster and helps prevent back injuries by keeping muscles strong and flexible.   Work with a physical therapist to make a safe exercise program, as recommended by your health care provider. Do any exercises as told by your physical therapist.  Lifestyle   Maintain   a healthy weight. Extra weight puts stress on your back and makes it difficult to have good posture.   Avoid activities or situations that make you feel anxious or stressed. Stress and anxiety increase muscle tension and can make back pain worse. Learn ways to manage anxiety and stress, such as through exercise.  General instructions   Sleep on a firm mattress in a comfortable position. Try lying on your side with your knees slightly bent. If you lie on your back, put a pillow under your knees.   Follow your treatment plan as told by your health care provider. This may include:  ? Cognitive or behavioral therapy.  ? Acupuncture or massage therapy.  ? Meditation or yoga.  Contact a health care provider if:   You have pain that is not relieved with rest or medicine.   You have increasing pain going down  into your legs or buttocks.   Your pain does not improve after 2 weeks.   You have pain at night.   You lose weight without trying.   You have a fever or chills.  Get help right away if:   You develop new bowel or bladder control problems.   You have unusual weakness or numbness in your arms or legs.   You develop nausea or vomiting.   You develop abdominal pain.   You feel faint.  Summary   Acute back pain is sudden and usually short-lived.   Use proper lifting techniques. When you bend and lift, use positions that put less stress on your back.   Take over-the-counter and prescription medicines and apply heat or ice as directed by your health care provider.  This information is not intended to replace advice given to you by your health care provider. Make sure you discuss any questions you have with your health care provider.  Document Released: 05/13/2005 Document Revised: 12/18/2017 Document Reviewed: 12/25/2016  Elsevier Interactive Patient Education  2019 [employer]vier Inc.

## 2018-10-22 NOTE — Progress Notes (Signed)
Urine sample order

## 2018-10-22 NOTE — Progress Notes (Signed)
LMP  (LMP Unknown)    Subjective:    Patient ID: Laura Soto, female    DOB: [birth year], 58 y.o.   MRN: [id number]  HPI: Laura Soto is a 58 y.o. female  Chief Complaint  Patient presents with  . Back Pain    low back and leg pain    . This visit was completed via D[employer]ity due to the restrictions of the COVID-19 pandemic. All issues as above were discussed and addressed. Physical exam was done as above through visual confirmation on Doximity call. If it was felt that the patient should be evaluated in the office, they were directed there. The patient verbally consented to this visit. . Location of the patient: home . Location of the provider: home . Those involved with this call:  . Provider: Marnee Guarneri, DNP . CMA: Tiffany Reel, CMA . Front Desk/Registration: Jill Side  . Time spent on call: 15 minutes with patient face to face via video conference. More than 50% of this time was spent in counseling and coordination of care. 10 minutes total spent in review of patient's record and preparation of their chart. I verified patient identity using two factors (patient name and date of birth). Patient consents verbally to being seen via telemedicine visit today.   BACK PAIN Reports she started with lower back pain yesterday on way home from beach, driving.  This pain is low back midline, only noted when standing.  States she "finally had pooped recently".  Reports it was loose stool, which is normal for her per her report.  Denies straining to pass BM, had not had BM in two days.  Feels bloated under her belly.  She reports regular BM pattern at baseline.  Inquires whether this is UTI, no recent UTI per patient.  Denies any recent trauma or injury.  Denies rashes. Duration: days Mechanism of injury: unknown Location: low back Onset: gradual Severity: 8/10 Quality: dull and aching Frequency: intermittent Radiation: states it radiates into private area sometimes Aggravating  factors: movement and walking Alleviating factors: nothing Status: fluctuating Treatments attempted: none  Relief with NSAIDs?: No NSAIDs Taken Nighttime pain:  no Paresthesias / decreased sensation:  no Bowel / bladder incontinence:  no Fevers:  no Dysuria / urinary frequency:  no  Urinary frequency: no Urgency: no Small volume voids: no Urinary incontinence: no Foul odor: no Hematuria: no Abdominal pain: bloating occasionally Back pain: yes Suprapubic pain/pressure: no Flank pain: no Fever:  no Vomiting: no Recurrent urinary tract infection: no Sexual activity: yes History of sexually transmitted disease: no   Relevant past medical, surgical, family and social history reviewed and updated as indicated. Interim medical history since our last visit reviewed. Allergies and medications reviewed and updated.  Review of Systems  Constitutional: Negative for activity change, appetite change, diaphoresis, fatigue and fever.  Respiratory: Negative for cough, chest tightness and shortness of breath.   Cardiovascular: Negative for chest pain, palpitations and leg swelling.  Gastrointestinal: Positive for abdominal distention (occasional). Negative for abdominal pain, constipation, diarrhea, nausea and vomiting.  Genitourinary: Negative for decreased urine volume, dysuria, frequency and urgency.  Musculoskeletal: Positive for back pain.  Neurological: Negative for dizziness, syncope, weakness, light-headedness, numbness and headaches.  Psychiatric/Behavioral: Negative.     Per HPI unless specifically indicated above     Objective:    LMP  (LMP Unknown)   Wt Readings from Last 3 Encounters:  07/13/18 142 lb (64.4 kg)  01/07/18 141 lb 6.4 oz (64.1 kg)  10/31/17 140 lb 6.4 oz (63.7 kg)    Physical Exam Vitals signs and nursing note reviewed.  Constitutional:      General: She is awake. She is not in acute distress.    Appearance: She is well-developed. She is not  ill-appearing.     Comments: Very talkative during entire visit.  HENT:     Head: Normocephalic.     Right Ear: Hearing normal.     Left Ear: Hearing normal.  Eyes:     General: Lids are normal.        Right eye: No discharge.        Left eye: No discharge.     Conjunctiva/sclera: Conjunctivae normal.  Neck:     Musculoskeletal: Normal range of motion.  Cardiovascular:     Comments: Unable to auscultate due to virtual exam only Pulmonary:     Effort: Pulmonary effort is normal. No accessory muscle usage or respiratory distress.     Comments: Unable to auscultate due to virtual exam only Abdominal:     General: Abdomen is flat.     Palpations: Abdomen is soft.     Tenderness: There is abdominal tenderness.     Comments: Self palpation of abdomen by patient.  She reports abdomen is soft and "just a little sore at groin area", denies tenderness to other areas or when palpates back.    Musculoskeletal:     Lumbar back: She exhibits tenderness and pain. She exhibits normal range of motion, no swelling and no edema.     Comments: Viewed via virtual: Able to perform all ROM movements.  Stated only discomfort was when standing and "bending forward", but no decreased ROM noted.  She reports mild tenderness on palpation of mid lower back "over muscles".    Neurological:     Mental Status: She is alert and oriented to person, place, and time.  Psychiatric:        Attention and Perception: Attention normal.        Mood and Affect: Mood normal.        Behavior: Behavior normal. Behavior is cooperative.        Thought Content: Thought content normal.        Judgment: Judgment normal.     Results for orders placed or performed in visit on 10/22/18  Microscopic Examination  Result Value Ref Range   WBC, UA None seen 0 - 5 /hpf   RBC None seen 0 - 2 /hpf   Epithelial Cells (non renal) 0-10 0 - 10 /hpf   Casts Present None seen /lpf   Cast Type Granular casts (A) N/A   Bacteria, UA Few (A)  None seen/Few  UA/M w/rflx Culture, Routine  Result Value Ref Range   Specific Gravity, UA 1.015 1.005 - 1.030   pH, UA 6.0 5.0 - 7.5   Color, UA Yellow Yellow   Appearance Ur Clear Clear   Leukocytes,UA Negative Negative   Protein,UA 1+ (A) Negative/Trace   Glucose, UA Negative Negative   Ketones, UA Negative Negative   RBC, UA Negative Negative   Bilirubin, UA Negative Negative   Urobilinogen, Ur 0.2 0.2 - 1.0 mg/dL   Nitrite, UA Negative Negative   Microscopic Examination See below:       Assessment & Plan:   Problem List Items Addressed This Visit      Endocrine   Hypothyroidism   Relevant Medications   levothyroxine (SYNTHROID) 100 MCG tablet     Other  Acute midline low back pain without sciatica - Primary    Acute, lower mid back.  UA 1+ protein, neg nitrites, neg leuks, few bacteria.  Suspect musculoskeletal in nature based on lab and assessment.  Script for Flexeril sent.  Recommend use of Tylenol as needed + alternating heat and ice.  Monitor temperature daily.  If worsening symptoms, fever, or continued issues return to office immediately.        Relevant Medications   cyclobenzaprine (FLEXERIL) 10 MG tablet     I discussed the assessment and treatment plan with the patient. The patient was provided an opportunity to ask questions and all were answered. The patient agreed with the plan and demonstrated an understanding of the instructions.   The patient was advised to call back or seek an in-person evaluation if the symptoms worsen or if the condition fails to improve as anticipated.   I provided 15 minutes of time during this encounter.   Follow up plan: Return if symptoms worsen or fail to improve.

## 2018-10-22 NOTE — Assessment & Plan Note (Signed)
Acute, lower mid back.  UA 1+ protein, neg nitrites, neg leuks, few bacteria.  Suspect musculoskeletal in nature based on lab and assessment.  Script for Flexeril sent.  Recommend use of Tylenol as needed + alternating heat and ice.  Monitor temperature daily.  If worsening symptoms, fever, or continued issues return to office immediately.

## 2018-10-23 LAB — THYROID PANEL WITH TSH
Free Thyroxine Index: 2.3 (ref 1.2–4.9)
T3 Uptake Ratio: 29 % (ref 24–39)
T4, Total: 8 ug/dL (ref 4.5–12.0)
TSH: 7.77 u[IU]/mL — ABNORMAL HIGH (ref 0.450–4.500)

## 2018-10-23 MED ORDER — LEVOTHYROXINE SODIUM 125 MCG PO TABS: 125.0000 ug | ORAL_TABLET | Freq: Every day | ORAL | 3 refills | Status: DC

## 2018-10-23 MED ORDER — FLUCONAZOLE 150 MG PO TABS: 150.0000 mg | ORAL_TABLET | Freq: Once | ORAL | 0 refills | Status: AC

## 2018-10-23 NOTE — Addendum Note (Signed)
Addended by: Marnee Guarneri T on: 10/23/2018 05:10 PM   Modules accepted: Orders

## 2018-10-23 NOTE — Progress Notes (Signed)
Spoke to patient via telephone and updated her on TSH elevated from previous at 7.770.  Will increase her Levothyroxine dose and see her back in 6 weeks . She agrees with this plan of care and requested provider send in Diflucan pill because she is concerned for yeast infection and does not want to go over weekend worrying about it.  Scripts sent.  Will increase to 125 MCG.

## 2018-12-08 ENCOUNTER — Ambulatory Visit: Payer: Self-pay | Admitting: Nurse Practitioner

## 2019-01-18 ENCOUNTER — Other Ambulatory Visit: Payer: Self-pay | Admitting: Unknown Physician Specialty

## 2019-01-18 DIAGNOSIS — K219 Gastro-esophageal reflux disease without esophagitis: Secondary | ICD-10-CM

## 2019-01-18 NOTE — Telephone Encounter (Signed)
°  Relation to pt: self  Call back number: [phone]  Pharmacy: [facility], [place] [phone] (Phone) [phone] (Fax)     Reason for call:  Patient requesting omeprazole (PRILOSEC) 20 MG capsule, informed please allow 48 to 72 hour turn around time.

## 2019-01-19 MED ORDER — OMEPRAZOLE 20 MG PO CPDR: 20.0000 mg | DELAYED_RELEASE_CAPSULE | Freq: Two times a day (BID) | ORAL | 1 refills | Status: DC

## 2019-02-25 ENCOUNTER — Ambulatory Visit: Payer: BC Managed Care – PPO | Admitting: Unknown Physician Specialty

## 2019-02-25 ENCOUNTER — Encounter: Payer: Self-pay | Admitting: Unknown Physician Specialty

## 2019-02-25 ENCOUNTER — Other Ambulatory Visit: Payer: Self-pay

## 2019-02-25 DIAGNOSIS — E039 Hypothyroidism, unspecified: Secondary | ICD-10-CM | POA: Diagnosis not present

## 2019-02-25 DIAGNOSIS — F329 Major depressive disorder, single episode, unspecified: Secondary | ICD-10-CM

## 2019-02-25 DIAGNOSIS — R944 Abnormal results of kidney function studies: Secondary | ICD-10-CM | POA: Diagnosis not present

## 2019-02-25 DIAGNOSIS — F419 Anxiety disorder, unspecified: Secondary | ICD-10-CM

## 2019-02-25 NOTE — Assessment & Plan Note (Signed)
Elevated TSH.  Will check TSH and free T4.

## 2019-02-25 NOTE — Assessment & Plan Note (Signed)
Will draw a GFR as none done for the last 2 years.

## 2019-02-25 NOTE — Progress Notes (Signed)
BP 133/82 (BP Location: Left Arm, Patient Position: Sitting, Cuff Size: Normal)   Pulse 84   Temp 98.8 F (37.1 C) (Oral)   Ht 5\' 5"  (1.651 m)   Wt 142 lb (64.4 kg)   LMP  (LMP Unknown)   SpO2 97%   BMI 23.63 kg/m    Subjective:    Patient ID: Laura Soto, female    DOB: 1960-06-12, 58 y.o.   MRN: [id number]  HPI: Laura Soto is a 58 y.o. female  Chief Complaint  Patient presents with  . 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 She is having a lot of difficulty managing her multiple responsibilities.  She would like "a break."  She is taking Citalopram.  Review of lab shows elevated TSH but missed last visit.    Relevant past medical, surgical, family and social history reviewed and updated as indicated. Interim medical history since our last visit reviewed. Allergies and medications reviewed and updated.  Review of Systems  Constitutional: Negative for chills, fatigue and fever.  HENT: Negative.   Respiratory: Negative.   Cardiovascular: Negative.   Gastrointestinal: Negative.   Neurological: Negative.   Psychiatric/Behavioral: Negative.     Per HPI unless specifically indicated above     Objective:    BP 133/82 (BP Location: Left Arm, Patient Position: Sitting, Cuff Size: Normal)   Pulse 84   Temp 98.8 F (37.1 C) (Oral)   Ht 5\' 5"  (1.651 m)   Wt 142 lb (64.4 kg)   LMP  (LMP Unknown)   SpO2 97%   BMI 23.63 kg/m   Wt Readings from Last 3 Encounters:  02/25/19 142 lb (64.4 kg)  07/13/18 142 lb (64.4 kg)  01/07/18 141 lb 6.4 oz (64.1 kg)    Physical Exam Constitutional:      General: She is not in acute distress.    Appearance: Normal appearance. She is well-developed.  HENT:     Head: Normocephalic and atraumatic.  Eyes:     General: Lids are normal. No scleral icterus.       Right eye: No discharge.        Left eye: No discharge.   Conjunctiva/sclera: Conjunctivae normal.  Cardiovascular:     Rate and Rhythm: Normal rate.  Pulmonary:     Effort: Pulmonary effort is normal.  Abdominal:     Palpations: There is no hepatomegaly or splenomegaly.  Musculoskeletal: Normal range of motion.  Skin:    Coloration: Skin is not pale.     Findings: No rash.  Neurological:     Mental Status: She is alert and oriented to person, place, and time.  Psychiatric:        Behavior: Behavior normal.        Thought Content: Thought content normal.        Judgment: Judgment normal.     Results for orders placed or performed in visit on 10/22/18  Microscopic Examination   URINE  Result Value Ref Range   WBC, UA None seen 0 - 5 /hpf   RBC None seen 0 - 2 /hpf   Epithelial Cells (non renal) 0-10 0 - 10 /hpf   Casts Present None seen /lpf   Cast Type Granular casts (A) N/A   Bacteria, UA Few (A) None seen/Few  UA/M w/rflx Culture, Routine   Specimen: Urine   URINE  Result Value Ref Range  Specific Gravity, UA 1.015 1.005 - 1.030   pH, UA 6.0 5.0 - 7.5   Color, UA Yellow Yellow   Appearance Ur Clear Clear   Leukocytes,UA Negative Negative   Protein,UA 1+ (A) Negative/Trace   Glucose, UA Negative Negative   Ketones, UA Negative Negative   RBC, UA Negative Negative   Bilirubin, UA Negative Negative   Urobilinogen, Ur 0.2 0.2 - 1.0 mg/dL   Nitrite, UA Negative Negative   Microscopic Examination See below:   Thyroid Panel With TSH  Result Value Ref Range   TSH 7.770 (H) 0.450 - 4.500 uIU/mL   T4, Total 8.0 4.5 - 12.0 ug/dL   T3 Uptake Ratio 29 24 - 39 %   Free Thyroxine Index 2.3 1.2 - 4.9      Assessment & Plan:   Problem List Items Addressed This Visit      Unprioritized   Anxiety and depression    Having a flare today due to stress at work and home.  Gave note to be out 10 days, October 9.  I recommended she gets counseling to help manage her multiple stressors.  She agreed to utilize the [facility] at her workplace.   Discussed that Benzodiazepines are not the best option as stresses are worse at work.        Decreased GFR    Will draw a GFR as none done for the last 2 years.        Relevant Orders   Comprehensive metabolic panel   Hypothyroidism    Elevated TSH.  Will check TSH and free T4.        Relevant Orders   TSH   T4, free       Follow up plan: 4-5 weeks.

## 2019-02-25 NOTE — Assessment & Plan Note (Addendum)
Having a flare today due to stress at work and home.  Gave note to be out 10 days, October 9.  I recommended she gets counseling to help manage her multiple stressors.  She agreed to utilize the [facility] at her workplace.  Discussed that Benzodiazepines are not the best option as stresses are worse at work.

## 2019-02-26 ENCOUNTER — Telehealth: Payer: Self-pay | Admitting: Nurse Practitioner

## 2019-02-26 DIAGNOSIS — N183 Chronic kidney disease, stage 3 unspecified: Secondary | ICD-10-CM | POA: Insufficient documentation

## 2019-02-26 DIAGNOSIS — N1832 Chronic kidney disease, stage 3b: Secondary | ICD-10-CM | POA: Insufficient documentation

## 2019-02-26 LAB — COMPREHENSIVE METABOLIC PANEL
ALT: 22 IU/L (ref 0–32)
AST: 27 IU/L (ref 0–40)
Albumin/Globulin Ratio: 1.6 (ref 1.2–2.2)
Albumin: 4.4 g/dL (ref 3.8–4.9)
Alkaline Phosphatase: 107 IU/L (ref 39–117)
BUN/Creatinine Ratio: 16 (ref 12–28)
BUN: 22 mg/dL (ref 8–27)
Bilirubin Total: 0.2 mg/dL (ref 0.0–1.2)
CO2: 22 mmol/L (ref 20–29)
Calcium: 9.7 mg/dL (ref 8.7–10.3)
Chloride: 104 mmol/L (ref 96–106)
Creatinine, Ser: 1.37 mg/dL — ABNORMAL HIGH (ref 0.57–1.00)
GFR calc Af Amer: 48 mL/min/{1.73_m2} — ABNORMAL LOW (ref >59)
GFR calc non Af Amer: 42 mL/min/{1.73_m2} — ABNORMAL LOW (ref >59)
Globulin, Total: 2.8 g/dL (ref 1.5–4.5)
Glucose: 81 mg/dL (ref 65–99)
Potassium: 5.1 mmol/L (ref 3.5–5.2)
Sodium: 140 mmol/L (ref 134–144)
Total Protein: 7.2 g/dL (ref 6.0–8.5)

## 2019-02-26 LAB — TSH: TSH: 0.026 u[IU]/mL — ABNORMAL LOW (ref 0.450–4.500)

## 2019-02-26 LAB — T4, FREE: Free T4: 2.15 ng/dL — ABNORMAL HIGH (ref 0.82–1.77)

## 2019-02-26 MED ORDER — BUSPIRONE HCL 5 MG PO TABS: 5.0000 mg | ORAL_TABLET | Freq: Two times a day (BID) | ORAL | 0 refills | Status: DC | PRN

## 2019-02-26 MED ORDER — LEVOTHYROXINE SODIUM 100 MCG PO TABS: 100.0000 ug | ORAL_TABLET | Freq: Every day | ORAL | 0 refills | Status: DC

## 2019-02-26 NOTE — Telephone Encounter (Signed)
Spoke to patient on telephone and review labs.  TSH down from 7.770 4 months ago, she missed visit between current and last, now TSH 0.026 and T4 2.15.  Will adjust Levothyroxine to 100 MCG, currently at 125.  Discussed this low TSH could be some cause of current anxiety, along with her life stressors.  Script sent into pharmacy.  Reviewed her current kidney function.  Continues to have some CKD 3, as noted on labs two years ago.  Educated her on avoiding medications containing Ibuprofen and increasing water intake.  Will further review with her at next visit and recheck.  She continues to discuss need for something for her anxiety, discussed that benzos are not considered first line.  Recommend she continue her Citalopram and we can add on Buspar as needed for short period until thyroid level improved and life stressors better.  Scripts sent.

## 2019-03-01 NOTE — Telephone Encounter (Signed)
Called and left patient a VM asking for her to please return my call to schedule her f/up appointment.

## 2019-03-02 NOTE — Telephone Encounter (Signed)
Appointment scheduled for 03/29/19.

## 2019-03-04 ENCOUNTER — Telehealth: Payer: Self-pay

## 2019-03-04 NOTE — Telephone Encounter (Signed)
Please see message below Copied from House [phone]. Topic: General - Other >> Mar 04, 2019 11:46 AM Reyne Dumas L wrote: Reason for CRM:   Pt calling:  states that she is feeling better but is still tired.  She would like for someone to write her out of work for one more week.  Pt would like to return to work on 03/15/2019. >> Mar 04, 2019 11:53 AM Don Perking M wrote: Pt saw Malachy Mood last week and was told that she could have 2 weeks out but the pt opted for 1 week. Pt is calling back to extend the note.  Please advise.

## 2019-03-05 NOTE — Telephone Encounter (Signed)
Work note extended per [employer]. Called and let patient know about letter and Cheryl's message. Patient verbalized understanding.

## 2019-03-05 NOTE — Telephone Encounter (Signed)
OK for one more week.  Please ask her to make sure to f/u with employee assistance counseling or discussion with pastor

## 2019-03-05 NOTE — Telephone Encounter (Signed)
Pt calling back to check status. Please advise  °

## 2019-03-05 NOTE — Telephone Encounter (Signed)
Can patient's note be extended for another week?

## 2019-03-29 ENCOUNTER — Other Ambulatory Visit: Payer: Self-pay

## 2019-03-29 ENCOUNTER — Ambulatory Visit (INDEPENDENT_AMBULATORY_CARE_PROVIDER_SITE_OTHER): Payer: BC Managed Care – PPO | Admitting: Nurse Practitioner

## 2019-03-29 ENCOUNTER — Encounter: Payer: Self-pay | Admitting: Nurse Practitioner

## 2019-03-29 VITALS — BP 128/82 | HR 81 | Temp 99.3°F | Ht 65.0 in | Wt 142.0 lb

## 2019-03-29 DIAGNOSIS — E039 Hypothyroidism, unspecified: Secondary | ICD-10-CM | POA: Diagnosis not present

## 2019-03-29 DIAGNOSIS — R944 Abnormal results of kidney function studies: Secondary | ICD-10-CM | POA: Diagnosis not present

## 2019-03-29 MED ORDER — BUSPIRONE HCL 5 MG PO TABS: 5.0000 mg | ORAL_TABLET | Freq: Two times a day (BID) | ORAL | 2 refills | Status: DC | PRN

## 2019-03-29 NOTE — Progress Notes (Signed)
BP 128/82 (BP Location: Left Arm, Patient Position: Sitting)   Pulse 81   Temp 99.3 F (37.4 C) (Oral)   Ht 5\' 5"  (1.651 m)   Wt 142 lb (64.4 kg)   LMP  (LMP Unknown)   SpO2 95%   BMI 23.63 kg/m    Subjective:    Patient ID: Laura Soto, female    DOB: 10-29-1960, 58 y.o.   MRN: [id number]  HPI: Laura Soto is a 58 y.o. female  Chief Complaint  Patient presents with  . Hypothyroidism   HYPOTHYROIDISM Continue on Levothyroxine 100 MCG.  Last TSH one month ago 0.026 and T4 2.15.  She has been taking Buspar as needed.  Reports this helps anxiety.  States overall with dose reduction of Levothyroxine she is feeling 75% better.  Recent labs also noted slight decline in kidney function, agrees with recheck of this today.   Thyroid control status:stable Satisfied with current treatment? yes Medication side effects: no Medication compliance: good compliance Etiology of hypothyroidism: unknown Recent dose adjustment:yes Fatigue: no Cold intolerance: no Heat intolerance: no Weight gain: no Weight loss: no Constipation: no Diarrhea/loose stools: no Palpitations: no Lower extremity edema: no Anxiety/depressed mood: yes  Relevant past medical, surgical, family and social history reviewed and updated as indicated. Interim medical history since our last visit reviewed. Allergies and medications reviewed and updated.  Review of Systems  Constitutional: Negative for activity change, appetite change, diaphoresis, fatigue and fever.  Respiratory: Negative for cough, chest tightness and shortness of breath.   Cardiovascular: Negative for chest pain, palpitations and leg swelling.  Gastrointestinal: Negative for abdominal distention, abdominal pain, constipation, diarrhea, nausea and vomiting.  Endocrine: Negative for cold intolerance, heat intolerance, polydipsia, polyphagia and polyuria.  Neurological: Negative for dizziness, syncope, weakness, light-headedness, numbness and  headaches.  Psychiatric/Behavioral: Negative.     Per HPI unless specifically indicated above     Objective:    BP 128/82 (BP Location: Left Arm, Patient Position: Sitting)   Pulse 81   Temp 99.3 F (37.4 C) (Oral)   Ht 5\' 5"  (1.651 m)   Wt 142 lb (64.4 kg)   LMP  (LMP Unknown)   SpO2 95%   BMI 23.63 kg/m   Wt Readings from Last 3 Encounters:  03/29/19 142 lb (64.4 kg)  02/25/19 142 lb (64.4 kg)  07/13/18 142 lb (64.4 kg)    Physical Exam Vitals signs and nursing note reviewed.  Constitutional:      General: She is awake. She is not in acute distress.    Appearance: She is well-developed. She is not ill-appearing.  HENT:     Head: Normocephalic.     Right Ear: Hearing normal.     Left Ear: Hearing normal.  Eyes:     General: Lids are normal.        Right eye: No discharge.        Left eye: No discharge.     Conjunctiva/sclera: Conjunctivae normal.     Pupils: Pupils are equal, round, and reactive to light.  Neck:     Musculoskeletal: Normal range of motion and neck supple.     Thyroid: No thyromegaly.     Vascular: No carotid bruit.  Cardiovascular:     Rate and Rhythm: Normal rate and regular rhythm.     Heart sounds: Normal heart sounds. No murmur. No gallop.   Pulmonary:     Effort: Pulmonary effort is normal. No accessory muscle usage or respiratory distress.  Breath sounds: Normal breath sounds.  Abdominal:     General: Bowel sounds are normal.     Palpations: Abdomen is soft.  Musculoskeletal:     Right lower leg: No edema.     Left lower leg: No edema.  Skin:    General: Skin is warm and dry.  Neurological:     Mental Status: She is alert and oriented to person, place, and time.  Psychiatric:        Attention and Perception: Attention normal.        Mood and Affect: Mood normal.        Behavior: Behavior normal. Behavior is cooperative.        Thought Content: Thought content normal.        Judgment: Judgment normal.     Results for orders  placed or performed in visit on 02/25/19  Comprehensive metabolic panel  Result Value Ref Range   Glucose 81 65 - 99 mg/dL   BUN 22 8 - 27 mg/dL   Creatinine, Ser 1.37 (H) 0.57 - 1.00 mg/dL   GFR calc non Af Amer 42 (L) >59 mL/min/1.73   GFR calc Af Amer 48 (L) >59 mL/min/1.73   BUN/Creatinine Ratio 16 12 - 28   Sodium 140 134 - 144 mmol/L   Potassium 5.1 3.5 - 5.2 mmol/L   Chloride 104 96 - 106 mmol/L   CO2 22 20 - 29 mmol/L   Calcium 9.7 8.7 - 10.3 mg/dL   Total Protein 7.2 6.0 - 8.5 g/dL   Albumin 4.4 3.8 - 4.9 g/dL   Globulin, Total 2.8 1.5 - 4.5 g/dL   Albumin/Globulin Ratio 1.6 1.2 - 2.2   Bilirubin Total 0.2 0.0 - 1.2 mg/dL   Alkaline Phosphatase 107 39 - 117 IU/L   AST 27 0 - 40 IU/L   ALT 22 0 - 32 IU/L  TSH  Result Value Ref Range   TSH 0.026 (L) 0.450 - 4.500 uIU/mL  T4, free  Result Value Ref Range   Free T4 2.15 (H) 0.82 - 1.77 ng/dL      Assessment & Plan:   Problem List Items Addressed This Visit      Endocrine   Hypothyroidism - Primary    Chronic, ongoing with recent dose change due to low TSH.  Continue current Levothyroxine dose and recheck TSH today.  Adjust dose as needed based on results.  Return in 4 weeks.      Relevant Orders   Thyroid Panel With TSH     Other   Decreased GFR    Recheck CMP today and adjust regimen as needed.  Recommend increase water intake during daytime hours.      Relevant Orders   Comprehensive metabolic panel       Follow up plan: Return in about 4 weeks (around 04/26/2019) for Mood and thyroid.

## 2019-03-29 NOTE — Patient Instructions (Signed)

## 2019-03-29 NOTE — Assessment & Plan Note (Signed)
Chronic, ongoing with recent dose change due to low TSH.  Continue current Levothyroxine dose and recheck TSH today.  Adjust dose as needed based on results.  Return in 4 weeks.

## 2019-03-29 NOTE — Assessment & Plan Note (Signed)
Recheck CMP today and adjust regimen as needed.  Recommend increase water intake during daytime hours.

## 2019-03-30 LAB — COMPREHENSIVE METABOLIC PANEL
ALT: 15 IU/L (ref 0–32)
AST: 21 IU/L (ref 0–40)
Albumin/Globulin Ratio: 1.5 (ref 1.2–2.2)
Albumin: 4.4 g/dL (ref 3.8–4.9)
Alkaline Phosphatase: 124 IU/L — ABNORMAL HIGH (ref 39–117)
BUN/Creatinine Ratio: 14 (ref 12–28)
BUN: 19 mg/dL (ref 8–27)
Bilirubin Total: 0.3 mg/dL (ref 0.0–1.2)
CO2: 21 mmol/L (ref 20–29)
Calcium: 9.9 mg/dL (ref 8.7–10.3)
Chloride: 104 mmol/L (ref 96–106)
Creatinine, Ser: 1.32 mg/dL — ABNORMAL HIGH (ref 0.57–1.00)
GFR calc Af Amer: 51 mL/min/{1.73_m2} — ABNORMAL LOW (ref >59)
GFR calc non Af Amer: 44 mL/min/{1.73_m2} — ABNORMAL LOW (ref >59)
Globulin, Total: 3 g/dL (ref 1.5–4.5)
Glucose: 78 mg/dL (ref 65–99)
Potassium: 4.5 mmol/L (ref 3.5–5.2)
Sodium: 139 mmol/L (ref 134–144)
Total Protein: 7.4 g/dL (ref 6.0–8.5)

## 2019-03-30 LAB — THYROID PANEL WITH TSH
Free Thyroxine Index: 2.2 (ref 1.2–4.9)
T3 Uptake Ratio: 28 % (ref 24–39)
T4, Total: 7.7 ug/dL (ref 4.5–12.0)
TSH: 0.955 u[IU]/mL (ref 0.450–4.500)

## 2019-04-26 ENCOUNTER — Ambulatory Visit: Payer: BC Managed Care – PPO | Admitting: Nurse Practitioner

## 2019-04-28 ENCOUNTER — Ambulatory Visit: Payer: BC Managed Care – PPO | Admitting: Nurse Practitioner

## 2019-04-28 ENCOUNTER — Encounter: Payer: Self-pay | Admitting: Nurse Practitioner

## 2019-04-28 ENCOUNTER — Other Ambulatory Visit: Payer: Self-pay

## 2019-04-28 VITALS — BP 145/69 | HR 80 | Temp 98.7°F | Ht 63.25 in | Wt 142.0 lb

## 2019-04-28 DIAGNOSIS — E039 Hypothyroidism, unspecified: Secondary | ICD-10-CM

## 2019-04-28 NOTE — Progress Notes (Signed)
BP (!) 145/69   Pulse 80   Temp 98.7 F (37.1 C)   Ht 5' 3.25" (1.607 m)   Wt 142 lb (64.4 kg)   LMP  (LMP Unknown)   SpO2 98%   BMI 24.96 kg/m    Subjective:    Patient ID: Laura Soto, female    DOB: 01-06-1961, 58 y.o.   MRN: [id number]  HPI: Laura Soto is a 58 y.o. female  Chief Complaint  Patient presents with  . Hypothyroidism   HYPOTHYROIDISM States overall feeling much better since recent medication change.  Continues on Levothyroxine 100 MCG with recent TSH 0.955, improved from previous 0.026.  States overall improvement in mood. Thyroid control status:stable Satisfied with current treatment? yes Medication side effects: no Medication compliance: good compliance Etiology of hypothyroidism:  Recent dose adjustment:yes Fatigue: no Cold intolerance: no Heat intolerance: no Weight gain: no Weight loss: no Constipation: no Diarrhea/loose stools: no Palpitations: no Lower extremity edema: no Anxiety/depressed mood: no  Relevant past medical, surgical, family and social history reviewed and updated as indicated. Interim medical history since our last visit reviewed. Allergies and medications reviewed and updated.  Review of Systems  Constitutional: Negative for activity change, appetite change, diaphoresis, fatigue and fever.  Respiratory: Negative for cough, chest tightness and shortness of breath.   Cardiovascular: Negative for chest pain, palpitations and leg swelling.  Gastrointestinal: Negative for abdominal distention, abdominal pain, constipation, diarrhea, nausea and vomiting.  Endocrine: Negative for cold intolerance and heat intolerance.  Neurological: Negative for dizziness, syncope, weakness, light-headedness, numbness and headaches.  Psychiatric/Behavioral: Negative.     Per HPI unless specifically indicated above     Objective:    BP (!) 145/69   Pulse 80   Temp 98.7 F (37.1 C)   Ht 5' 3.25" (1.607 m)   Wt 142 lb (64.4 kg)    LMP  (LMP Unknown)   SpO2 98%   BMI 24.96 kg/m   Wt Readings from Last 3 Encounters:  04/28/19 142 lb (64.4 kg)  03/29/19 142 lb (64.4 kg)  02/25/19 142 lb (64.4 kg)    Physical Exam Vitals signs and nursing note reviewed.  Constitutional:      General: She is awake. She is not in acute distress.    Appearance: She is well-developed. She is not ill-appearing.  HENT:     Head: Normocephalic.     Right Ear: Hearing normal.     Left Ear: Hearing normal.  Eyes:     General: Lids are normal.        Right eye: No discharge.        Left eye: No discharge.     Conjunctiva/sclera: Conjunctivae normal.     Pupils: Pupils are equal, round, and reactive to light.  Neck:     Musculoskeletal: Normal range of motion and neck supple.     Thyroid: No thyromegaly.     Vascular: No carotid bruit.  Cardiovascular:     Rate and Rhythm: Normal rate and regular rhythm.     Heart sounds: Normal heart sounds. No murmur. No gallop.   Pulmonary:     Effort: Pulmonary effort is normal. No accessory muscle usage or respiratory distress.     Breath sounds: Normal breath sounds.  Abdominal:     General: Bowel sounds are normal.     Palpations: Abdomen is soft.  Musculoskeletal:     Right lower leg: No edema.     Left lower leg: No edema.  Skin:    General: Skin is warm and dry.  Neurological:     Mental Status: She is alert and oriented to person, place, and time.  Psychiatric:        Attention and Perception: Attention normal.        Mood and Affect: Mood normal.        Behavior: Behavior normal. Behavior is cooperative.        Thought Content: Thought content normal.        Judgment: Judgment normal.     Results for orders placed or performed in visit on 03/29/19  Thyroid Panel With TSH  Result Value Ref Range   TSH 0.955 0.450 - 4.500 uIU/mL   T4, Total 7.7 4.5 - 12.0 ug/dL   T3 Uptake Ratio 28 24 - 39 %   Free Thyroxine Index 2.2 1.2 - 4.9  Comprehensive metabolic panel  Result  Value Ref Range   Glucose 78 65 - 99 mg/dL   BUN 19 8 - 27 mg/dL   Creatinine, Ser 1.32 (H) 0.57 - 1.00 mg/dL   GFR calc non Af Amer 44 (L) >59 mL/min/1.73   GFR calc Af Amer 51 (L) >59 mL/min/1.73   BUN/Creatinine Ratio 14 12 - 28   Sodium 139 134 - 144 mmol/L   Potassium 4.5 3.5 - 5.2 mmol/L   Chloride 104 96 - 106 mmol/L   CO2 21 20 - 29 mmol/L   Calcium 9.9 8.7 - 10.3 mg/dL   Total Protein 7.4 6.0 - 8.5 g/dL   Albumin 4.4 3.8 - 4.9 g/dL   Globulin, Total 3.0 1.5 - 4.5 g/dL   Albumin/Globulin Ratio 1.5 1.2 - 2.2   Bilirubin Total 0.3 0.0 - 1.2 mg/dL   Alkaline Phosphatase 124 (H) 39 - 117 IU/L   AST 21 0 - 40 IU/L   ALT 15 0 - 32 IU/L      Assessment & Plan:   Problem List Items Addressed This Visit      Endocrine   Hypothyroidism - Primary    Chronic, ongoing with recent dose change due to low TSH.  Continue current Levothyroxine dose and recheck TSH today.  Adjust dose as needed based on results.  Return in 6 months.      Relevant Orders   Thyroid Panel With TSH       Follow up plan: Return in about 6 months (around 10/27/2019) for Hypothyroid, CKD, GERD.

## 2019-04-28 NOTE — Patient Instructions (Signed)
Hypothyroidism  Hypothyroidism is when the thyroid gland does not make enough of certain hormones (it is underactive). The thyroid gland is a small gland located in the lower front part of the neck, just in front of the windpipe (trachea). This gland makes hormones that help control how the body uses food for energy (metabolism) as well as how the heart and brain function. These hormones also play a role in keeping your bones strong. When the thyroid is underactive, it produces too little of the hormones thyroxine (T4) and triiodothyronine (T3). What are the causes? This condition may be caused by:  Hashimoto's disease. This is a disease in which the body's disease-fighting system (immune system) attacks the thyroid gland. This is the most common cause.  Viral infections.  Pregnancy.  Certain medicines.  Birth defects.  Past radiation treatments to the head or neck for cancer.  Past treatment with radioactive iodine.  Past exposure to radiation in the environment.  Past surgical removal of part or all of the thyroid.  Problems with a gland in the center of the brain (pituitary gland).  Lack of enough iodine in the diet. What increases the risk? You are more likely to develop this condition if:  You are female.  You have a family history of thyroid conditions.  You use a medicine called lithium.  You take medicines that affect the immune system (immunosuppressants). What are the signs or symptoms? Symptoms of this condition include:  Feeling as though you have no energy (lethargy).  Not being able to tolerate cold.  Weight gain that is not explained by a change in diet or exercise habits.  Lack of appetite.  Dry skin.  Coarse hair.  Menstrual irregularity.  Slowing of thought processes.  Constipation.  Sadness or depression. How is this diagnosed? This condition may be diagnosed based on:  Your symptoms, your medical history, and a physical exam.  Blood  tests. You may also have imaging tests, such as an ultrasound or MRI. How is this treated? This condition is treated with medicine that replaces the thyroid hormones that your body does not make. After you begin treatment, it may take several weeks for symptoms to go away. Follow these instructions at home:  Take over-the-counter and prescription medicines only as told by your health care provider.  If you start taking any new medicines, tell your health care provider.  Keep all follow-up visits as told by your health care provider. This is important. ? As your condition improves, your dosage of thyroid hormone medicine may change. ? You will need to have blood tests regularly so that your health care provider can monitor your condition. Contact a health care provider if:  Your symptoms do not get better with treatment.  You are taking thyroid replacement medicine and you: ? Sweat a lot. ? Have tremors. ? Feel anxious. ? Lose weight rapidly. ? Cannot tolerate heat. ? Have emotional swings. ? Have diarrhea. ? Feel weak. Get help right away if you have:  Chest pain.  An irregular heartbeat.  A rapid heartbeat.  Difficulty breathing. Summary  Hypothyroidism is when the thyroid gland does not make enough of certain hormones (it is underactive).  When the thyroid is underactive, it produces too little of the hormones thyroxine (T4) and triiodothyronine (T3).  The most common cause is Hashimoto's disease, a disease in which the body's disease-fighting system (immune system) attacks the thyroid gland. The condition can also be caused by viral infections, medicine, pregnancy, or past   radiation treatment to the head or neck.  Symptoms may include weight gain, dry skin, constipation, feeling as though you do not have energy, and not being able to tolerate cold.  This condition is treated with medicine to replace the thyroid hormones that your body does not make. This information  is not intended to replace advice given to you by your health care provider. Make sure you discuss any questions you have with your health care provider. Document Released: 05/13/2005 Document Revised: 04/25/2017 Document Reviewed: 04/23/2017 Elsevier Patient Education  2020 [employer]  

## 2019-04-28 NOTE — Assessment & Plan Note (Addendum)
Chronic, ongoing with recent dose change due to low TSH.  Continue current Levothyroxine dose and recheck TSH today.  Adjust dose as needed based on results.  Return in 6 months.

## 2019-04-29 LAB — THYROID PANEL WITH TSH
Free Thyroxine Index: 2.4 (ref 1.2–4.9)
T3 Uptake Ratio: 30 % (ref 24–39)
T4, Total: 8 ug/dL (ref 4.5–12.0)
TSH: 1.44 u[IU]/mL (ref 0.450–4.500)

## 2019-07-19 ENCOUNTER — Encounter: Payer: BC Managed Care – PPO | Admitting: Nurse Practitioner

## 2019-07-19 ENCOUNTER — Other Ambulatory Visit: Payer: Self-pay | Admitting: Nurse Practitioner

## 2019-07-19 DIAGNOSIS — F419 Anxiety disorder, unspecified: Secondary | ICD-10-CM

## 2019-07-19 DIAGNOSIS — F329 Major depressive disorder, single episode, unspecified: Secondary | ICD-10-CM

## 2019-07-19 DIAGNOSIS — K219 Gastro-esophageal reflux disease without esophagitis: Secondary | ICD-10-CM

## 2019-07-26 ENCOUNTER — Telehealth: Payer: Self-pay | Admitting: Nurse Practitioner

## 2019-07-26 NOTE — Telephone Encounter (Signed)
Would benefit from visit please for this.  Thank you.

## 2019-07-26 NOTE — Telephone Encounter (Signed)
Scheduled for 3/3 3:45

## 2019-07-26 NOTE — Telephone Encounter (Signed)
Contacted pt to see what she wanted to speak with provider about, she mentioned that she has spoken to [facility] previously in regards to libido. Wants to try a pill. Please advise if patient needs to schedule appt.    Copied from [place] [id number]. Topic: General - Inquiry >> Jul 26, 2019  2:10 PM Virl Axe D wrote: Reason for CRM: Pt is requesting a callback from [facility] today. Stated she would like to discuss a personal matter that they have talked about before. Did not want to give more details. Please advise.

## 2019-07-28 ENCOUNTER — Encounter: Payer: Self-pay | Admitting: Nurse Practitioner

## 2019-07-28 ENCOUNTER — Other Ambulatory Visit: Payer: Self-pay

## 2019-07-28 ENCOUNTER — Ambulatory Visit: Payer: BC Managed Care – PPO | Admitting: Nurse Practitioner

## 2019-07-28 VITALS — BP 93/59 | HR 89 | Temp 99.0°F

## 2019-07-28 DIAGNOSIS — E039 Hypothyroidism, unspecified: Secondary | ICD-10-CM | POA: Diagnosis not present

## 2019-07-28 DIAGNOSIS — F419 Anxiety disorder, unspecified: Secondary | ICD-10-CM | POA: Diagnosis not present

## 2019-07-28 DIAGNOSIS — F329 Major depressive disorder, single episode, unspecified: Secondary | ICD-10-CM

## 2019-07-28 DIAGNOSIS — N951 Menopausal and female climacteric states: Secondary | ICD-10-CM | POA: Diagnosis not present

## 2019-07-28 DIAGNOSIS — N952 Postmenopausal atrophic vaginitis: Secondary | ICD-10-CM | POA: Insufficient documentation

## 2019-07-28 MED ORDER — LEVOTHYROXINE SODIUM 100 MCG PO TABS: 100.0000 ug | ORAL_TABLET | Freq: Every day | ORAL | 3 refills | Status: DC

## 2019-07-28 MED ORDER — ESTRADIOL 0.1 MG/GM VA CREA: 1.0000 | TOPICAL_CREAM | Freq: Every day | VAGINAL | 8 refills | Status: DC

## 2019-07-28 MED ORDER — CITALOPRAM HYDROBROMIDE 40 MG PO TABS: 40.0000 mg | ORAL_TABLET | Freq: Every day | ORAL | 3 refills | Status: DC

## 2019-07-28 NOTE — Patient Instructions (Signed)
Atrophic Vaginitis Atrophic vaginitis is a condition in which the tissues that line the vagina become dry and thin. This condition occurs in women who have stopped having their period. It is caused by a drop in a female hormone (estrogen). This hormone helps:  To keep the vagina moist.  To make a clear fluid. This clear fluid helps: ? To make the vagina ready for sex. ? To protect the vagina from infection. If the lining of the vagina is dry and thin, it may cause irritation, burning, or itchiness. It may also:  Make sex painful.  Make an exam of your vagina painful.  Cause bleeding.  Make you lose interest in sex.  Cause a burning feeling when you pee (urinate).  Cause a brown or yellow fluid to come from your vagina. Some women do not have symptoms. Follow these instructions at home: Medicines  Take over-the-counter and prescription medicines only as told by your doctor.  Do not use herbs or other medicines unless your doctor says it is okay.  Use medicines for for dryness. These include: ? Oils to make the vagina soft. ? Creams. ? Moisturizers. General instructions  Do not douche.  Do not use products that can make your vagina dry. These include: ? Scented sprays. ? Scented tampons. ? Scented soaps.  Sex can help increase blood flow and soften the tissue in the vagina. If it hurts to have sex: ? Tell your partner. ? Use products to make sex more comfortable. Use these only as told by your doctor. Contact a doctor if you:  Have discharge from the vagina that is different than usual.  Have a bad smell coming from your vagina.  Have new symptoms.  Do not get better.  Get worse. Summary  Atrophic vaginitis is a condition in which the lining of the vagina becomes dry and thin.  This condition affects women who have stopped having their periods.  Treatment may include using products that help make the vagina soft.  Call a doctor if do not get better with  treatment. This information is not intended to replace advice given to you by your health care provider. Make sure you discuss any questions you have with your health care provider. Document Revised: 05/26/2017 Document Reviewed: 05/26/2017 Elsevier Patient Education  2020 [employer] Inc[employer]  

## 2019-07-28 NOTE — Assessment & Plan Note (Signed)
Discussed various options, since main issue is vaginal dryness and discomfort with sexual intercourse, suspect some vaginal atrophy present.  Will start out with vaginal Estrace, script sent.  Return in 4 weeks for follow-up.  If ongoing could consider dual oral therapy or patch (progesterone/estrogen need due to intact uterus).

## 2019-07-28 NOTE — Progress Notes (Signed)
BP (!) 93/59 (BP Location: Left Arm, Patient Position: Sitting, Cuff Size: Normal)   Pulse 89   Temp 99 F (37.2 C) (Oral)   LMP  (LMP Unknown)   SpO2 97%    Subjective:    Patient ID: Laura Soto, female    DOB: Sep 28, 1960, 59 y.o.   MRN: [id number]  HPI: Laura Soto is a 59 y.o. female  Chief Complaint  Patient presents with  . libido   HYPOTHYROIDISM Continues on Levothyroxine 100 MCG, last TSH was 1.440 and T4 8.0. Thyroid control status:stable Satisfied with current treatment? yes Medication side effects: no Medication compliance: good compliance Etiology of hypothyroidism:  Recent dose adjustment:no Fatigue: no Cold intolerance: no Heat intolerance: no Weight gain: no Weight loss: no Constipation: no Diarrhea/loose stools: no Palpitations: no Lower extremity edema: no Anxiety/depressed mood: no   MENOPAUSAL SYMPTOMS Has intact uterus, last menstrual cycle was years ago.  Is in new relationship and having discomfort vaginally with sexual intercourse.  Reports having urge to have intercourse and libido is there, but discomfort with intercourse.  She tried KY jelly with no benfit. Gravida/Para: 1/1 -- vaginal birth -- forceps Duration: stable Symptom severity: moderate Hot flashes: yes Night sweats: yes Sleep disturbances: no Vaginal dryness: yes Dyspareunia:yes Decreased libido: no Emotional lability: no Stress incontinence: no Previous HRT/pharmacotherapy: no Hysterectomy: no GYN surgery: none Absolute Contraindications to Hormonal Therapy:     Undiagnosed vaginal bleeding: no    Breast cancer: no    Endometrial cancer: no    Coronary disease: no    Cerebrovascular disease: no    Venous thromboembolic disease: no  Relevant past medical, surgical, family and social history reviewed and updated as indicated. Interim medical history since our last visit reviewed. Allergies and medications reviewed and updated.  Review of Systems   Constitutional: Negative for activity change, appetite change, diaphoresis, fatigue and fever.  Respiratory: Negative for cough, chest tightness, shortness of breath and wheezing.   Cardiovascular: Negative for chest pain, palpitations and leg swelling.  Gastrointestinal: Negative.   Endocrine: Negative for cold intolerance, heat intolerance and polydipsia.  Genitourinary: Positive for vaginal pain (with intercourse only).  Neurological: Negative.   Psychiatric/Behavioral: Negative.     Per HPI unless specifically indicated above     Objective:    BP (!) 93/59 (BP Location: Left Arm, Patient Position: Sitting, Cuff Size: Normal)   Pulse 89   Temp 99 F (37.2 C) (Oral)   LMP  (LMP Unknown)   SpO2 97%   Wt Readings from Last 3 Encounters:  04/28/19 142 lb (64.4 kg)  03/29/19 142 lb (64.4 kg)  02/25/19 142 lb (64.4 kg)    Physical Exam Vitals and nursing note reviewed.  Constitutional:      General: She is awake.     Appearance: She is well-developed and well-groomed. She is not ill-appearing.  HENT:     Head: Normocephalic.     Right Ear: Hearing normal.     Left Ear: Hearing normal.  Eyes:     General: Lids are normal.        Right eye: No discharge.        Left eye: No discharge.     Conjunctiva/sclera: Conjunctivae normal.     Pupils: Pupils are equal, round, and reactive to light.  Neck:     Thyroid: No thyromegaly.     Vascular: No carotid bruit.  Cardiovascular:     Rate and Rhythm: Normal rate and regular rhythm.  Heart sounds: Normal heart sounds. No murmur. No gallop.   Pulmonary:     Effort: Pulmonary effort is normal. No accessory muscle usage or respiratory distress.     Breath sounds: Normal breath sounds.  Abdominal:     General: Bowel sounds are normal.     Palpations: Abdomen is soft.  Musculoskeletal:     Cervical back: Normal range of motion and neck supple.     Right lower leg: No edema.     Left lower leg: No edema.  Skin:    General:  Skin is warm and dry.  Neurological:     Mental Status: She is alert and oriented to person, place, and time.  Psychiatric:        Attention and Perception: Attention normal.        Mood and Affect: Mood normal.        Speech: Speech normal.        Behavior: Behavior normal. Behavior is cooperative.    Results for orders placed or performed in visit on 04/28/19  Thyroid Panel With TSH  Result Value Ref Range   TSH 1.440 0.450 - 4.500 uIU/mL   T4, Total 8.0 4.5 - 12.0 ug/dL   T3 Uptake Ratio 30 24 - 39 %   Free Thyroxine Index 2.4 1.2 - 4.9      Assessment & Plan:   Problem List Items Addressed This Visit      Endocrine   Hypothyroidism    Chronic, ongoing.  Continue current Levothyroxine dose.  Adjust dose as needed based on results.  Return in 6 months.      Relevant Medications   levothyroxine (SYNTHROID) 100 MCG tablet     Genitourinary   Menopausal vaginal dryness - Primary    Discussed various options, since main issue is vaginal dryness and discomfort with sexual intercourse, suspect some vaginal atrophy present.  Will start out with vaginal Estrace, script sent.  Return in 4 weeks for follow-up.  If ongoing could consider dual oral therapy or patch (progesterone/estrogen need due to intact uterus).        Other   Anxiety and depression   Relevant Medications   citalopram (CELEXA) 40 MG tablet       Follow up plan: Return in about 4 weeks (around 08/25/2019) for Vaginal atrophy.

## 2019-07-28 NOTE — Assessment & Plan Note (Signed)
Chronic, ongoing.  Continue current Levothyroxine dose.  Adjust dose as needed based on results.  Return in 6 months.

## 2019-08-18 ENCOUNTER — Other Ambulatory Visit: Payer: Self-pay | Admitting: Nurse Practitioner

## 2019-09-08 ENCOUNTER — Ambulatory Visit: Payer: BC Managed Care – PPO | Admitting: Nurse Practitioner

## 2019-10-20 ENCOUNTER — Ambulatory Visit: Payer: BC Managed Care – PPO | Admitting: Nurse Practitioner

## 2019-12-07 ENCOUNTER — Other Ambulatory Visit: Payer: Self-pay | Admitting: Nurse Practitioner

## 2019-12-07 DIAGNOSIS — K219 Gastro-esophageal reflux disease without esophagitis: Secondary | ICD-10-CM

## 2020-01-19 ENCOUNTER — Other Ambulatory Visit: Payer: Self-pay | Admitting: Nurse Practitioner

## 2020-01-19 DIAGNOSIS — K219 Gastro-esophageal reflux disease without esophagitis: Secondary | ICD-10-CM

## 2020-02-09 ENCOUNTER — Other Ambulatory Visit: Payer: Self-pay | Admitting: Family Medicine

## 2020-02-09 ENCOUNTER — Ambulatory Visit
Admission: RE | Admit: 2020-02-09 | Discharge: 2020-02-09 | Disposition: A | Discharge status: Home / Self Care | Payer: BC Managed Care – PPO | Source: Ambulatory Visit | Attending: Nurse Practitioner | Admitting: Nurse Practitioner

## 2020-02-09 ENCOUNTER — Other Ambulatory Visit: Payer: Self-pay | Admitting: Nurse Practitioner

## 2020-02-09 ENCOUNTER — Other Ambulatory Visit: Payer: Self-pay

## 2020-02-09 DIAGNOSIS — Z1231 Encounter for screening mammogram for malignant neoplasm of breast: Secondary | ICD-10-CM

## 2020-06-30 ENCOUNTER — Other Ambulatory Visit: Payer: Self-pay

## 2020-06-30 ENCOUNTER — Encounter: Payer: Self-pay | Admitting: Nurse Practitioner

## 2020-06-30 ENCOUNTER — Ambulatory Visit: Payer: BC Managed Care – PPO | Admitting: Nurse Practitioner

## 2020-06-30 VITALS — BP 113/72 | HR 78 | Temp 98.6°F | Ht 64.76 in | Wt 144.8 lb

## 2020-06-30 DIAGNOSIS — E039 Hypothyroidism, unspecified: Secondary | ICD-10-CM | POA: Diagnosis not present

## 2020-06-30 DIAGNOSIS — F419 Anxiety disorder, unspecified: Secondary | ICD-10-CM | POA: Diagnosis not present

## 2020-06-30 DIAGNOSIS — F32A Depression, unspecified: Secondary | ICD-10-CM

## 2020-06-30 DIAGNOSIS — N1831 Chronic kidney disease, stage 3a: Secondary | ICD-10-CM | POA: Diagnosis not present

## 2020-06-30 DIAGNOSIS — M5431 Sciatica, right side: Secondary | ICD-10-CM | POA: Diagnosis not present

## 2020-06-30 MED ORDER — PREDNISONE 10 MG PO TABS: ORAL_TABLET | ORAL | 0 refills | Status: DC

## 2020-06-30 MED ORDER — CYCLOBENZAPRINE HCL 10 MG PO TABS: 10.0000 mg | ORAL_TABLET | Freq: Three times a day (TID) | ORAL | 0 refills | Status: DC | PRN

## 2020-06-30 MED ORDER — CITALOPRAM HYDROBROMIDE 40 MG PO TABS: 40.0000 mg | ORAL_TABLET | Freq: Every day | ORAL | 4 refills | Status: DC

## 2020-06-30 MED ORDER — LEVOTHYROXINE SODIUM 100 MCG PO TABS: 100.0000 ug | ORAL_TABLET | Freq: Every day | ORAL | 4 refills | Status: DC

## 2020-06-30 NOTE — Assessment & Plan Note (Signed)
Chronic, stable, denies SI/HI.  Continue Celexa daily and Buspar as needed.  Refills as needed.  Return in 6 months.

## 2020-06-30 NOTE — Assessment & Plan Note (Signed)
Noted on past labs, recheck today.  She has cut back on soda and OTC NSAID.  Consider nephrology referral if any worsening.  Return in 6 months.

## 2020-06-30 NOTE — Progress Notes (Signed)
BP 113/72   Pulse 78   Temp 98.6 F (37 C) (Oral)   Ht 5' 4.76" (1.645 m)   Wt 144 lb 12.8 oz (65.7 kg)   LMP  (LMP Unknown)   SpO2 98%   BMI 24.27 kg/m    Subjective:    Patient ID: Laura Soto, female    DOB: 1960/11/17, 60 y.o.   MRN: [id number]  HPI: Laura Soto is a 60 y.o. female  Chief Complaint  Patient presents with  . Sciatic nerve pain    For past few weeks, is getting a little better still having some tightness and pain behind right knee.    BACK PAIN Started a couple week ago after she went to beach and pulled out things + lifts at work as well.  Taking a pain relief pill at home 900 MG.   Duration: weeks Mechanism of injury: no trauma Location: Right Onset: sudden Severity: 10/10 at worst -- today 3-4/10 Quality: dull, aching and burning Frequency: intermittent Radiation: R leg below the knee Aggravating factors: lifting, movement, walking and bending Alleviating factors: NSAIDs Status: stable Treatments attempted: heat and ibuprofen  Relief with NSAIDs?: mild Nighttime pain:  no Paresthesias / decreased sensation:  no Bowel / bladder incontinence:  no Fevers:  no Dysuria / urinary frequency:  no   CHRONIC KIDNEY DISEASE Last labs in 2020 -- GFR 44 and CRT 1.32.  Was taking OTC pain meds at time and drinking lots of Pepsi. CKD status: stable Medications renally dose: yes Previous renal evaluation: no Pneumovax:  Not up to Date Influenza Vaccine:  refuses  HYPOTHYROIDISM Continues on Levothyroxine 100 MCG, last TSH was 1.440 and T4 8.0 in 2020. Thyroid control status:stable Satisfied with current treatment? yes Medication side effects: no Medication compliance: good compliance Etiology of hypothyroidism:  Recent dose adjustment:no Fatigue: no Cold intolerance: no Heat intolerance: no Weight gain: no Weight loss: no Constipation: no Diarrhea/loose stools: no Palpitations: no Lower extremity edema: no Anxiety/depressed mood: no    DEPRESSION Continues on Celexa 40 MG and Buspar 5 MG as needed. Mood status: stable Satisfied with current treatment?: yes Symptom severity: mild  Duration of current treatment : chronic Side effects: no Medication compliance: good compliance Psychotherapy/counseling: none Previous psychiatric medications:Celexa and Buspar Depressed mood: no Anxious mood: no Anhedonia: no Significant weight loss or gain: no Insomnia: occasional Fatigue: no Feelings of worthlessness or guilt: no Impaired concentration/indecisiveness: no Suicidal ideations: no Hopelessness: no Crying spells: no Depression screen [facility] 2/9 06/30/2020 07/13/2018 01/07/2018 10/31/2017 08/16/2016  Decreased Interest 0 0 1 0 0  Down, Depressed, Hopeless 0 0 0 0 0  PHQ - 2 Score 0 0 1 0 0  Altered sleeping - 0 0 0 -  Tired, decreased energy - 0 0 0 -  Change in appetite - 0 0 0 -  Feeling bad or failure about yourself  - 0 0 0 -  Trouble concentrating - 0 0 0 -  Moving slowly or fidgety/restless - 0 0 0 -  Suicidal thoughts - 0 0 0 -  PHQ-9 Score - 0 1 0 -  Difficult doing work/chores - Not difficult at all - - -    Relevant past medical, surgical, family and social history reviewed and updated as indicated. Interim medical history since our last visit reviewed. Allergies and medications reviewed and updated.  Review of Systems  Constitutional: Negative for activity change, appetite change, diaphoresis, fatigue and fever.  Respiratory: Negative for cough, chest tightness  and shortness of breath.   Cardiovascular: Negative for chest pain, palpitations and leg swelling.  Gastrointestinal: Negative.   Endocrine: Negative for cold intolerance and heat intolerance.  Musculoskeletal: Positive for back pain.  Neurological: Negative.   Psychiatric/Behavioral: Negative.     Per HPI unless specifically indicated above     Objective:    BP 113/72   Pulse 78   Temp 98.6 F (37 C) (Oral)   Ht 5' 4.76" (1.645 m)   Wt  144 lb 12.8 oz (65.7 kg)   LMP  (LMP Unknown)   SpO2 98%   BMI 24.27 kg/m   Wt Readings from Last 3 Encounters:  06/30/20 144 lb 12.8 oz (65.7 kg)  04/28/19 142 lb (64.4 kg)  03/29/19 142 lb (64.4 kg)    Physical Exam Vitals and nursing note reviewed.  Constitutional:      General: She is awake. She is not in acute distress.    Appearance: She is well-developed and well-groomed. She is not ill-appearing.  HENT:     Head: Normocephalic.     Right Ear: Hearing normal.     Left Ear: Hearing normal.  Eyes:     General: Lids are normal.        Right eye: No discharge.        Left eye: No discharge.     Conjunctiva/sclera: Conjunctivae normal.     Pupils: Pupils are equal, round, and reactive to light.  Neck:     Thyroid: No thyromegaly.     Vascular: No carotid bruit.  Cardiovascular:     Rate and Rhythm: Normal rate and regular rhythm.     Heart sounds: Normal heart sounds. No murmur heard. No gallop.   Pulmonary:     Effort: Pulmonary effort is normal. No accessory muscle usage or respiratory distress.     Breath sounds: Normal breath sounds.  Abdominal:     General: Bowel sounds are normal.     Palpations: Abdomen is soft.  Musculoskeletal:     Cervical back: Normal range of motion and neck supple.     Lumbar back: No swelling, edema, tenderness or bony tenderness. Decreased range of motion. Positive right straight leg raise test. Negative left straight leg raise test. No scoliosis.     Right lower leg: No edema.     Left lower leg: No edema.     Comments: Decreased flexion and extension, normal lateral and rotation lumbar spine.  Positive right SLR.  Skin:    General: Skin is warm and dry.  Neurological:     Mental Status: She is alert and oriented to person, place, and time.     Deep Tendon Reflexes: Reflexes are normal and symmetric.     Reflex Scores:      Brachioradialis reflexes are 2+ on the right side and 2+ on the left side.      Patellar reflexes are 2+ on  the right side and 2+ on the left side. Psychiatric:        Attention and Perception: Attention normal.        Mood and Affect: Mood normal.        Speech: Speech normal.        Behavior: Behavior normal. Behavior is cooperative.        Thought Content: Thought content normal.     Results for orders placed or performed in visit on 04/28/19  Thyroid Panel With TSH  Result Value Ref Range   TSH 1.440 0.450 -  4.500 uIU/mL   T4, Total 8.0 4.5 - 12.0 ug/dL   T3 Uptake Ratio 30 24 - 39 %   Free Thyroxine Index 2.4 1.2 - 4.9      Assessment & Plan:   Problem List Items Addressed This Visit      Endocrine   Hypothyroidism    Chronic, ongoing.  Continue current Levothyroxine dose and recheck TSH and Free T4 today.  Adjust dose as needed based on results.  Return in 6 months.      Relevant Orders   TSH   T4, free     Nervous and Auditory   Sciatic pain, right    Acute x 2 weeks.  Educated patient on this and stretches to assist.  Prednisone taper and Flexeril sent in.  Recommend she use Tylenol at home as needed.  Alternate heat and ice.  May benefit from massage or chiropractor care.  No red flag signs.  Return for worsening or ongoing pain, if ongoing consider imaging.      Relevant Medications   cyclobenzaprine (FLEXERIL) 10 MG tablet     Genitourinary   CKD (chronic kidney disease) stage 3, GFR 30-59 ml/min (HCC) - Primary    Noted on past labs, recheck today.  She has cut back on soda and OTC NSAID.  Consider nephrology referral if any worsening.  Return in 6 months.      Relevant Orders   Basic metabolic panel     Other   Anxiety and depression    Chronic, stable, denies SI/HI.  Continue Celexa daily and Buspar as needed.  Refills as needed.  Return in 6 months.          Follow up plan: Return in about 6 months (around 12/28/2020) for Hypothyroid and CKD 3.

## 2020-06-30 NOTE — Patient Instructions (Signed)
Cologuard kit for colon cancer screening   Sciatica  Sciatica is pain, weakness, tingling, or loss of feeling (numbness) along the sciatic nerve. The sciatic nerve starts in the lower back and goes down the back of each leg. Sciatica usually goes away on its own or with treatment. Sometimes, sciatica may come back (recur). What are the causes? This condition happens when the sciatic nerve is pinched or has pressure put on it. This may be the result of:  A disk in between the bones of the spine bulging out too far (herniated disk).  Changes in the spinal disks that occur with aging.  A condition that affects a muscle in the butt.  Extra bone growth near the sciatic nerve.  A break (fracture) of the area between your hip bones (pelvis).  Pregnancy.  Tumor. This is rare. What increases the risk? You are more likely to develop this condition if you:  Play sports that put pressure or stress on the spine.  Have poor strength and ease of movement (flexibility).  Have had a back injury in the past.  Have had back surgery.  Sit for long periods of time.  Do activities that involve bending or lifting over and over again.  Are very overweight (obese). What are the signs or symptoms? Symptoms can vary from mild to very bad. They may include:  Any of these problems in the lower back, leg, hip, or butt: ? Mild tingling, loss of feeling, or dull aches. ? Burning sensations. ? Sharp pains.  Loss of feeling in the back of the calf or the sole of the foot.  Leg weakness.  Very bad back pain that makes it hard to move. These symptoms may get worse when you cough, sneeze, or laugh. They may also get worse when you sit or stand for long periods of time. How is this treated? This condition often gets better without any treatment. However, treatment may include:  Changing or cutting back on physical activity when you have pain.  Doing exercises and stretching.  Putting ice or heat  on the affected area.  Medicines that help: ? To relieve pain and swelling. ? To relax your muscles.  Shots (injections) of medicines that help to relieve pain, irritation, and swelling.  Surgery. Follow these instructions at home: Medicines  Take over-the-counter and prescription medicines only as told by your doctor.  Ask your doctor if the medicine prescribed to you: ? Requires you to avoid driving or using heavy machinery. ? Can cause trouble pooping (constipation). You may need to take these steps to prevent or treat trouble pooping:  Drink enough fluids to keep your pee (urine) pale yellow.  Take over-the-counter or prescription medicines.  Eat foods that are high in fiber. These include beans, whole grains, and fresh fruits and vegetables.  Limit foods that are high in fat and sugar. These include fried or sweet foods. Managing pain  If told, put ice on the affected area. ? Put ice in a plastic bag. ? Place a towel between your skin and the bag. ? Leave the ice on for 20 minutes, 2-3 times a day.  If told, put heat on the affected area. Use the heat source that your doctor tells you to use, such as a moist heat pack or a heating pad. ? Place a towel between your skin and the heat source. ? Leave the heat on for 20-30 minutes. ? Remove the heat if your skin turns bright red. This is very  important if you are unable to feel pain, heat, or cold. You may have a greater risk of getting burned.      Activity  Return to your normal activities as told by your doctor. Ask your doctor what activities are safe for you.  Avoid activities that make your symptoms worse.  Take short rests during the day. ? When you rest for a long time, do some physical activity or stretching between periods of rest. ? Avoid sitting for a long time without moving. Get up and move around at least one time each hour.  Exercise and stretch regularly, as told by your doctor.  Do not lift  anything that is heavier than 10 lb (4.5 kg) while you have symptoms of sciatica. ? Avoid lifting heavy things even when you do not have symptoms. ? Avoid lifting heavy things over and over.  When you lift objects, always lift in a way that is safe for your body. To do this, you should: ? Bend your knees. ? Keep the object close to your body. ? Avoid twisting.   General instructions  Stay at a healthy weight.  Wear comfortable shoes that support your feet. Avoid wearing high heels.  Avoid sleeping on a mattress that is too soft or too hard. You might have less pain if you sleep on a mattress that is firm enough to support your back.  Keep all follow-up visits as told by your doctor. This is important. Contact a doctor if:  You have pain that: ? Wakes you up when you are sleeping. ? Gets worse when you lie down. ? Is worse than the pain you have had in the past. ? Lasts longer than 4 weeks.  You lose weight without trying. Get help right away if:  You cannot control when you pee (urinate) or poop (have a bowel movement).  You have weakness in any of these areas and it gets worse: ? Lower back. ? The area between your hip bones. ? Butt. ? Legs.  You have redness or swelling of your back.  You have a burning feeling when you pee. Summary  Sciatica is pain, weakness, tingling, or loss of feeling (numbness) along the sciatic nerve.  This condition happens when the sciatic nerve is pinched or has pressure put on it.  Sciatica can cause pain, tingling, or loss of feeling (numbness) in the lower back, legs, hips, and butt.  Treatment often includes rest, exercise, medicines, and putting ice or heat on the affected area. This information is not intended to replace advice given to you by your health care provider. Make sure you discuss any questions you have with your health care provider. Document Revised: 06/01/2018 Document Reviewed: 06/01/2018 Elsevier Patient Education   [place].

## 2020-06-30 NOTE — Assessment & Plan Note (Signed)
Acute x 2 weeks.  Educated patient on this and stretches to assist.  Prednisone taper and Flexeril sent in.  Recommend she use Tylenol at home as needed.  Alternate heat and ice.  May benefit from massage or chiropractor care.  No red flag signs.  Return for worsening or ongoing pain, if ongoing consider imaging.

## 2020-06-30 NOTE — Assessment & Plan Note (Signed)
Chronic, ongoing.  Continue current Levothyroxine dose and recheck TSH and Free T4 today.  Adjust dose as needed based on results.  Return in 6 months.

## 2020-07-01 LAB — BASIC METABOLIC PANEL
BUN/Creatinine Ratio: 18 (ref 12–28)
BUN: 24 mg/dL (ref 8–27)
CO2: 20 mmol/L (ref 20–29)
Calcium: 9.6 mg/dL (ref 8.7–10.3)
Chloride: 103 mmol/L (ref 96–106)
Creatinine, Ser: 1.31 mg/dL — ABNORMAL HIGH (ref 0.57–1.00)
GFR calc Af Amer: 51 mL/min/{1.73_m2} — ABNORMAL LOW (ref >59)
GFR calc non Af Amer: 44 mL/min/{1.73_m2} — ABNORMAL LOW (ref >59)
Glucose: 105 mg/dL — ABNORMAL HIGH (ref 65–99)
Potassium: 4.3 mmol/L (ref 3.5–5.2)
Sodium: 138 mmol/L (ref 134–144)

## 2020-07-01 LAB — T4, FREE: Free T4: 1.18 ng/dL (ref 0.82–1.77)

## 2020-07-01 LAB — TSH: TSH: 11.9 u[IU]/mL — ABNORMAL HIGH (ref 0.450–4.500)

## 2020-07-02 NOTE — Progress Notes (Signed)
Please call Laura Soto, as not sure she checks MyChart.  Alert her labs have returned and her TSH, thyroid lab is elevated, but Free T4 normal.  Is she taking her Levothyroxine every day, first thing in morning 30 minutes before other medications or food?  If she is taking as directed, then we need to increase Levothyroxine dose to 125 MCG and recheck these labs via outpatient lab visit only in 6 weeks.  I will send this in if needed.  Please let me know and schedule lab visit if needed.  Kidney function continues to show kidney disease stage 3, with no decline.  We will monitor this closely and may consider imaging and further labs in future.  Have a great day!! Keep being awesome!!  Thank you for allowing me to participate in your care. Kindest regards, Haidee Stogsdill

## 2020-07-03 ENCOUNTER — Other Ambulatory Visit: Payer: Self-pay | Admitting: Nurse Practitioner

## 2020-07-03 DIAGNOSIS — E039 Hypothyroidism, unspecified: Secondary | ICD-10-CM

## 2020-07-03 MED ORDER — LEVOTHYROXINE SODIUM 125 MCG PO TABS: 125.0000 ug | ORAL_TABLET | Freq: Every day | ORAL | 3 refills | Status: DC

## 2020-07-24 ENCOUNTER — Telehealth: Payer: Self-pay

## 2020-07-24 NOTE — Telephone Encounter (Signed)
Please advise pt seen 2/4  Copied from [place] [id number]. Topic: General - Inquiry >> Jul 24, 2020  2:41 PM Greggory Keen D wrote: Reason for CRM: Pt called saying just finished a dose of prednisone and is still having sciatica pain.  She wants to know if Jolene would send another Rx to the pharmacy.  CB#  [phone]

## 2020-07-24 NOTE — Telephone Encounter (Signed)
Called pt she states that she is going to a chiropractor tomorrow and see what they say first

## 2020-07-24 NOTE — Telephone Encounter (Signed)
If having ongoing pain recommend return to office for further assessment as may need to pursue imaging and alternate treatments.

## 2020-07-24 NOTE — Telephone Encounter (Signed)
Noted  

## 2020-08-08 ENCOUNTER — Other Ambulatory Visit: Payer: Self-pay | Admitting: Nurse Practitioner

## 2020-08-08 NOTE — Telephone Encounter (Signed)
Requested Prescriptions  Pending Prescriptions Disp Refills  . busPIRone (BUSPAR) 5 MG tablet [Pharmacy Med Name: BUSPIRONE HCL 5 MG TABLET] 60 tablet 0    Sig: Take 1 tablet (5 mg total) by mouth 2 (two) times daily as needed.     Psychiatry: Anxiolytics/Hypnotics - Non-controlled Passed - 08/08/2020  2:26 PM      Passed - Valid encounter within last 6 months    Recent Outpatient Visits          1 month ago Stage 3a chronic kidney disease ([place])   [place] Cannady, Barbaraann Faster, NP   1 year ago Menopausal vaginal dryness   [place], [place] T, NP   1 year ago Hypothyroidism, unspecified type   [place], Henrine Screws T, NP   1 year ago Hypothyroidism, unspecified type   [facility] [place], Henrine Screws T, NP   1 year ago Hypothyroidism, unspecified type   [facility] [place] Kathrine Haddock, NP      Future Appointments            In 5 months Cannady, Barbaraann Faster, NP [employer] [employer]

## 2020-08-15 ENCOUNTER — Other Ambulatory Visit: Payer: Self-pay

## 2020-09-29 ENCOUNTER — Other Ambulatory Visit: Payer: Self-pay | Admitting: Nurse Practitioner

## 2020-10-10 ENCOUNTER — Ambulatory Visit: Payer: BC Managed Care – PPO | Admitting: Nurse Practitioner

## 2020-10-11 ENCOUNTER — Encounter: Payer: Self-pay | Admitting: Nurse Practitioner

## 2020-10-11 ENCOUNTER — Telehealth (INDEPENDENT_AMBULATORY_CARE_PROVIDER_SITE_OTHER): Payer: BC Managed Care – PPO | Admitting: Nurse Practitioner

## 2020-10-11 ENCOUNTER — Other Ambulatory Visit: Payer: Self-pay

## 2020-10-11 DIAGNOSIS — J0100 Acute maxillary sinusitis, unspecified: Secondary | ICD-10-CM | POA: Diagnosis not present

## 2020-10-11 MED ORDER — AZITHROMYCIN 250 MG PO TABS: ORAL_TABLET | ORAL | 0 refills | Status: AC

## 2020-10-11 NOTE — Progress Notes (Signed)
LMP  (LMP Unknown)    Subjective:    Patient ID: Laura Soto, female    DOB: 05-06-1961, 60 y.o.   MRN: [id number]  HPI: [name] is a 60 y.o. female  Chief Complaint  Patient presents with  . URI    Patient states that she has been feeling bad for a couple weeks, she states that her ears are clogged up, cough, no fever or diarrhea   UPPER RESPIRATORY TRACT INFECTION Worst symptom: symptoms have been going on for about 2 weeks. Fever: no Cough: yes Shortness of breath: no Wheezing: no Chest pain: no Chest tightness: no Chest congestion: no Nasal congestion: yes Runny nose: yes Post nasal drip: yes Sneezing: no Sore throat: irritated Swollen glands: no Sinus pressure: yes Headache: yes Face pain: yes Toothache: no Ear pain: no bilateral Ear pressure: yes bilateral Eyes red/itching:no Eye drainage/crusting: no  Vomiting: no Rash: no Fatigue: yes Sick contacts: no Strep contacts: no  Context: worse Recurrent sinusitis: no Relief with OTC cold/cough medications: no  Treatments attempted: none   Relevant past medical, surgical, family and social history reviewed and updated as indicated. Interim medical history since our last visit reviewed. Allergies and medications reviewed and updated.  Review of Systems  Constitutional: Positive for fatigue. Negative for fever.  HENT: Positive for congestion, postnasal drip, rhinorrhea, sinus pressure and sinus pain. Negative for dental problem, ear pain, sneezing and sore throat.   Respiratory: Positive for cough. Negative for shortness of breath and wheezing.   Cardiovascular: Negative for chest pain.  Gastrointestinal: Negative for vomiting.  Skin: Negative for rash.  Neurological: Positive for headaches.    Per HPI unless specifically indicated above     Objective:    LMP  (LMP Unknown)   Wt Readings from Last 3 Encounters:  06/30/20 144 lb 12.8 oz (65.7 kg)  04/28/19 142 lb (64.4 kg)  03/29/19 142 lb  (64.4 kg)    Physical Exam Vitals and nursing note reviewed.  HENT:     Head: Normocephalic.     Right Ear: Hearing normal.     Left Ear: Hearing normal.     Nose: Nose normal.  Eyes:     Pupils: Pupils are equal, round, and reactive to light.  Pulmonary:     Effort: Pulmonary effort is normal. No respiratory distress.  Neurological:     Mental Status: She is alert.  Psychiatric:        Mood and Affect: Mood normal.        Behavior: Behavior normal.        Thought Content: Thought content normal.        Judgment: Judgment normal.     Results for orders placed or performed in visit on 06/30/20  TSH  Result Value Ref Range   TSH 11.900 (H) 0.450 - 4.500 uIU/mL  T4, free  Result Value Ref Range   Free T4 1.18 0.82 - 1.77 ng/dL  Basic metabolic panel  Result Value Ref Range   Glucose 105 (H) 65 - 99 mg/dL   BUN 24 8 - 27 mg/dL   Creatinine, Ser 1.31 (H) 0.57 - 1.00 mg/dL   GFR calc non Af Amer 44 (L) >59 mL/min/1.73   GFR calc Af Amer 51 (L) >59 mL/min/1.73   BUN/Creatinine Ratio 18 12 - 28   Sodium 138 134 - 144 mmol/L   Potassium 4.3 3.5 - 5.2 mmol/L   Chloride 103 96 - 106 mmol/L   CO2 20 20 -  29 mmol/L   Calcium 9.6 8.7 - 10.3 mg/dL      Assessment & Plan:   Problem List Items Addressed This Visit   None   Visit Diagnoses    Acute non-recurrent maxillary sinusitis    -  Primary   Complete course of antibiotics. Return to clinic if symptoms do not improve.    Relevant Medications   azithromycin (ZITHROMAX) 250 MG tablet       Follow up plan: Return if symptoms worsen or fail to improve.    This visit was completed via MyChart due to the restrictions of the COVID-19 pandemic. All issues as above were discussed and addressed. Physical exam was done as above through visual confirmation on MyChart. If it was felt that the patient should be evaluated in the office, they were directed there. The patient verbally consented to this visit. 1. Location of the  patient: Outside 2. Location of the provider: Office 3. Those involved with this call:  ? Provider: Jon Billings, NP ? CMA: Tiffany Reel, CMA ? Front Desk/Registration: Levert Feinstein 4. Time spent on call: 15 minutes with patient face to face via video conference. More than 50% of this time was spent in counseling and coordination of care. 20 minutes total spent in review of patient's record and preparation of their chart.

## 2020-10-11 NOTE — Progress Notes (Signed)
BP 119/80   Pulse 81   Temp 99.5 F (37.5 C)   LMP  (LMP Unknown)   SpO2 98% t   Subjective:    Patient ID: Laura Soto, female    DOB: 08/19/1960, 60 y.o.   MRN: [id number]  HPI: Laura Soto is a 60 y.o. female  Chief Complaint  Patient presents with  . Depression  . Anxiety  . Hypothyroidism   HYPOTHYROIDISM Patient feels like her dose might be off. Would like to have her labs checked.  Thyroid control status:stable Satisfied with current treatment? yes Medication side effects: no Medication compliance: excellent compliance Etiology of hypothyroidism:  Recent dose adjustment:no Fatigue: yes Cold intolerance: no Heat intolerance: no Weight gain: no Weight loss: no Constipation: no Diarrhea/loose stools: no Palpitations: no Lower extremity edema: no Anxiety/depressed mood: yes  ANXIETY/DEPRESSION Patient states she feels like her depression and anxiety is not well controlled. She feels like she is getting more irritated than she used to.  Work has been stressful and she feels like it is affecting her mood.  Denies SI.  Denies HA, CP, SOB, dizziness, palpitations, visual changes, and lower extremity swelling.  Relevant past medical, surgical, family and social history reviewed and updated as indicated. Interim medical history since our last visit reviewed. Allergies and medications reviewed and updated.  Review of Systems  Constitutional: Negative for fatigue, fever and unexpected weight change.  Eyes: Negative for visual disturbance.  Respiratory: Negative for cough, chest tightness and shortness of breath.   Cardiovascular: Negative for chest pain, palpitations and leg swelling.  Endocrine: Negative for cold intolerance and heat intolerance.  Neurological: Negative for dizziness and headaches.  Psychiatric/Behavioral: Positive for dysphoric mood. Negative for suicidal ideas. The patient is nervous/anxious.     Per HPI unless specifically indicated  above     Objective:    BP 119/80   Pulse 81   Temp 99.5 F (37.5 C)   LMP  (LMP Unknown)   SpO2 98%   Wt Readings from Last 3 Encounters:  06/30/20 144 lb 12.8 oz (65.7 kg)  04/28/19 142 lb (64.4 kg)  03/29/19 142 lb (64.4 kg)    Physical Exam Vitals and nursing note reviewed.  Constitutional:      General: She is not in acute distress.    Appearance: Normal appearance. She is normal weight. She is not ill-appearing, toxic-appearing or diaphoretic.  HENT:     Head: Normocephalic.     Right Ear: External ear normal.     Left Ear: External ear normal.     Nose: Nose normal.     Mouth/Throat:     Mouth: Mucous membranes are moist.     Pharynx: Oropharynx is clear.  Eyes:     General:        Right eye: No discharge.        Left eye: No discharge.     Extraocular Movements: Extraocular movements intact.     Conjunctiva/sclera: Conjunctivae normal.     Pupils: Pupils are equal, round, and reactive to light.  Cardiovascular:     Rate and Rhythm: Normal rate and regular rhythm.     Heart sounds: No murmur heard.   Pulmonary:     Effort: Pulmonary effort is normal. No respiratory distress.     Breath sounds: Normal breath sounds. No wheezing or rales.  Musculoskeletal:     Cervical back: Normal range of motion and neck supple.  Skin:    General: Skin is warm  and dry.     Capillary Refill: Capillary refill takes less than 2 seconds.  Neurological:     General: No focal deficit present.     Mental Status: She is alert and oriented to person, place, and time. Mental status is at baseline.  Psychiatric:        Mood and Affect: Mood normal.        Behavior: Behavior normal.        Thought Content: Thought content normal.        Judgment: Judgment normal.     Results for orders placed or performed in visit on 06/30/20  TSH  Result Value Ref Range   TSH 11.900 (H) 0.450 - 4.500 uIU/mL  T4, free  Result Value Ref Range   Free T4 1.18 0.82 - 1.77 ng/dL  Basic  metabolic panel  Result Value Ref Range   Glucose 105 (H) 65 - 99 mg/dL   BUN 24 8 - 27 mg/dL   Creatinine, Ser 1.31 (H) 0.57 - 1.00 mg/dL   GFR calc non Af Amer 44 (L) >59 mL/min/1.73   GFR calc Af Amer 51 (L) >59 mL/min/1.73   BUN/Creatinine Ratio 18 12 - 28   Sodium 138 134 - 144 mmol/L   Potassium 4.3 3.5 - 5.2 mmol/L   Chloride 103 96 - 106 mmol/L   CO2 20 20 - 29 mmol/L   Calcium 9.6 8.7 - 10.3 mg/dL      Assessment & Plan:   Problem List Items Addressed This Visit      Endocrine   Hypothyroidism - Primary    Labs ordered today.  Will make recommendations based on lab results.       Relevant Orders   TSH   T4, free     Other   Anxiety and depression    Chronic. Uncontrolled.  On max dose of Celexa.  Would like to increase the Buspar and take it regularly. Continue with Celexa and take Buspar 10mg  BID.  Return in 1 month for reevaluation.       Relevant Medications   busPIRone (BUSPAR) 10 MG tablet       Follow up plan: Return in about 1 month (around 11/12/2020) for Depression/Anxiety FU.

## 2020-10-12 ENCOUNTER — Encounter: Payer: Self-pay | Admitting: Nurse Practitioner

## 2020-10-12 ENCOUNTER — Other Ambulatory Visit: Payer: Self-pay

## 2020-10-12 ENCOUNTER — Ambulatory Visit: Payer: BC Managed Care – PPO | Admitting: Nurse Practitioner

## 2020-10-12 VITALS — BP 119/80 | HR 81 | Temp 99.5°F

## 2020-10-12 DIAGNOSIS — F419 Anxiety disorder, unspecified: Secondary | ICD-10-CM | POA: Diagnosis not present

## 2020-10-12 DIAGNOSIS — F32A Depression, unspecified: Secondary | ICD-10-CM

## 2020-10-12 DIAGNOSIS — E039 Hypothyroidism, unspecified: Secondary | ICD-10-CM

## 2020-10-12 MED ORDER — BUSPIRONE HCL 10 MG PO TABS: 10.0000 mg | ORAL_TABLET | Freq: Two times a day (BID) | ORAL | 1 refills | Status: DC | PRN

## 2020-10-12 NOTE — Assessment & Plan Note (Signed)
Labs ordered today.  Will make recommendations based on lab results. ?

## 2020-10-12 NOTE — Assessment & Plan Note (Signed)
Chronic. Uncontrolled.  On max dose of Celexa.  Would like to increase the Buspar and take it regularly. Continue with Celexa and take Buspar 10mg  BID.  Return in 1 month for reevaluation.

## 2020-10-13 LAB — T4, FREE: Free T4: 1.69 ng/dL (ref 0.82–1.77)

## 2020-10-13 LAB — TSH: TSH: 0.421 u[IU]/mL — ABNORMAL LOW (ref 0.450–4.500)

## 2020-10-13 MED ORDER — LEVOTHYROXINE SODIUM 112 MCG PO TABS: 112.0000 ug | ORAL_TABLET | Freq: Every day | ORAL | 0 refills | Status: DC

## 2020-10-13 NOTE — Progress Notes (Signed)
Please let patient know that her thyroid labs show that she is receiving too much of the thyroid hormone.  We need to decrease her dose of levothyroxine.  I will send in the 178mcg dose of levothyroxine dose for her. She should return in 6 weeks to have her labs repeated and see if her levels are back within normal range.  I have sent the new dose of medication to the pharmacy.

## 2020-10-13 NOTE — Addendum Note (Signed)
Addended by: Jon Billings on: 10/13/2020 03:36 PM   Modules accepted: Orders

## 2020-10-16 ENCOUNTER — Encounter: Payer: Self-pay | Admitting: Nurse Practitioner

## 2020-11-08 ENCOUNTER — Ambulatory Visit: Payer: BC Managed Care – PPO | Admitting: Nurse Practitioner

## 2021-01-04 ENCOUNTER — Other Ambulatory Visit: Payer: Self-pay | Admitting: Nurse Practitioner

## 2021-01-04 NOTE — Telephone Encounter (Signed)
   Notes to clinic:  Patient has appt schedule on 01/10/2021 Should have enough medication until appt   Requested Prescriptions  Pending Prescriptions Disp Refills   levothyroxine (SYNTHROID) 112 MCG tablet [Pharmacy Med Name: LEVOTHYROXINE 112 MCG TABLET] 90 tablet 0    Sig: Take 1 tablet (112 mcg total) by mouth daily.     Endocrinology:  Hypothyroid Agents Failed - 01/04/2021  9:48 AM      Failed - TSH needs to be rechecked within 3 months after an abnormal result. Refill until TSH is due.      Failed - TSH in normal range and within 360 days    TSH  Date Value Ref Range Status  10/12/2020 0.421 (L) 0.450 - 4.500 uIU/mL Final          Passed - Valid encounter within last 12 months    Recent Outpatient Visits           2 months ago Hypothyroidism, unspecified type   [facility] Jon Billings, NP   2 months ago Acute non-recurrent maxillary sinusitis   [facility] Jon Billings, NP   6 months ago Stage 3a chronic kidney disease ([place])   [place], Barbaraann Faster, NP   1 year ago Menopausal vaginal dryness   [place], [place] T, NP   1 year ago Hypothyroidism, unspecified type   [place], [place] T, NP       Future Appointments             In 6 days Cannady, Barbaraann Faster, NP [facility]             busPIRone (BUSPAR) 10 MG tablet [Pharmacy Med Name: BUSPIRONE HCL 10 MG TABLET] 60 tablet 0    Sig: Take 1 tablet (10 mg total) by mouth 2 (two) times daily as needed.     Psychiatry: Anxiolytics/Hypnotics - Non-controlled Passed - 01/04/2021  9:48 AM      Passed - Valid encounter within last 6 months    Recent Outpatient Visits           2 months ago Hypothyroidism, unspecified type   [facility] Jon Billings, NP   2 months ago Acute non-recurrent maxillary sinusitis   [facility] Jon Billings, NP   6 months ago Stage 3a  chronic kidney disease ([place])   [place], Barbaraann Faster, NP   1 year ago Menopausal vaginal dryness   [place], [place] T, NP   1 year ago Hypothyroidism, unspecified type   [place], Barbaraann Faster, NP       Future Appointments             In 6 days Cannady, Barbaraann Faster, NP [facility]

## 2021-01-04 NOTE — Telephone Encounter (Signed)
Called and spoke with patient, she had to reschedule appt until 02/07/21, she does need a refill to get to this appt.

## 2021-01-10 ENCOUNTER — Ambulatory Visit: Payer: BC Managed Care – PPO | Admitting: Nurse Practitioner

## 2021-02-07 ENCOUNTER — Ambulatory Visit: Payer: BC Managed Care – PPO | Admitting: Nurse Practitioner

## 2021-02-07 ENCOUNTER — Encounter: Payer: Self-pay | Admitting: Nurse Practitioner

## 2021-02-07 ENCOUNTER — Other Ambulatory Visit: Payer: Self-pay

## 2021-02-07 VITALS — BP 138/85 | HR 70 | Temp 99.4°F | Wt 148.0 lb

## 2021-02-07 DIAGNOSIS — E039 Hypothyroidism, unspecified: Secondary | ICD-10-CM

## 2021-02-07 DIAGNOSIS — F32A Depression, unspecified: Secondary | ICD-10-CM | POA: Diagnosis not present

## 2021-02-07 DIAGNOSIS — N1831 Chronic kidney disease, stage 3a: Secondary | ICD-10-CM

## 2021-02-07 DIAGNOSIS — F419 Anxiety disorder, unspecified: Secondary | ICD-10-CM

## 2021-02-07 MED ORDER — BUSPIRONE HCL 10 MG PO TABS: 10.0000 mg | ORAL_TABLET | Freq: Two times a day (BID) | ORAL | 4 refills | Status: DC | PRN

## 2021-02-07 NOTE — Assessment & Plan Note (Signed)
Chronic, ongoing.  Continue current Levothyroxine dose and recheck TSH and Free T4 today, will add on thyroid antibody too.  Adjust dose as needed based on results.  Return in 6 months.

## 2021-02-07 NOTE — Progress Notes (Signed)
BP 138/85   Pulse 70   Temp 99.4 F (37.4 C) (Oral)   Wt 148 lb (67.1 kg)   LMP  (LMP Unknown)   SpO2 98%   BMI 24.81 kg/m    Subjective:    Patient ID: Laura Soto, female    DOB: 12/10/1960, 60 y.o.   MRN: [id number]  HPI: Laura Soto is a 60 y.o. female  Chief Complaint  Patient presents with   Hypothyroidism    Patient states she feels everything is going well. Patient states they did have to switch her medications and it has helped her and she thinks that may be why she has gain a couple of pounds.    Chronic Kidney Disease   CHRONIC KIDNEY DISEASE Noted on labs initially 08/16/2016.  Was taking OTC pain meds at time and drinking lots of Pepsi. CKD status: stable Medications renally dose: yes Previous renal evaluation: no Pneumovax:  refuses Influenza Vaccine:   refuses  HYPOTHYROIDISM Continues on Levothyroxine 112 MCG. Thyroid control status:stable Satisfied with current treatment? yes Medication side effects: no Medication compliance: good compliance Etiology of hypothyroidism:  Recent dose adjustment:no Fatigue: no Cold intolerance: no Heat intolerance: no Weight gain: no Weight loss: no Constipation: no Diarrhea/loose stools: no Palpitations: no Lower extremity edema: no Anxiety/depressed mood: no   DEPRESSION Continues on Celexa 40 MG and Buspar 5 MG as needed. Mood status: stable Satisfied with current treatment?: yes Symptom severity: mild  Duration of current treatment : chronic Side effects: no Medication compliance: good compliance Psychotherapy/counseling: none Previous psychiatric medications:Celexa and Buspar Depressed mood: no Anxious mood: no Anhedonia: no Significant weight loss or gain: no Insomnia: occasional Fatigue: no Feelings of worthlessness or guilt: no Impaired concentration/indecisiveness: no Suicidal ideations: no Hopelessness: no Crying spells: no Depression screen [facility] 2/9 02/07/2021 10/12/2020 06/30/2020  07/13/2018 01/07/2018  Decreased Interest 0 0 0 0 1  Down, Depressed, Hopeless 0 0 0 0 0  PHQ - 2 Score 0 0 0 0 1  Altered sleeping 0 - - 0 0  Tired, decreased energy 0 - - 0 0  Change in appetite 0 - - 0 0  Feeling bad or failure about yourself  0 - - 0 0  Trouble concentrating 0 - - 0 0  Moving slowly or fidgety/restless 0 - - 0 0  Suicidal thoughts 0 - - 0 0  PHQ-9 Score 0 - - 0 1  Difficult doing work/chores Not difficult at all - - Not difficult at all -   GAD 7 : Generalized Anxiety Score 02/07/2021 10/12/2020 07/13/2018  Nervous, Anxious, on Edge 0 3 1  Control/stop worrying 0 3 0  Worry too much - different things 0 3 1  Trouble relaxing 0 2 0  Restless 0 2 0  Easily annoyed or irritable 0 3 0  Afraid - awful might happen 0 0 0  Total GAD 7 Score 0 16 2  Anxiety Difficulty Not difficult at all Very difficult Not difficult at all    Relevant past medical, surgical, family and social history reviewed and updated as indicated. Interim medical history since our last visit reviewed. Allergies and medications reviewed and updated.  Review of Systems  Constitutional:  Negative for activity change, appetite change, diaphoresis, fatigue and fever.  Respiratory:  Negative for cough, chest tightness and shortness of breath.   Cardiovascular:  Negative for chest pain, palpitations and leg swelling.  Gastrointestinal: Negative.   Endocrine: Negative for cold intolerance and heat intolerance.  Neurological: Negative.   Psychiatric/Behavioral: Negative.     Per HPI unless specifically indicated above     Objective:    BP 138/85   Pulse 70   Temp 99.4 F (37.4 C) (Oral)   Wt 148 lb (67.1 kg)   LMP  (LMP Unknown)   SpO2 98%   BMI 24.81 kg/m   Wt Readings from Last 3 Encounters:  02/07/21 148 lb (67.1 kg)  06/30/20 144 lb 12.8 oz (65.7 kg)  04/28/19 142 lb (64.4 kg)    Physical Exam Vitals and nursing note reviewed.  Constitutional:      General: She is awake. She is not  in acute distress.    Appearance: She is well-developed and well-groomed. She is not ill-appearing.  HENT:     Head: Normocephalic.     Right Ear: Hearing normal.     Left Ear: Hearing normal.  Eyes:     General: Lids are normal.        Right eye: No discharge.        Left eye: No discharge.     Conjunctiva/sclera: Conjunctivae normal.     Pupils: Pupils are equal, round, and reactive to light.  Neck:     Thyroid: No thyromegaly.     Vascular: No carotid bruit.  Cardiovascular:     Rate and Rhythm: Normal rate and regular rhythm.     Heart sounds: Normal heart sounds. No murmur heard.   No gallop.  Pulmonary:     Effort: Pulmonary effort is normal. No accessory muscle usage or respiratory distress.     Breath sounds: Normal breath sounds.  Abdominal:     General: Bowel sounds are normal.     Palpations: Abdomen is soft.  Musculoskeletal:     Cervical back: Normal range of motion and neck supple.     Right lower leg: No edema.     Left lower leg: No edema.  Skin:    General: Skin is warm and dry.  Neurological:     Mental Status: She is alert and oriented to person, place, and time.     Deep Tendon Reflexes: Reflexes are normal and symmetric.     Reflex Scores:      Brachioradialis reflexes are 2+ on the right side and 2+ on the left side.      Patellar reflexes are 2+ on the right side and 2+ on the left side. Psychiatric:        Attention and Perception: Attention normal.        Mood and Affect: Mood normal.        Speech: Speech normal.        Behavior: Behavior normal. Behavior is cooperative.        Thought Content: Thought content normal.   Results for orders placed or performed in visit on 10/12/20  TSH  Result Value Ref Range   TSH 0.421 (L) 0.450 - 4.500 uIU/mL  T4, free  Result Value Ref Range   Free T4 1.69 0.82 - 1.77 ng/dL      Assessment & Plan:   Problem List Items Addressed This Visit       Endocrine   Hypothyroidism    Chronic, ongoing.   Continue current Levothyroxine dose and recheck TSH and Free T4 today, will add on thyroid antibody too.  Adjust dose as needed based on results.  Return in 6 months.      Relevant Orders   TSH   Thyroid peroxidase antibody  T4, free     Genitourinary   CKD (chronic kidney disease) stage 3, GFR 30-59 ml/min (HCC) - Primary    Noted on labs since 2018.  She has cut back on soda and OTC NSAID.  Consider nephrology referral if any worsening.  Check CMP, PTH, CBC.  Check urine ALB next visit.  BP mild elevation today, consider addition of ACE or ARB next visit if ongoing.  Could also consider [facility] which now has CKD coverage.  Return in 6 months.      Relevant Orders   Comprehensive metabolic panel   PTH, Intact and Calcium   CBC with Differential/Platelet     Other   Anxiety and depression    Chronic, stable, denies SI/HI.  Continue Celexa daily and Buspar as needed.  Refills as needed.  Return in 6 months.      Relevant Medications   busPIRone (BUSPAR) 10 MG tablet     Follow up plan: Return in about 6 months (around 08/07/2021) for THYROID, CKD, MOOD -- will do pap as well.

## 2021-02-07 NOTE — Assessment & Plan Note (Signed)
Chronic, stable, denies SI/HI.  Continue Celexa daily and Buspar as needed.  Refills as needed.  Return in 6 months.

## 2021-02-07 NOTE — Assessment & Plan Note (Addendum)
Noted on labs since 2018.  She has cut back on soda and OTC NSAID.  Consider nephrology referral if any worsening.  Check CMP, PTH, CBC.  Check urine ALB next visit.  BP mild elevation today, consider addition of ACE or ARB next visit if ongoing.  Could also consider [facility] which now has CKD coverage.  Return in 6 months.

## 2021-02-07 NOTE — Patient Instructions (Signed)
Chronic Kidney Disease, Adult Chronic kidney disease is when lasting damage happens to the kidneys slowly over a long time. The kidneys help to: Make pee (urine). Make hormones. Keep the right amount of fluids and chemicals in the body. Most often, this disease does not go away. You must take steps to help keep the kidney damage from getting worse. If steps are not taken, the kidneys might stop working forever. What are the causes? Diabetes. High blood pressure. Diseases that affect the heart and blood vessels. Other kidney diseases. Diseases of the body's disease-fighting system. A problem with the flow of pee. Infections of the organs that make pee, store it, and take it out of the body. Swelling or irritation of your blood vessels. What increases the risk? Getting older. Having someone in your family who has kidney disease or kidney failure. Having a disease caused by genes. Taking medicines often that harm the kidneys. Being near or having contact with harmful substances. Being very overweight. Using tobacco now or in the past. What are the signs or symptoms? Feeling very tired. Having a swollen face, legs, ankles, or feet. Feeling like you may vomit or vomiting. Not feeling hungry. Being confused or not able to focus. Twitches and cramps in the leg muscles or other muscles. Dry, itchy skin. A taste of metal in your mouth. Making less pee, or making more pee. Shortness of breath. Trouble sleeping. You may also become anemic or get weak bones. Anemic means there is not enough red blood cells or hemoglobin in your blood. You may get symptoms slowly. You may not notice them until the kidney damage gets very bad. How is this treated? Often, there is no cure for this disease. Treatment can help with symptoms and help keep the disease from getting worse. You may need to: Avoid alcohol. Avoid foods that are high in salt, potassium, phosphorous, and protein. Take medicines for  symptoms and to help control other conditions. Have dialysis. This treatment gets harmful waste out of your body. Treat other problems that cause your kidney disease or make it worse. Follow these instructions at home: Medicines Take over-the-counter and prescription medicines only as told by your doctor. Do not take any new medicines, vitamins, or supplements unless your doctor says it is okay. Lifestyle  Do not smoke or use any products that contain nicotine or tobacco. If you need help quitting, ask your doctor. If you drink alcohol: Limit how much you use to: 0-1 drink a day for women who are not pregnant. 0-2 drinks a day for men. Know how much alcohol is in your drink. In the [place] one drink equals one 12 oz bottle of beer (355 mL), one 5 oz glass of wine (148 mL), or one 1 oz glass of hard liquor (44 mL). Stay at a healthy weight. If you need help losing weight, ask your doctor. General instructions  Follow instructions from your doctor about what you cannot eat or drink. Track your blood pressure at home. Tell your doctor about any changes. If you have diabetes, track your blood sugar. Exercise at least 30 minutes a day, 5 days a week. Keep your shots (vaccinations) up to date. Keep all follow-up visits. Where to find more information American Association of Kidney Patients: www.aakp.org National Kidney Foundation: www.kidney.org American Kidney Fund: www.akfinc.org Life Options: www.lifeoptions.org Kidney School: www.kidneyschool.org Contact a doctor if: Your symptoms get worse. You get new symptoms. Get help right away if: You get symptoms of end-stage kidney disease. These   include: Headaches. Losing feeling in your hands or feet. Easy bruising. Having hiccups often. Chest pain. Shortness of breath. Lack of menstrual periods, in women. You have a fever. You make less pee than normal. You have pain or you bleed when you pee or poop. These symptoms may be an  emergency. Get help right away. Call your local emergency services (911 in the [place] Do not wait to see if the symptoms will go away. Do not drive yourself to the hospital. Summary Chronic kidney disease is when lasting damage happens to the kidneys slowly over a long time. Causes of this disease include diabetes and high blood pressure. Often, there is no cure for this disease. Treatment can help symptoms and help keep the disease from getting worse. Treatment may involve lifestyle changes, medicines, and dialysis. This information is not intended to replace advice given to you by your health care provider. Make sure you discuss any questions you have with your health care provider. Document Revised: 08/18/2019 Document Reviewed: 08/18/2019 Elsevier Patient Education  2022 Elsevier Inc.  

## 2021-02-08 ENCOUNTER — Encounter: Payer: Self-pay | Admitting: Nurse Practitioner

## 2021-02-08 LAB — COMPREHENSIVE METABOLIC PANEL
ALT: 12 IU/L (ref 0–32)
AST: 18 IU/L (ref 0–40)
Albumin/Globulin Ratio: 1.8 (ref 1.2–2.2)
Albumin: 4.8 g/dL (ref 3.8–4.8)
Alkaline Phosphatase: 98 IU/L (ref 44–121)
BUN/Creatinine Ratio: 17 (ref 12–28)
BUN: 24 mg/dL (ref 8–27)
Bilirubin Total: <0.2 mg/dL (ref 0.0–1.2)
CO2: 19 mmol/L — ABNORMAL LOW (ref 20–29)
Calcium: 9.7 mg/dL (ref 8.7–10.3)
Chloride: 102 mmol/L (ref 96–106)
Creatinine, Ser: 1.42 mg/dL — ABNORMAL HIGH (ref 0.57–1.00)
Globulin, Total: 2.6 g/dL (ref 1.5–4.5)
Glucose: 83 mg/dL (ref 65–99)
Potassium: 4.5 mmol/L (ref 3.5–5.2)
Sodium: 137 mmol/L (ref 134–144)
Total Protein: 7.4 g/dL (ref 6.0–8.5)
eGFR: 42 mL/min/{1.73_m2} — ABNORMAL LOW (ref >59)

## 2021-02-08 LAB — CBC WITH DIFFERENTIAL/PLATELET
Basophils Absolute: 0.1 10*3/uL (ref 0.0–0.2)
Basos: 1 %
EOS (ABSOLUTE): 0.2 10*3/uL (ref 0.0–0.4)
Eos: 2 %
Hematocrit: 37.2 % (ref 34.0–46.6)
Hemoglobin: 13.1 g/dL (ref 11.1–15.9)
Immature Grans (Abs): 0 10*3/uL (ref 0.0–0.1)
Immature Granulocytes: 0 %
Lymphocytes Absolute: 4.2 10*3/uL — ABNORMAL HIGH (ref 0.7–3.1)
Lymphs: 45 %
MCH: 31.5 pg (ref 26.6–33.0)
MCHC: 35.2 g/dL (ref 31.5–35.7)
MCV: 89 fL (ref 79–97)
Monocytes Absolute: 0.7 10*3/uL (ref 0.1–0.9)
Monocytes: 7 %
Neutrophils Absolute: 4.3 10*3/uL (ref 1.4–7.0)
Neutrophils: 45 %
Platelets: 240 10*3/uL (ref 150–450)
RBC: 4.16 x10E6/uL (ref 3.77–5.28)
RDW: 13.3 % (ref 11.7–15.4)
WBC: 9.5 10*3/uL (ref 3.4–10.8)

## 2021-02-08 LAB — PTH, INTACT AND CALCIUM: PTH: 39 pg/mL (ref 15–65)

## 2021-02-08 LAB — T4, FREE: Free T4: 1.35 ng/dL (ref 0.82–1.77)

## 2021-02-08 LAB — THYROID PEROXIDASE ANTIBODY: Thyroperoxidase Ab SerPl-aCnc: 221 IU/mL — ABNORMAL HIGH (ref 0–34)

## 2021-02-08 LAB — TSH: TSH: 4.31 u[IU]/mL (ref 0.450–4.500)

## 2021-02-08 NOTE — Progress Notes (Signed)
Contacted via [place] afternoon Etheleen, your labs have returned: - Thyroid levels show normal TSH and Free T4, can continue current Levothyroxine dosing.  Your thyroid antibody is elevated, showing this is probably more Hashimoto's as we discussed, we treat the same, but definitely read up on diet with Hashimoto's too. - Kidney function continues to show disease, but this remains stable and parathyroid is normal as is CBC, no anemia.  Any questions? Keep being awesome!!  Thank you for allowing me to participate in your care.  I appreciate you. Kindest regards, Romonia Yanik

## 2021-03-12 ENCOUNTER — Other Ambulatory Visit: Payer: Self-pay | Admitting: Nurse Practitioner

## 2021-03-12 MED ORDER — LEVOTHYROXINE SODIUM 112 MCG PO TABS: 112.0000 ug | ORAL_TABLET | Freq: Every day | ORAL | 3 refills | Status: DC

## 2021-03-12 NOTE — Telephone Encounter (Signed)
Requested Prescriptions  Pending Prescriptions Disp Refills  . levothyroxine (SYNTHROID) 112 MCG tablet 90 tablet 3    Sig: Take 1 tablet (112 mcg total) by mouth daily.     Endocrinology:  Hypothyroid Agents Failed - 03/12/2021  4:49 PM      Failed - TSH needs to be rechecked within 3 months after an abnormal result. Refill until TSH is due.      Passed - TSH in normal range and within 360 days    TSH  Date Value Ref Range Status  02/07/2021 4.310 0.450 - 4.500 uIU/mL Final         Passed - Valid encounter within last 12 months    Recent Outpatient Visits          1 month ago Stage 3a chronic kidney disease ([place])   [place], Jolene T, NP   5 months ago Hypothyroidism, unspecified type   [facility] Jon Billings, NP   5 months ago Acute non-recurrent maxillary sinusitis   [place], NP   8 months ago Stage 3a chronic kidney disease ([place])   [place], Barbaraann Faster, NP   1 year ago Menopausal vaginal dryness   [place], Barbaraann Faster, NP      Future Appointments            In 4 months Cannady, Barbaraann Faster, NP [facility]

## 2021-03-12 NOTE — Telephone Encounter (Signed)
Medication Refill - Medication: Levothyroxine  Has the patient contacted their pharmacy? No. Pt states that she had a hard time with the pharmacy last time and getting her meds refilled. Please advise.  (Agent: If no, request that the patient contact the pharmacy for the refill.) (Agent: If yes, when and what did the pharmacy advise?)  Preferred Pharmacy (with phone number or street name):  [facility], [place]  [place] [address]  Phone: [phone] Fax: [phone]  Hours: Not open 24 hours   Has the patient been seen for an appointment in the last year OR does the patient have an upcoming appointment? Yes.    Agent: Please be advised that RX refills may take up to 3 business days. We ask that you follow-up with your pharmacy.

## 2021-04-12 ENCOUNTER — Encounter: Payer: Self-pay | Admitting: Nurse Practitioner

## 2021-04-12 ENCOUNTER — Telehealth: Payer: Self-pay | Admitting: Nurse Practitioner

## 2021-04-12 ENCOUNTER — Ambulatory Visit: Payer: BC Managed Care – PPO | Admitting: Nurse Practitioner

## 2021-04-12 ENCOUNTER — Other Ambulatory Visit: Payer: Self-pay

## 2021-04-12 VITALS — BP 136/84 | HR 92 | Wt 146.8 lb

## 2021-04-12 DIAGNOSIS — F32A Depression, unspecified: Secondary | ICD-10-CM | POA: Diagnosis not present

## 2021-04-12 DIAGNOSIS — E063 Autoimmune thyroiditis: Secondary | ICD-10-CM | POA: Diagnosis not present

## 2021-04-12 DIAGNOSIS — F419 Anxiety disorder, unspecified: Secondary | ICD-10-CM | POA: Diagnosis not present

## 2021-04-12 MED ORDER — BUSPIRONE HCL 10 MG PO TABS: 10.0000 mg | ORAL_TABLET | Freq: Two times a day (BID) | ORAL | 4 refills | Status: DC

## 2021-04-12 MED ORDER — SERTRALINE HCL 100 MG PO TABS: 100.0000 mg | ORAL_TABLET | Freq: Every day | ORAL | 3 refills | Status: DC

## 2021-04-12 MED ORDER — ALPRAZOLAM 0.25 MG PO TABS: 0.2500 mg | ORAL_TABLET | Freq: Two times a day (BID) | ORAL | 0 refills | Status: DC | PRN

## 2021-04-12 NOTE — Telephone Encounter (Signed)
Copied from [place] [phone]. Topic: Appointment Scheduling - Scheduling Inquiry for Clinic >> Apr 11, 2021  5:03 PM Erick Blinks wrote: Reason for CRM: Pt needs urgent appt, work related. She declined the next available because she is off for thanksgiving but says she cannot wait until December. Says PCP knows her situation, says her job will try to fire her if she is not seen after thanksgiving essentially.   Best contact: [phone]

## 2021-04-12 NOTE — Assessment & Plan Note (Signed)
Chronic, ongoing.  Continue current Levothyroxine dose and recheck TSH and Free T4 today due to increased anxiety.  Adjust dose as needed based on results.

## 2021-04-12 NOTE — Progress Notes (Addendum)
BP 136/84 (BP Location: Left Arm)   Pulse 92   Wt 146 lb 12.8 oz (66.6 kg)   LMP  (LMP Unknown)   BMI 24.61 kg/m    Subjective:    Patient ID: Laura Soto, female    DOB: February 11, 1961, 60 y.o.   MRN: [id number]  HPI: ARCELIA PALS is a 60 y.o. 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 Patient states she does not know how much longer she can take due to her having other personal at home issues going on such as taking care of her mother who had a stroke about a year ago. 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  Having increased work related stress due to issues with managers and co workers.  Does not know how much longer she can take it.  Has been getting written up at work.  Feeling very overwhelmed.  Her boss is saying she can not do anything right.  Difficulty focusing at work and remembering due to being overwhelmed and anxious in her current workplace -- she feels she will get yelled at daily.  Is being "put down and treated like a nobody".  They are threatening to terminate her.  There are locations she could go to that are more comfortable for her [facility]), but they are not sending her to those locations.  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   Duration:uncontrolled Anxious mood: yes  Excessive worrying: yes Irritability: yes  Sweating: no Nausea: no Palpitations: occasional Hyperventilation: yes Panic attacks: yes Agoraphobia: no  Obscessions/compulsions: no Depressed mood:  yes Depression screen [facility] 2/9 04/12/2021 02/07/2021 10/12/2020 06/30/2020 07/13/2018  Decreased Interest 3 0 0 0 0  Down, Depressed, Hopeless 3 0 0 0 0  PHQ - 2 Score 6 0 0 0 0  Altered sleeping 3 0 - - 0  Tired, decreased energy 3 0 - - 0  Change in appetite 2 0 - - 0  Feeling bad or failure about yourself  2 0 - - 0  Trouble concentrating 3 0 - - 0  Moving slowly or fidgety/restless 3 0 - - 0  Suicidal thoughts 0 0 - - 0  PHQ-9 Score 22 0 - - 0  Difficult doing work/chores Very difficult Not difficult at all - - Not difficult at all   Anhedonia: no Weight changes: no Insomnia: yes hard to fall asleep  Hypersomnia: no Fatigue/loss of energy: yes Feelings of worthlessness: no Feelings of guilt: no Impaired concentration/indecisiveness: yes Suicidal ideations: no  Crying spells: yes Recent Stressors/Life Changes: yes   Relationship problems: no   Family stress: no     Financial stress: no    Job stress: yes    Recent death/loss: no  GAD 7 : Generalized Anxiety Score 04/12/2021 02/07/2021 10/12/2020 07/13/2018  Nervous, Anxious, on Edge 3 0 3 1  Control/stop worrying 3 0 3 0  Worry too much -  different things 3 0 3 1  Trouble relaxing 3 0 2 0  Restless 3 0 2 0  Easily annoyed or irritable 3 0 3 0  Afraid - awful might happen 2 0 0 0  Total GAD 7 Score 20 0 16 2  Anxiety Difficulty Extremely difficult Not difficult at all Very difficult Not difficult at all      Relevant past medical, surgical, family and social history reviewed and updated as indicated. Interim medical history since our last visit reviewed. Allergies and medications reviewed and updated.  Review of Systems  Constitutional:  Negative for activity change, appetite change, diaphoresis, fatigue and fever.  Respiratory: Negative.    Cardiovascular: Negative.   Gastrointestinal: Negative.   Endocrine: Negative for cold intolerance and heat intolerance.  Neurological: Negative.   Psychiatric/Behavioral:  Positive  for decreased concentration and sleep disturbance. Negative for self-injury and suicidal ideas. The patient is nervous/anxious.    Per HPI unless specifically indicated above     Objective:    BP 136/84 (BP Location: Left Arm)   Pulse 92   Wt 146 lb 12.8 oz (66.6 kg)   LMP  (LMP Unknown)   BMI 24.61 kg/m   Wt Readings from Last 3 Encounters:  04/12/21 146 lb 12.8 oz (66.6 kg)  02/07/21 148 lb (67.1 kg)  06/30/20 144 lb 12.8 oz (65.7 kg)    Physical Exam Vitals and nursing note reviewed.  Constitutional:      General: She is awake. She is not in acute distress.    Appearance: She is well-developed and well-groomed. She is not ill-appearing.  HENT:     Head: Normocephalic.     Right Ear: Hearing normal.     Left Ear: Hearing normal.  Eyes:     General: Lids are normal.        Right eye: No discharge.        Left eye: No discharge.     Conjunctiva/sclera: Conjunctivae normal.     Pupils: Pupils are equal, round, and reactive to light.  Neck:     Thyroid: No thyromegaly.     Vascular: No carotid bruit.  Cardiovascular:     Rate and Rhythm: Normal rate and regular rhythm.     Heart sounds: Normal heart sounds. No murmur heard.   No gallop.  Pulmonary:     Effort: Pulmonary effort is normal. No accessory muscle usage or respiratory distress.     Breath sounds: Normal breath sounds.  Abdominal:     General: Bowel sounds are normal.     Palpations: Abdomen is soft.  Musculoskeletal:     Cervical back: Normal range of motion and neck supple.     Right lower leg: No edema.     Left lower leg: No edema.  Skin:    General: Skin is warm and dry.  Neurological:     Mental Status: She is alert and oriented to person, place, and time.     Deep Tendon Reflexes: Reflexes are normal and symmetric.     Reflex Scores:      Brachioradialis reflexes are 2+ on the right side and 2+ on the left side.      Patellar reflexes are 2+ on the right side and 2+ on the left  side. Psychiatric:        Attention and Perception: Attention normal.        Mood and Affect: Mood is anxious. Affect is tearful.        Speech:  Speech is rapid and pressured.        Behavior: Behavior normal. Behavior is cooperative.        Thought Content: Thought content normal.    Results for orders placed or performed in visit on 02/07/21  TSH  Result Value Ref Range   TSH 4.310 0.450 - 4.500 uIU/mL  Thyroid peroxidase antibody  Result Value Ref Range   Thyroperoxidase Ab SerPl-aCnc 221 (H) 0 - 34 IU/mL  T4, free  Result Value Ref Range   Free T4 1.35 0.82 - 1.77 ng/dL  Comprehensive metabolic panel  Result Value Ref Range   Glucose 83 65 - 99 mg/dL   BUN 24 8 - 27 mg/dL   Creatinine, Ser 1.42 (H) 0.57 - 1.00 mg/dL   eGFR 42 (L) >59 mL/min/1.73   BUN/Creatinine Ratio 17 12 - 28   Sodium 137 134 - 144 mmol/L   Potassium 4.5 3.5 - 5.2 mmol/L   Chloride 102 96 - 106 mmol/L   CO2 19 (L) 20 - 29 mmol/L   Calcium 9.7 8.7 - 10.3 mg/dL   Total Protein 7.4 6.0 - 8.5 g/dL   Albumin 4.8 3.8 - 4.8 g/dL   Globulin, Total 2.6 1.5 - 4.5 g/dL   Albumin/Globulin Ratio 1.8 1.2 - 2.2   Bilirubin Total <0.2 0.0 - 1.2 mg/dL   Alkaline Phosphatase 98 44 - 121 IU/L   AST 18 0 - 40 IU/L   ALT 12 0 - 32 IU/L  PTH, Intact and Calcium  Result Value Ref Range   PTH 39 15 - 65 pg/mL   PTH Interp Comment   CBC with Differential/Platelet  Result Value Ref Range   WBC 9.5 3.4 - 10.8 x10E3/uL   RBC 4.16 3.77 - 5.28 x10E6/uL   Hemoglobin 13.1 11.1 - 15.9 g/dL   Hematocrit 37.2 34.0 - 46.6 %   MCV 89 79 - 97 fL   MCH 31.5 26.6 - 33.0 pg   MCHC 35.2 31.5 - 35.7 g/dL   RDW 13.3 11.7 - 15.4 %   Platelets 240 150 - 450 x10E3/uL   Neutrophils 45 Not Estab. %   Lymphs 45 Not Estab. %   Monocytes 7 Not Estab. %   Eos 2 Not Estab. %   Basos 1 Not Estab. %   Neutrophils Absolute 4.3 1.4 - 7.0 x10E3/uL   Lymphocytes Absolute 4.2 (H) 0.7 - 3.1 x10E3/uL   Monocytes Absolute 0.7 0.1 - 0.9 x10E3/uL    EOS (ABSOLUTE) 0.2 0.0 - 0.4 x10E3/uL   Basophils Absolute 0.1 0.0 - 0.2 x10E3/uL   Immature Granulocytes 0 Not Estab. %   Immature Grans (Abs) 0.0 0.0 - 0.1 x10E3/uL      Assessment & Plan:   Problem List Items Addressed This Visit       Endocrine   Hashimoto's thyroiditis    Chronic, ongoing.  Continue current Levothyroxine dose and recheck TSH and Free T4 today due to increased anxiety.  Adjust dose as needed based on results.        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  Will do a direct switch from Celexa 40 MG to Sertraline 100 MG daily and recommend she take Buspar 10 MG twice a day.  Will add on a short burst of 0.25 MG Xanax she can take twice  a day as needed only for SEVERE anxiety episodes, discussed with her today at length and she understands to use sparsely and that it is not long term.  Referral to therapy placed, which would be beneficial due to her childhood and current stressors.  Return in 2 weeks.      Relevant Medications   sertraline (ZOLOFT) 100 MG tablet   busPIRone (BUSPAR) 10 MG tablet   ALPRAZolam (XANAX) 0.25 MG tablet   Other Relevant Orders   Ambulatory referral to Psychology    Time: 25 minutes, >50% spent counseling/or care coordination + therapeutic listening   Follow up plan: Return in about 2 weeks (around 04/26/2021) for ANXIETY.

## 2021-04-12 NOTE — Telephone Encounter (Signed)
Lmom for patient to call back to make appointment.

## 2021-04-12 NOTE — Telephone Encounter (Signed)
Appointment scheduled.

## 2021-04-12 NOTE — Patient Instructions (Addendum)
STOP CELEXA AND START SERTRALINE 100 MG DAILY BY MOUTH + BUSPAR 10 MG BID -- I AM ALSO PROVIDING A SHORT BURST OF XANAX TO TAKE AS NEEDED ONLY IF YOU ARE OVERWHELMED.  Managing Anxiety, Adult After being diagnosed with anxiety, you may be relieved to know why you have felt or behaved a certain way. You may also feel overwhelmed about the treatment ahead and what it will mean for your life. With care and support, you can manage this condition. How to manage lifestyle changes Managing stress and anxiety Stress is your body's reaction to life changes and events, both good and bad. Most stress will last just a few hours, but stress can be ongoing and can lead to more than just stress. Although stress can play a major role in anxiety, it is not the same as anxiety. Stress is usually caused by something external, such as a deadline, test, or competition. Stress normally passes after the triggering event has ended.  Anxiety is caused by something internal, such as imagining a terrible outcome or worrying that something will go wrong that will devastate you. Anxiety often does not go away even after the triggering event is over, and it can become long-term (chronic) worry. It is important to understand the differences between stress and anxiety and to manage your stress effectively so that it does not lead to an anxious response. Talk with your health care provider or a counselor to learn more about reducing anxiety and stress. He or she may suggest tension reduction techniques, such as: Music therapy. Spend time creating or listening to music that you enjoy and that inspires you. Mindfulness-based meditation. Practice being aware of your normal breaths while not trying to control your breathing. It can be done while sitting or walking. Centering prayer. This involves focusing on a word, phrase, or sacred image that means something to you and brings you peace. Deep breathing. To do this, expand your stomach and  inhale slowly through your nose. Hold your breath for 3-5 seconds. Then exhale slowly, letting your stomach muscles relax. Self-talk. Learn to notice and identify thought patterns that lead to anxiety reactions and change those patterns to thoughts that feel peaceful. Muscle relaxation. Taking time to tense muscles and then relax them. Choose a tension reduction technique that fits your lifestyle and personality. These techniques take time and practice. Set aside 5-15 minutes a day to do them. Therapists can offer counseling and training in these techniques. The training to help with anxiety may be covered by some insurance plans. Other things you can do to manage stress and anxiety include: Keeping a stress diary. This can help you learn what triggers your reaction and then learn ways to manage your response. Thinking about how you react to certain situations. You may not be able to control everything, but you can control your response. Making time for activities that help you relax and not feeling guilty about spending your time in this way. Doing visual imagery. This involves imagining or creating mental pictures to help you relax. Practicing yoga. Through yoga poses, you can lower tension and promote relaxation.  Medicines Medicines can help ease symptoms. Medicines for anxiety include: Antidepressant medicines. These are usually prescribed for long-term daily control. Anti-anxiety medicines. These may be added in severe cases, especially when panic attacks occur. Medicines will be prescribed by a health care provider. When used together, medicines, psychotherapy, and tension reduction techniques may be the most effective treatment. Relationships Relationships can play a big  part in helping you recover. Try to spend more time connecting with trusted friends and family members. Consider going to couples counseling if you have a partner, taking family education classes, or going to family  therapy. Therapy can help you and others better understand your condition. How to recognize changes in your anxiety Everyone responds differently to treatment for anxiety. Recovery from anxiety happens when symptoms decrease and stop interfering with your daily activities at home or work. This may mean that you will start to: Have better concentration and focus. Worry will interfere less in your daily thinking. Sleep better. Be less irritable. Have more energy. Have improved memory. It is also important to recognize when your condition is getting worse. Contact your health care provider if your symptoms interfere with home or work and you feel like your condition is not improving. Follow these instructions at home: Activity Exercise. Adults should do the following: Exercise for at least 150 minutes each week. The exercise should increase your heart rate and make you sweat (moderate-intensity exercise). Strengthening exercises at least twice a week. Get the right amount and quality of sleep. Most adults need 7-9 hours of sleep each night. Lifestyle  Eat a healthy diet that includes plenty of vegetables, fruits, whole grains, low-fat dairy products, and lean protein. Do not eat a lot of foods that are high in fats, added sugars, or salt (sodium). Make choices that simplify your life. Do not use any products that contain nicotine or tobacco. These products include cigarettes, chewing tobacco, and vaping devices, such as e-cigarettes. If you need help quitting, ask your health care provider. Avoid caffeine, alcohol, and certain over-the-counter cold medicines. These may make you feel worse. Ask your pharmacist which medicines to avoid. General instructions Take over-the-counter and prescription medicines only as told by your health care provider. Keep all follow-up visits. This is important. Where to find support You can get help and support from these sources: Self-help groups. Online and  [employer]. A trusted spiritual leader. Couples counseling. Family education classes. Family therapy. Where to find more information You may find that joining a support group helps you deal with your anxiety. The following sources can help you locate counselors or support groups near you: [place]: www.mentalhealthamerica.net Anxiety and Depression Association of [place] (ADAA): https://www.clark.net/ National Alliance on Mental Illness (NAMI): www.nami.org Contact a health care provider if: You have a hard time staying focused or finishing daily tasks. You spend many hours a day feeling worried about everyday life. You become exhausted by worry. You start to have headaches or frequently feel tense. You develop chronic nausea or diarrhea. Get help right away if: You have a racing heart and shortness of breath. You have thoughts of hurting yourself or others. If you ever feel like you may hurt yourself or others, or have thoughts about taking your own life, get help right away. Go to your nearest emergency department or: Call your local emergency services (911 in the [place] Call a suicide crisis helpline, such as the [place] at (813)268-2219 or 988 in the [place]. This is open 24 hours a day in the [place] Text the Crisis Text Line at 205-248-7101 (in the [place].). Summary Taking steps to learn and use tension reduction techniques can help calm you and help prevent triggering an anxiety reaction. When used together, medicines, psychotherapy, and tension reduction techniques may be the most effective treatment. Family, friends, and partners can play a big part in supporting you. This information  is not intended to replace advice given to you by your health care provider. Make sure you discuss any questions you have with your health care provider. Document Revised: 12/06/2020 Document Reviewed: 09/03/2020 Elsevier Patient Education  [employer].

## 2021-04-12 NOTE — Assessment & Plan Note (Signed)
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  Will do a direct switch from Celexa 40 MG to Sertraline 100 MG daily and recommend she take Buspar 10 MG twice a day.  Will add on a short burst of 0.25 MG Xanax she can take twice a day as needed only for SEVERE anxiety episodes, discussed with her today at length and she understands to use sparsely and that it is not long term.  Referral to therapy placed, which would be beneficial due to her childhood and current stressors.  Return in 2 weeks.

## 2021-04-13 LAB — T4, FREE: Free T4: 1.32 ng/dL (ref 0.82–1.77)

## 2021-04-13 LAB — TSH: TSH: 6.91 u[IU]/mL — ABNORMAL HIGH (ref 0.450–4.500)

## 2021-04-13 NOTE — Progress Notes (Signed)
Contacted via MyChart   Good afternoon Laura Soto your thyroid labs show mild elevation in TSH and normal Free T4, since we have pushed you too low before with adjustments and you are currently anxious I would recommend we maintain your current dosing and then recheck at your 04/27/21 visit, if still elevated then we may adjust dosing.  Any questions? Keep being stellar!!  Thank you for allowing me to participate in your care.  I appreciate you. Kindest regards, Kerah Hardebeck

## 2021-04-17 ENCOUNTER — Telehealth: Payer: Self-pay | Admitting: Nurse Practitioner

## 2021-04-17 NOTE — Telephone Encounter (Signed)
Copied from [place] [id number]. Topic: Referral - Question >> Apr 17, 2021  9:09 AM Greggory Keen D wrote: Reason for CRM: Pt says she has ana ppt for a therapist in [place] but she would rather see someone in [place] unless [place] wants her to see this particular provider.  CB#  [phone]

## 2021-04-22 IMAGING — MG DIGITAL SCREENING BILAT W/ TOMO W/ CAD
8 series · 8 of 24 positions shown · non-contrast
Comparison: Previous exam(s).

CLINICAL DATA: Screening.

EXAM:
DIGITAL SCREENING BILATERAL MAMMOGRAM WITH TOMO AND CAD

[L CC synth-2D]
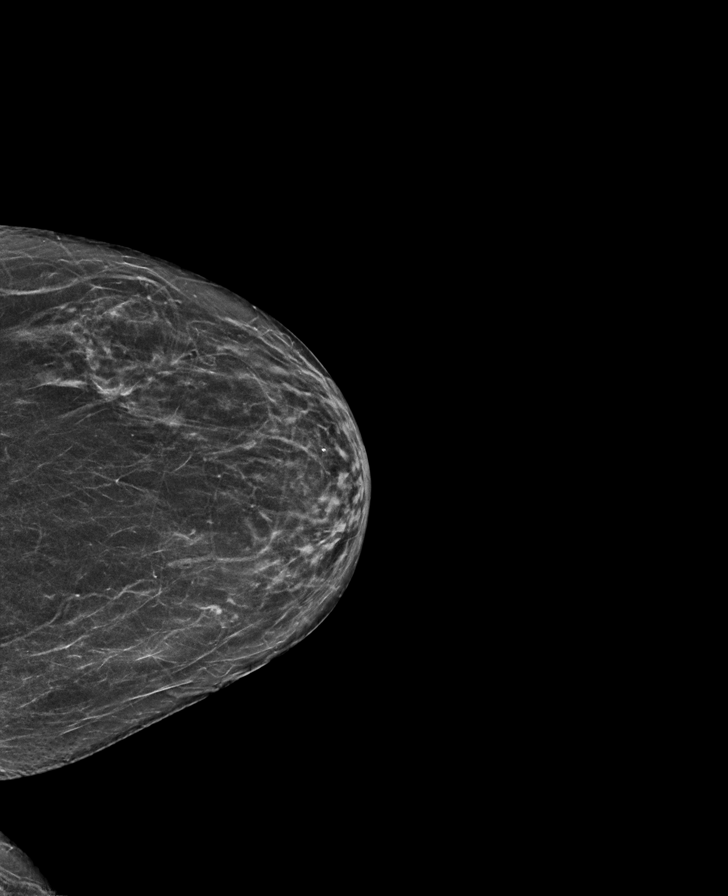

[R MLO synth-2D]
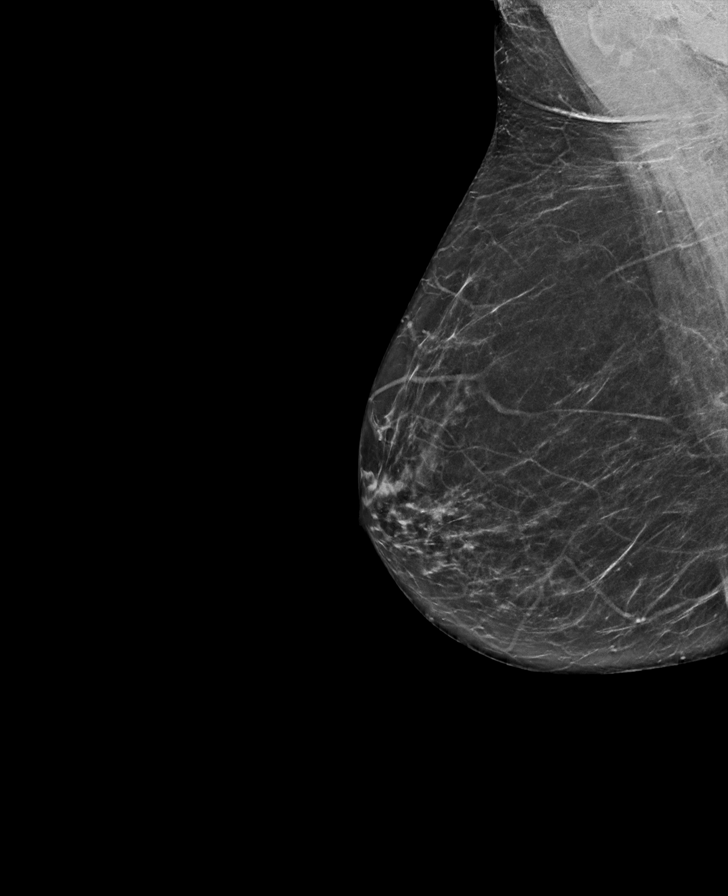

[R CC synth-2D]
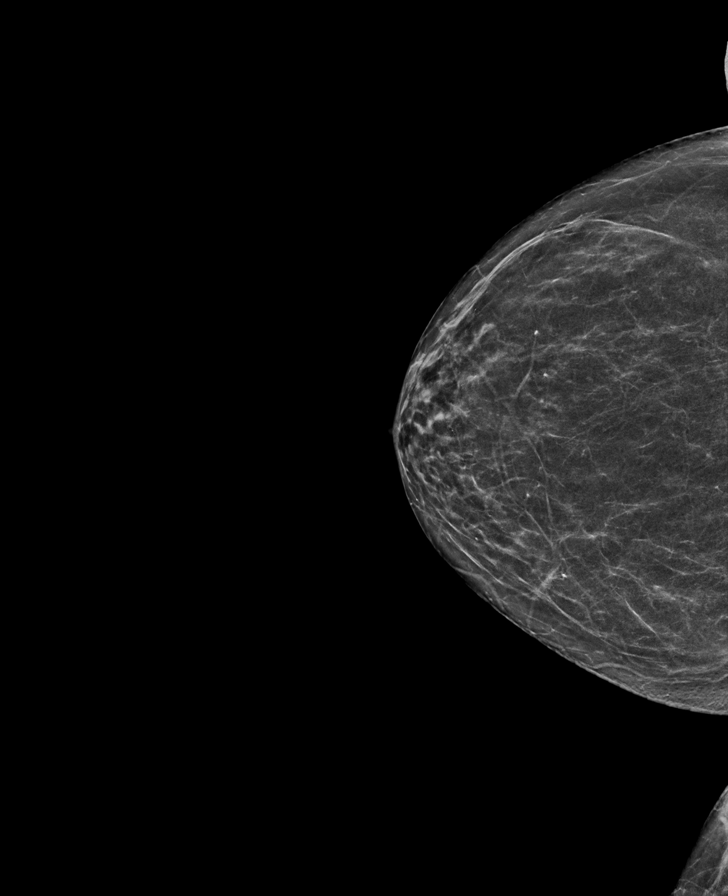

[L MLO synth-2D]
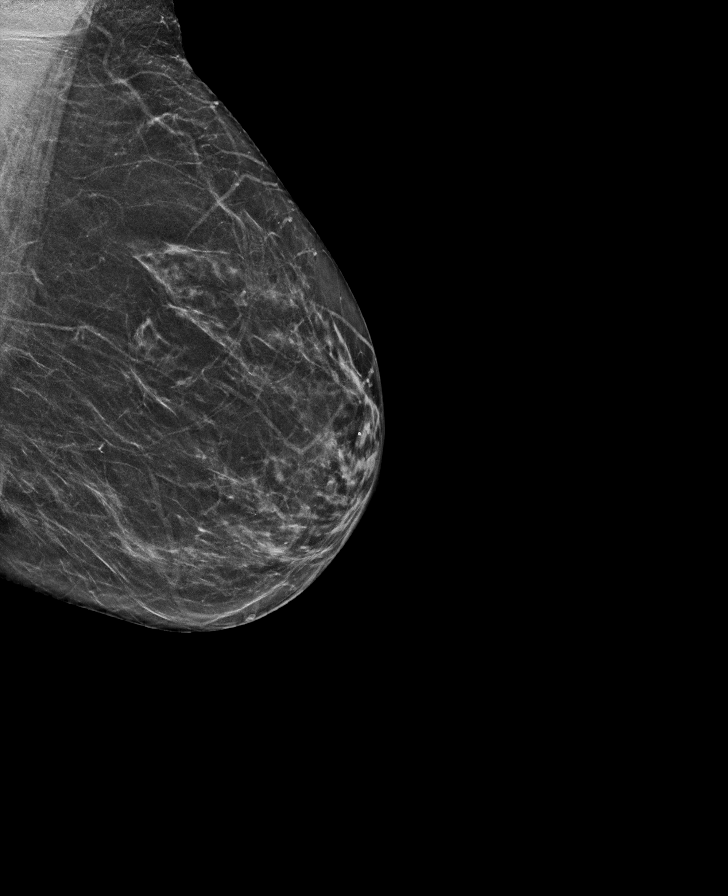

[R CC tomo · tomo slice 31/60.0]
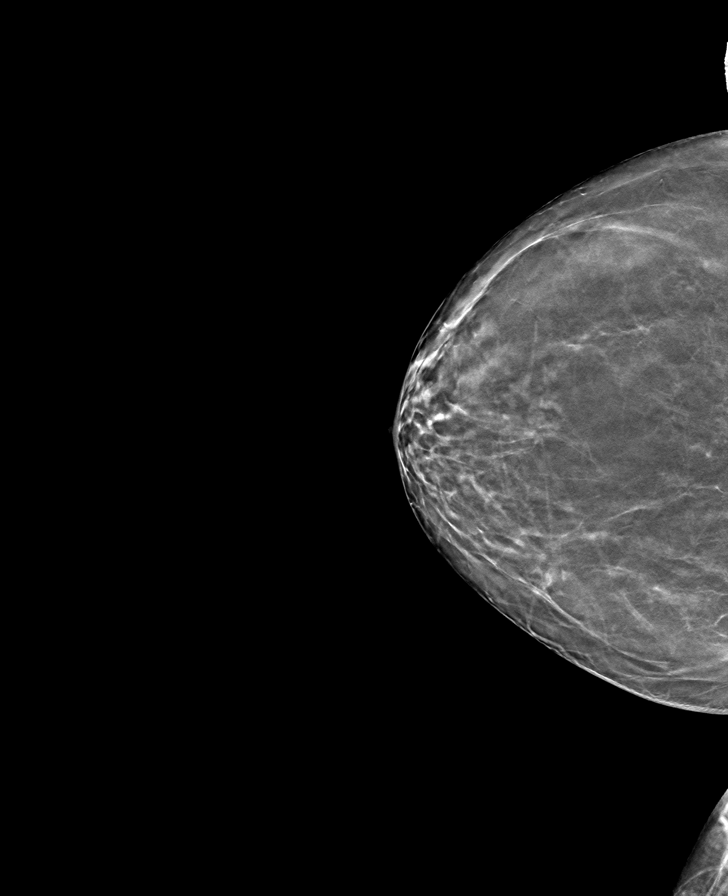

[L CC tomo · tomo slice 30/59.0]
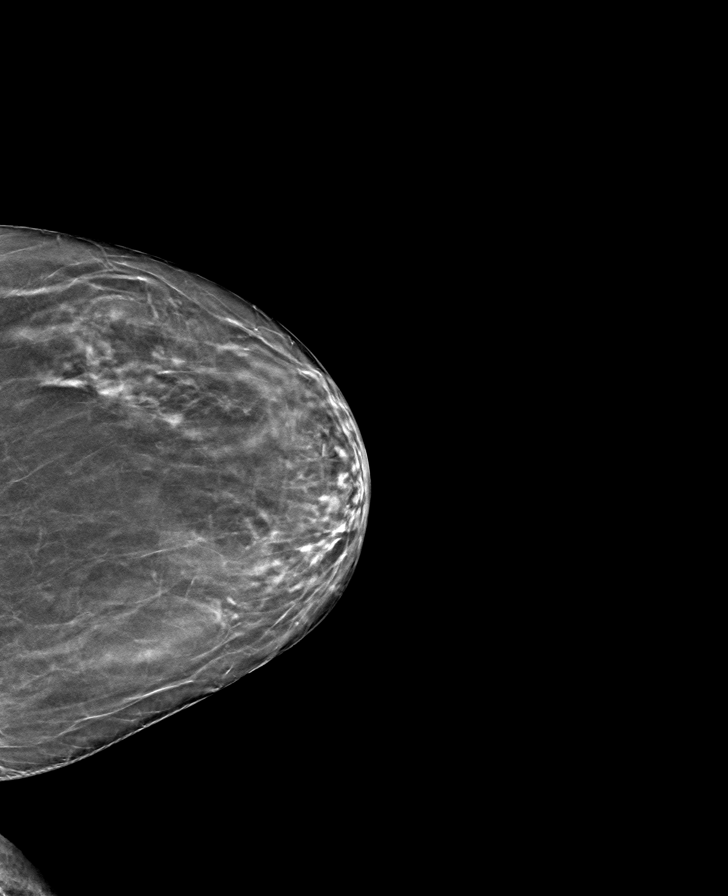

[L MLO tomo · tomo slice 31/62.0]
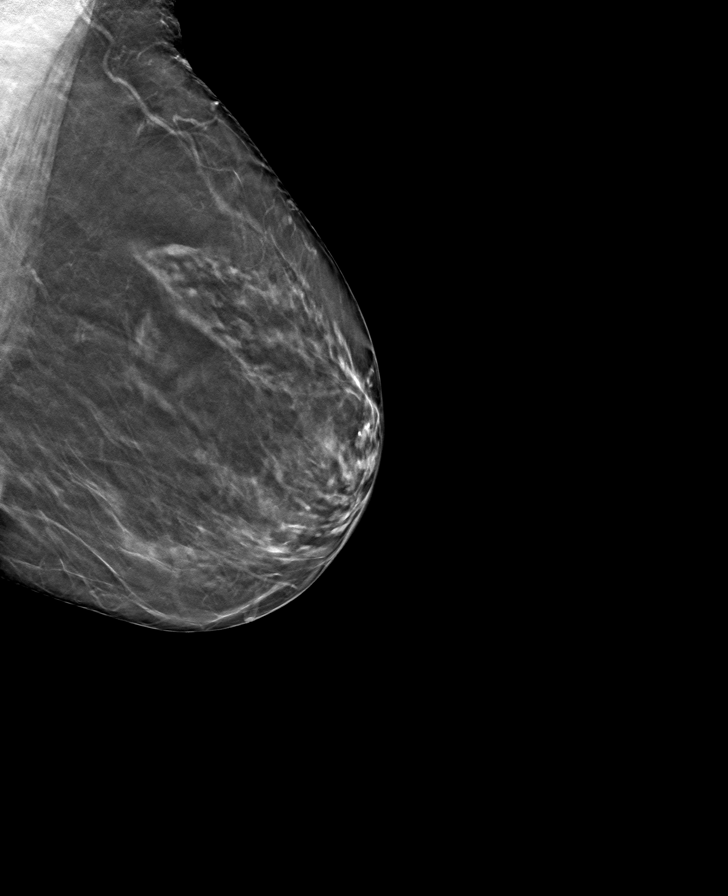

[R MLO tomo · tomo slice 33/64.0]
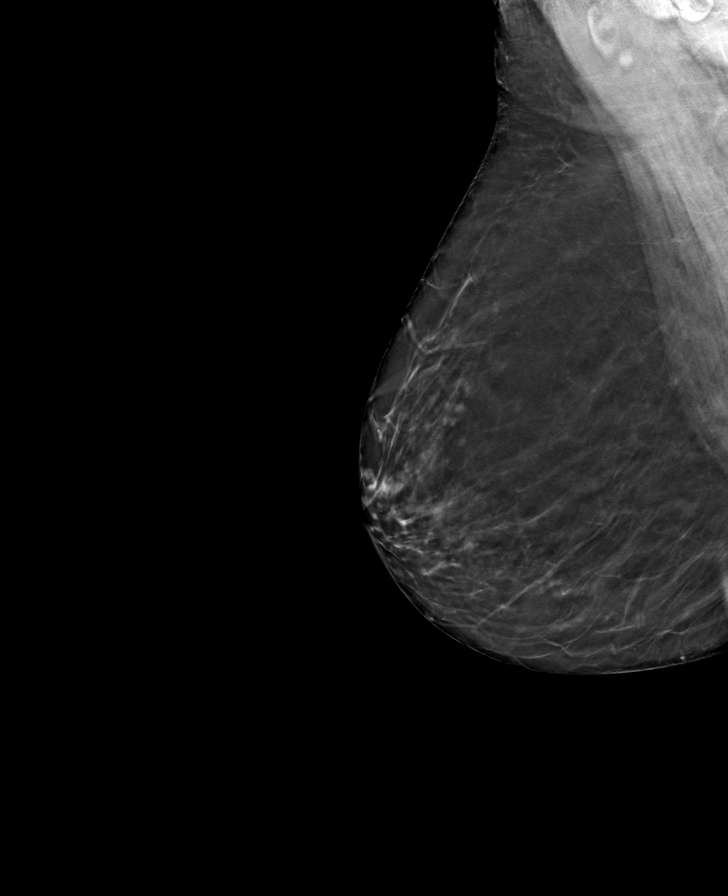

[8 of 24 positions shown; findings below may reference images not displayed]

ACR Breast Density Category b: There are scattered areas of
fibroglandular density.
FINDINGS: There are no findings suspicious for malignancy. Images were
processed with CAD.
IMPRESSION: No mammographic evidence of malignancy. A result letter of this
screening mammogram will be mailed directly to the patient.

RECOMMENDATION:
Screening mammogram in one year. (Code:[id number])

BI-RADS CATEGORY  1: Negative.

## 2021-04-23 ENCOUNTER — Encounter: Payer: Self-pay | Admitting: Nurse Practitioner

## 2021-04-23 NOTE — Telephone Encounter (Signed)
Copied from [place] [id number]. Topic: General - Other >> Apr 23, 2021  3:45 PM Yvette Rack wrote: Reason for CRM: Pt stated she has sent messages for Jolene to return her call but she has not received a call back. Pt requests that Jolene return call.

## 2021-04-27 ENCOUNTER — Ambulatory Visit: Payer: BC Managed Care – PPO | Admitting: Nurse Practitioner

## 2021-04-27 ENCOUNTER — Other Ambulatory Visit: Payer: Self-pay

## 2021-04-27 ENCOUNTER — Encounter: Payer: Self-pay | Admitting: Nurse Practitioner

## 2021-04-27 VITALS — BP 120/74 | HR 76 | Temp 98.9°F | Wt 146.0 lb

## 2021-04-27 DIAGNOSIS — F32A Depression, unspecified: Secondary | ICD-10-CM | POA: Diagnosis not present

## 2021-04-27 DIAGNOSIS — E063 Autoimmune thyroiditis: Secondary | ICD-10-CM | POA: Diagnosis not present

## 2021-04-27 DIAGNOSIS — F419 Anxiety disorder, unspecified: Secondary | ICD-10-CM | POA: Diagnosis not present

## 2021-04-27 NOTE — Patient Instructions (Signed)
Managing Anxiety, Adult ?After being diagnosed with anxiety, you may be relieved to know why you have felt or behaved a certain way. You may also feel overwhelmed about the treatment ahead and what it will mean for your life. With care and support, you can manage this condition. ?How to manage lifestyle changes ?Managing stress and anxiety ?Stress is your body's reaction to life changes and events, both good and bad. Most stress will last just a few hours, but stress can be ongoing and can lead to more than just stress. Although stress can play a major role in anxiety, it is not the same as anxiety. Stress is usually caused by something external, such as a deadline, test, or competition. Stress normally passes after the triggering event has ended.  ?Anxiety is caused by something internal, such as imagining a terrible outcome or worrying that something will go wrong that will devastate you. Anxiety often does not go away even after the triggering event is over, and it can become long-term (chronic) worry. It is important to understand the differences between stress and anxiety and to manage your stress effectively so that it does not lead to an anxious response. ?Talk with your health care provider or a counselor to learn more about reducing anxiety and stress. He or she may suggest tension reduction techniques, such as: ?Music therapy. Spend time creating or listening to music that you enjoy and that inspires you. ?Mindfulness-based meditation. Practice being aware of your normal breaths while not trying to control your breathing. It can be done while sitting or walking. ?Centering prayer. This involves focusing on a word, phrase, or sacred image that means something to you and brings you peace. ?Deep breathing. To do this, expand your stomach and inhale slowly through your nose. Hold your breath for 3-5 seconds. Then exhale slowly, letting your stomach muscles relax. ?Self-talk. Learn to notice and identify  thought patterns that lead to anxiety reactions and change those patterns to thoughts that feel peaceful. ?Muscle relaxation. Taking time to tense muscles and then relax them. ?Choose a tension reduction technique that fits your lifestyle and personality. These techniques take time and practice. Set aside 5-15 minutes a day to do them. Therapists can offer counseling and training in these techniques. The training to help with anxiety may be covered by some insurance plans. ?Other things you can do to manage stress and anxiety include: ?Keeping a stress diary. This can help you learn what triggers your reaction and then learn ways to manage your response. ?Thinking about how you react to certain situations. You may not be able to control everything, but you can control your response. ?Making time for activities that help you relax and not feeling guilty about spending your time in this way. ?Doing visual imagery. This involves imagining or creating mental pictures to help you relax. ?Practicing yoga. Through yoga poses, you can lower tension and promote relaxation. ? ?Medicines ?Medicines can help ease symptoms. Medicines for anxiety include: ?Antidepressant medicines. These are usually prescribed for long-term daily control. ?Anti-anxiety medicines. These may be added in severe cases, especially when panic attacks occur. ?Medicines will be prescribed by a health care provider. When used together, medicines, psychotherapy, and tension reduction techniques may be the most effective treatment. ?Relationships ?Relationships can play a big part in helping you recover. Try to spend more time connecting with trusted friends and family members. ?Consider going to couples counseling if you have a partner, taking family education classes, or going to family   therapy. ?Therapy can help you and others better understand your condition. ?How to recognize changes in your anxiety ?Everyone responds differently to treatment for  anxiety. Recovery from anxiety happens when symptoms decrease and stop interfering with your daily activities at home or work. This may mean that you will start to: ?Have better concentration and focus. Worry will interfere less in your daily thinking. ?Sleep better. ?Be less irritable. ?Have more energy. ?Have improved memory. ?It is also important to recognize when your condition is getting worse. Contact your health care provider if your symptoms interfere with home or work and you feel like your condition is not improving. ?Follow these instructions at home: ?Activity ?Exercise. Adults should do the following: ?Exercise for at least 150 minutes each week. The exercise should increase your heart rate and make you sweat (moderate-intensity exercise). ?Strengthening exercises at least twice a week. ?Get the right amount and quality of sleep. Most adults need 7-9 hours of sleep each night. ?Lifestyle ? ?Eat a healthy diet that includes plenty of vegetables, fruits, whole grains, low-fat dairy products, and lean protein. ?Do not eat a lot of foods that are high in fats, added sugars, or salt (sodium). ?Make choices that simplify your life. ?Do not use any products that contain nicotine or tobacco. These products include cigarettes, chewing tobacco, and vaping devices, such as e-cigarettes. If you need help quitting, ask your health care provider. ?Avoid caffeine, alcohol, and certain over-the-counter cold medicines. These may make you feel worse. Ask your pharmacist which medicines to avoid. ?General instructions ?Take over-the-counter and prescription medicines only as told by your health care provider. ?Keep all follow-up visits. This is important. ?Where to find support ?You can get help and support from these sources: ?Self-help groups. ?Online and community organizations. ?A trusted spiritual leader. ?Couples counseling. ?Family education classes. ?Family therapy. ?Where to find more information ?You may find  that joining a support group helps you deal with your anxiety. The following sources can help you locate counselors or support groups near you: ?Mental Health America: www.mentalhealthamerica.net ?Anxiety and Depression Association of America (ADAA): www.adaa.org ?National Alliance on Mental Illness (NAMI): www.nami.org ?Contact a health care provider if: ?You have a hard time staying focused or finishing daily tasks. ?You spend many hours a day feeling worried about everyday life. ?You become exhausted by worry. ?You start to have headaches or frequently feel tense. ?You develop chronic nausea or diarrhea. ?Get help right away if: ?You have a racing heart and shortness of breath. ?You have thoughts of hurting yourself or others. ?If you ever feel like you may hurt yourself or others, or have thoughts about taking your own life, get help right away. Go to your nearest emergency department or: ?Call your local emergency services (911 in the [place] ?Call a suicide crisis helpline, such as the National Suicide Prevention Lifeline at 1-800-273-8255 or 988 in the [place] This is open 24 hours a day in the [place] ?Text the Crisis Text Line at 741741 (in the [place] ?Summary ?Taking steps to learn and use tension reduction techniques can help calm you and help prevent triggering an anxiety reaction. ?When used together, medicines, psychotherapy, and tension reduction techniques may be the most effective treatment. ?Family, friends, and partners can play a big part in supporting you. ?This information is not intended to replace advice given to you by your health care provider. Make sure you discuss any questions you have with your health care provider. ?Document Revised: 12/06/2020 Document Reviewed: 09/03/2020 ?Elsevier Patient   Education ? 2022 Elsevier Inc. ? ?

## 2021-04-27 NOTE — Progress Notes (Signed)
BP 120/74   Pulse 76   Temp 98.9 F (37.2 C) (Oral)   Wt 146 lb (66.2 kg)   LMP  (LMP Unknown)   SpO2 98%   BMI 24.47 kg/m    Subjective:    Patient ID: Laura Soto, female    DOB: [birth year], 60 y.o.   MRN: [id number]  HPI: Laura Soto is a 60 y.o. female  Chief Complaint  Patient presents with   Anxiety    Patient is here to follow up on Anxiety. Patient states she is much better from last visit. Patient states the medication she was prescribed and it has helped. Patient states things at her job are about the same and nothing has changed as far as her job.    ANXIETY/STRESS Follow-up for anxiety due to work stressors, last visit we changed her Celexa to Sertraline and continued Buspar but at increased dose 10 MG BID + added on PRN Xanax to used very sparsely and not for long term use.  She reports today feeling much better with Sertraline on board -- less anxious, but nothing has changed at her workplace.  Has not taken any Xanax.  Her recent thyroid labs were mildly elevated - TSH 6.910.  Has increased work related stress due to issues with managers and co workers.  Has been getting written up at work -- they have not sent these in but continue to threaten her with these.  Feeling very overwhelmed.  Her boss is saying she can not do anything right.  Difficulty focusing at work and remembering due to being overwhelmed and anxious in her current workplace -- she feels she will get yelled at daily. They are threatening to terminate her.  There are locations she could go to that are more comfortable for her [facility]), but they are not sending her to those locations.  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   Duration: stable Anxious mood: improved Excessive worrying: not as much Irritability: not as much Sweating: no Nausea: no Palpitations: none Hyperventilation: none Panic attacks: none Agoraphobia:  no  Obscessions/compulsions: no Depressed mood: none Depression screen [facility] 2/9 04/27/2021 04/12/2021 02/07/2021 10/12/2020 06/30/2020  Decreased Interest 1 3 0 0 0  Down, Depressed, Hopeless 1 3 0 0 0  PHQ - 2 Score 2 6 0 0 0  Altered sleeping 1 3 0 - -  Tired, decreased energy 1 3 0 - -  Change in appetite 1 2 0 - -  Feeling bad or failure about yourself  1 2 0 - -  Trouble concentrating 1 3 0 - -  Moving slowly or fidgety/restless 1 3 0 - -  Suicidal thoughts 1 0 0 - -  PHQ-9 Score 9 22 0 - -  Difficult doing work/chores Somewhat difficult Very difficult Not difficult at all - -  Anhedonia: no Weight changes: no Insomnia: none Hypersomnia: no Fatigue/loss of energy: yes Feelings of worthlessness: no Feelings of guilt: no Impaired concentration/indecisiveness: none Suicidal ideations: no  Crying spells: none Recent Stressors/Life Changes: yes   Relationship problems: no   Family stress: no     Financial stress: no    Job stress: yes    Recent death/loss: no  GAD 7 : Generalized Anxiety Score 04/27/2021 04/12/2021 02/07/2021 10/12/2020  Nervous, Anxious, on Edge 1 3 0 3  Control/stop worrying 1 3 0 3  Worry too much -  different things 1 3 0 3  Trouble relaxing 1 3 0 2  Restless 1 3 0 2  Easily annoyed or irritable 1 3 0 3  Afraid - awful might happen 1 2 0 0  Total GAD 7 Score 7 20 0 16  Anxiety Difficulty Somewhat difficult Extremely difficult Not difficult at all Very difficult    Relevant past medical, surgical, family and social history reviewed and updated as indicated. Interim medical history since our last visit reviewed. Allergies and medications reviewed and updated.  Review of Systems  Constitutional:  Negative for activity change, appetite change, diaphoresis, fatigue and fever.  Respiratory: Negative.    Cardiovascular: Negative.   Gastrointestinal: Negative.   Endocrine: Negative for cold intolerance and heat intolerance.  Neurological: Negative.    Psychiatric/Behavioral:  Positive for decreased concentration and sleep disturbance. Negative for self-injury and suicidal ideas. The patient is nervous/anxious.    Per HPI unless specifically indicated above     Objective:    BP 120/74   Pulse 76   Temp 98.9 F (37.2 C) (Oral)   Wt 146 lb (66.2 kg)   LMP  (LMP Unknown)   SpO2 98%   BMI 24.47 kg/m   Wt Readings from Last 3 Encounters:  04/27/21 146 lb (66.2 kg)  04/12/21 146 lb 12.8 oz (66.6 kg)  02/07/21 148 lb (67.1 kg)    Physical Exam Vitals and nursing note reviewed.  Constitutional:      General: She is awake. She is not in acute distress.    Appearance: She is well-developed and well-groomed. She is not ill-appearing.  HENT:     Head: Normocephalic.     Right Ear: Hearing normal.     Left Ear: Hearing normal.  Eyes:     General: Lids are normal.        Right eye: No discharge.        Left eye: No discharge.     Conjunctiva/sclera: Conjunctivae normal.     Pupils: Pupils are equal, round, and reactive to light.  Neck:     Thyroid: No thyromegaly.     Vascular: No carotid bruit.  Cardiovascular:     Rate and Rhythm: Normal rate and regular rhythm.     Heart sounds: Normal heart sounds. No murmur heard.   No gallop.  Pulmonary:     Effort: Pulmonary effort is normal. No accessory muscle usage or respiratory distress.     Breath sounds: Normal breath sounds.  Abdominal:     General: Bowel sounds are normal.     Palpations: Abdomen is soft.  Musculoskeletal:     Cervical back: Normal range of motion and neck supple.     Right lower leg: No edema.     Left lower leg: No edema.  Skin:    General: Skin is warm and dry.  Neurological:     Mental Status: She is alert and oriented to person, place, and time.     Deep Tendon Reflexes: Reflexes are normal and symmetric.     Reflex Scores:      Brachioradialis reflexes are 2+ on the right side and 2+ on the left side.      Patellar reflexes are 2+ on the right  side and 2+ on the left side. Psychiatric:        Attention and Perception: Attention normal.        Mood and Affect: Mood is anxious.        Speech: Speech normal.  Behavior: Behavior normal. Behavior is cooperative.        Thought Content: Thought content normal.    Results for orders placed or performed in visit on 04/12/21  T4, free  Result Value Ref Range   Free T4 1.32 0.82 - 1.77 ng/dL  TSH  Result Value Ref Range   TSH 6.910 (H) 0.450 - 4.500 uIU/mL      Assessment & Plan:   Problem List Items Addressed This Visit       Endocrine   Hashimoto's thyroiditis - Primary    Chronic, ongoing.  Continue current Levothyroxine dose and recheck TSH and Free T4 as recent slightly elevated.  Adjust dose as needed based on results.        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  Continue current medication regimen as is seeing improved mood with this.  Will continue short burst of 0.25 MG Xanax she can take twice a day as needed only for SEVERE anxiety episodes (she has not needed to take), discussed with her today at length and she understands to use sparsely and that it is not long term.  Denies SI/HI.        Follow up plan: Return for As scheduled in March.

## 2021-04-27 NOTE — Assessment & Plan Note (Signed)
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  Continue current medication regimen as is seeing improved mood with this.  Will continue short burst of 0.25 MG Xanax she can take twice a day as needed only for SEVERE anxiety episodes (she has not needed to take), discussed with her today at length and she understands to use sparsely and that it is not long term.  Denies SI/HI.

## 2021-04-27 NOTE — Assessment & Plan Note (Signed)
Chronic, ongoing.  Continue current Levothyroxine dose and recheck TSH and Free T4 as recent slightly elevated.  Adjust dose as needed based on results.

## 2021-04-28 ENCOUNTER — Other Ambulatory Visit: Payer: Self-pay | Admitting: Nurse Practitioner

## 2021-04-28 DIAGNOSIS — E063 Autoimmune thyroiditis: Secondary | ICD-10-CM

## 2021-04-28 LAB — TSH: TSH: 9.07 u[IU]/mL — ABNORMAL HIGH (ref 0.450–4.500)

## 2021-04-28 LAB — T4, FREE: Free T4: 1.09 ng/dL (ref 0.82–1.77)

## 2021-04-28 MED ORDER — LEVOTHYROXINE SODIUM 125 MCG PO TABS: 125.0000 ug | ORAL_TABLET | Freq: Every day | ORAL | 3 refills | Status: DC

## 2021-04-28 NOTE — Progress Notes (Signed)
Contacted via [place] -- 6 week lab only visit needed  Good morning Celest, I have viewed your labs and TSH actually trended up, Free T4 is in normal range.  I would like to increase Levothyroxine to 125 MCG as I discussed and obtain a thyroid ultrasound to assess thyroid further.  I have sent medication and ordered ultrasound.  Please ensure you are taking Levothyroxine every day, first thing in the morning 30 minutes before eating or taking other medications.  Any questions?  I would like to recheck labs via lab only visit in 6 weeks please, my staff will call and schedule. Keep being amazing!!  Thank you for allowing me to participate in your care.  I appreciate you. Kindest regards, Miyuki Rzasa

## 2021-04-28 NOTE — Addendum Note (Signed)
Addended by: Marnee Guarneri T on: 04/28/2021 08:53 AM   Modules accepted: Orders

## 2021-04-30 NOTE — Progress Notes (Signed)
Lab only appointment scheduled.

## 2021-05-08 ENCOUNTER — Ambulatory Visit: Payer: BC Managed Care – PPO

## 2021-05-15 ENCOUNTER — Telehealth: Payer: Self-pay | Admitting: Nurse Practitioner

## 2021-05-15 NOTE — Telephone Encounter (Signed)
Pt asks if provider could send in something for sinus/cold. Pt asks for a Zpak. Pt states that mucus is clear but a zpak will clear it out. I advised she would need an appt but she states that she does not. I told her I would send a message.

## 2021-05-15 NOTE — Telephone Encounter (Signed)
Pt states that the medication prescribed by provider Sertraline 100 mg is too strong. She states they work they just make her a little dizzy. She said that she would bite them in half but just wanted to make sure that was okay.

## 2021-05-16 NOTE — Telephone Encounter (Signed)
Lmom asking pt to call back to schedule an appt. °

## 2021-05-16 NOTE — Telephone Encounter (Signed)
Patient returned call said she would fight the cold off. She didn't want to make an appt at all

## 2021-05-16 NOTE — Telephone Encounter (Signed)
Noted  

## 2021-05-16 NOTE — Telephone Encounter (Signed)
Sent patient a message of Laura Soto's recommendations via [place].

## 2021-06-15 ENCOUNTER — Other Ambulatory Visit: Payer: Self-pay

## 2021-06-15 ENCOUNTER — Other Ambulatory Visit: Payer: BC Managed Care – PPO

## 2021-06-15 DIAGNOSIS — E063 Autoimmune thyroiditis: Secondary | ICD-10-CM

## 2021-06-16 LAB — T4, FREE: Free T4: 1.4 ng/dL (ref 0.82–1.77)

## 2021-06-16 LAB — TSH: TSH: 2.63 u[IU]/mL (ref 0.450–4.500)

## 2021-06-16 NOTE — Progress Notes (Signed)
Contacted via MyChart   Good morning Laura Soto, your thyroid labs are nice and normal this check, continue current Levothyroxine dosing.  Have a wonderful weekend. Keep being awesome!!  Thank you for allowing me to participate in your care.  I appreciate you. Kindest regards, Kimaria Struthers

## 2021-06-18 ENCOUNTER — Other Ambulatory Visit: Payer: Self-pay

## 2021-06-18 ENCOUNTER — Telehealth (INDEPENDENT_AMBULATORY_CARE_PROVIDER_SITE_OTHER): Payer: BC Managed Care – PPO | Admitting: Nurse Practitioner

## 2021-06-18 ENCOUNTER — Encounter: Payer: Self-pay | Admitting: Nurse Practitioner

## 2021-06-18 DIAGNOSIS — J3489 Other specified disorders of nose and nasal sinuses: Secondary | ICD-10-CM

## 2021-06-18 LAB — VERITOR FLU A/B WAIVED
Influenza A: NEGATIVE
Influenza B: NEGATIVE

## 2021-06-18 MED ORDER — PREDNISONE 20 MG PO TABS: 40.0000 mg | ORAL_TABLET | Freq: Every day | ORAL | 0 refills | Status: AC

## 2021-06-18 MED ORDER — AMOXICILLIN-POT CLAVULANATE 875-125 MG PO TABS: 1.0000 | ORAL_TABLET | Freq: Two times a day (BID) | ORAL | 0 refills | Status: AC

## 2021-06-18 NOTE — Assessment & Plan Note (Signed)
Ongoing for 3 weeks with waxing and waning.  Will obtain Covid and Flu testing at office today.  Recommend she self quarantine until symptoms improved and testing returns.  Will start Augmentin + Prednisone for treatment.  Recommend: - Increased rest - Increasing Fluids - Acetaminophen as needed for fever/pain.  - Salt water gargling, chloraseptic spray and throat lozenges - OTC pseudoephedrine.  - Mucinex.  - Humidifying the air. Return to office for worsening or ongoing issues.

## 2021-06-18 NOTE — Patient Instructions (Signed)

## 2021-06-18 NOTE — Progress Notes (Signed)
LMP  (LMP Unknown)    Subjective:    Patient ID: Laura Soto, female    DOB: 1960-10-21, 61 y.o.   MRN: [id number]  HPI: Laura Soto is a 61 y.o. female  Chief Complaint  Patient presents with   Cough   Nasal Congestion   Sinusitis    Patient states her symptoms have been ongoing. Patient states she has been dealing with it for a while. Patient states she is thinking that it may be her sinuses and states she has tried over the counter medications and they are not working. Patient states she not taken an at-home test.    Sore Throat   Night Sweats   This visit was completed via telephone due to the restrictions of the COVID-19 pandemic. All issues as above were discussed and addressed but no physical exam was performed. If it was felt that the patient should be evaluated in the office, they were directed there. The patient verbally consented to this visit. Patient was unable to complete an audio/visual visit due to Lack of equipment. Due to the catastrophic nature of the COVID-19 pandemic, this visit was done through audio contact only. Location of the patient: home Location of the provider: work Those involved with this call:  Provider: Marnee Guarneri, DNP CMA: Irena Reichmann, [place] Desk/Registration: [employer]  Time spent on call:  21 minutes on the phone discussing health concerns. 15 minutes total spent in review of patient's record and preparation of their chart.  I verified patient identity using two factors (patient name and date of birth). Patient consents verbally to being seen via telemedicine visit today.    UPPER RESPIRATORY TRACT INFECTION Has had symptoms for a few weeks off and on.  She thinks it is a sinus infection.  Covid not tested for at home.   Has been Covid vaccinated x 2.  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 Worst symptom: congestion Fever: no Cough: yes Shortness of breath: no Wheezing: no Chest pain: no Chest  tightness: yes Chest congestion: no Nasal congestion: yes Runny nose: yes Post nasal drip: yes Sneezing: no Sore throat: yes Swollen glands: no Sinus pressure: yes right side Headache: yes Face pain: no Toothache: no Ear pain: none Ear pressure: none Eyes red/itching:no Eye drainage/crusting: no  Vomiting: no Rash: no Fatigue: yes Sick contacts: no Strep contacts: no  Context: fluctuating Recurrent sinusitis: no Relief with OTC cold/cough medications: no  Treatments attempted: cold/sinus and mucinex    Relevant past medical, surgical, family and social history reviewed and updated as indicated. Interim medical history since our last visit reviewed. Allergies and medications reviewed and updated.  Review of Systems  Constitutional:  Positive for fatigue. Negative for activity change, appetite change, chills and fever.  HENT:  Positive for congestion, postnasal drip, rhinorrhea, sinus pressure, sinus pain and sore throat. Negative for ear discharge, ear pain, facial swelling, sneezing and voice change.   Eyes:  Negative for pain and visual disturbance.  Respiratory:  Positive for cough. Negative for chest tightness, shortness of breath and wheezing.   Cardiovascular:  Negative for chest pain, palpitations and leg swelling.  Gastrointestinal: Negative.   Musculoskeletal:  Negative for myalgias.  Neurological:  Positive for headaches. Negative for dizziness, weakness and numbness.  Psychiatric/Behavioral: Negative.     Per HPI unless specifically indicated above     Objective:    LMP  (LMP Unknown)   Wt Readings from Last 3 Encounters:  04/27/21 146  lb (66.2 kg)  04/12/21 146 lb 12.8 oz (66.6 kg)  02/07/21 148 lb (67.1 kg)    Physical Exam  Unable to perform due to telephone visit only.  Results for orders placed or performed in visit on 06/15/21  TSH  Result Value Ref Range   TSH 2.630 0.450 - 4.500 uIU/mL  T4, free  Result Value Ref Range   Free T4 1.40 0.82  - 1.77 ng/dL      Assessment & Plan:   Problem List Items Addressed This Visit       Other   Sinus pressure - Primary    Ongoing for 3 weeks with waxing and waning.  Will obtain Covid and Flu testing at office today.  Recommend she self quarantine until symptoms improved and testing returns.  Will start Augmentin + Prednisone for treatment.  Recommend: - Increased rest - Increasing Fluids - Acetaminophen as needed for fever/pain.  - Salt water gargling, chloraseptic spray and throat lozenges - OTC pseudoephedrine.  - Mucinex.  - Humidifying the air. Return to office for worsening or ongoing issues.      Relevant Orders   Veritor Flu A/B Waived   Novel Coronavirus, NAA ([facility])    I discussed the assessment and treatment plan with the patient. The patient was provided an opportunity to ask questions and all were answered. The patient agreed with the plan and demonstrated an understanding of the instructions.   The patient was advised to call back or seek an in-person evaluation if the symptoms worsen or if the condition fails to improve as anticipated.   I provided 21+ minutes of time during this encounter.   Follow up plan: Return if symptoms worsen or fail to improve.

## 2021-06-19 LAB — NOVEL CORONAVIRUS, NAA: SARS-CoV-2, NAA: NOT DETECTED

## 2021-06-19 LAB — SARS-COV-2, NAA 2 DAY TAT

## 2021-06-19 NOTE — Progress Notes (Signed)
Good morning crew, please let Laura Soto know her Covid testing returned negative.  Great news!! Keep being awesome!!  Thank you for allowing me to participate in your care.  I appreciate you. Kindest regards, Jacory Kamel

## 2021-07-06 ENCOUNTER — Other Ambulatory Visit: Payer: Self-pay | Admitting: Nurse Practitioner

## 2021-07-06 NOTE — Telephone Encounter (Signed)
Requested medication (s) are due for refill today: yes  Requested medication (s) are on the active medication list: no  Last refill:  unsure  Future visit scheduled: yes  Notes to clinic:  medication was dc'd back in 2021. Please advise     Requested Prescriptions  Pending Prescriptions Disp Refills   omeprazole (PRILOSEC) 20 MG capsule [Pharmacy Med Name: OMEPRAZOLE DR 20 MG CAPSULE] 180 capsule 0    Sig: Take 1 capsule (20 mg total) by mouth 2 (two) times daily before a meal.     Gastroenterology: Proton Pump Inhibitors Passed - 07/06/2021  3:10 PM      Passed - Valid encounter within last 12 months    Recent Outpatient Visits           2 weeks ago Sinus pressure   [place], [place] T, NP   2 months ago Hashimoto's thyroiditis   [place], Hart T, NP   2 months ago Anxiety and depression   [place], [place] T, NP   4 months ago Stage 3a chronic kidney disease ([place])   [place], Jolene T, NP   8 months ago Hypothyroidism, unspecified type   [facility] Jon Billings, NP       Future Appointments             In 1 month [place], Barbaraann Faster, NP [facility]

## 2021-07-20 ENCOUNTER — Encounter: Payer: Self-pay | Admitting: Nurse Practitioner

## 2021-07-25 ENCOUNTER — Encounter: Payer: BC Managed Care – PPO | Admitting: Nurse Practitioner

## 2021-07-25 ENCOUNTER — Other Ambulatory Visit: Payer: Self-pay

## 2021-07-25 VITALS — Wt 148.8 lb

## 2021-07-25 NOTE — Progress Notes (Signed)
Error, patient not seen..

## 2021-08-07 ENCOUNTER — Ambulatory Visit: Payer: BC Managed Care – PPO | Admitting: Nurse Practitioner

## 2021-08-11 ENCOUNTER — Encounter: Payer: Self-pay | Admitting: Nurse Practitioner

## 2021-09-26 ENCOUNTER — Encounter: Payer: Self-pay | Admitting: Nurse Practitioner

## 2021-10-02 ENCOUNTER — Encounter: Payer: Self-pay | Admitting: Nurse Practitioner

## 2021-10-02 ENCOUNTER — Ambulatory Visit: Payer: BC Managed Care – PPO | Admitting: Nurse Practitioner

## 2021-10-02 VITALS — BP 112/74 | HR 68 | Temp 98.8°F | Wt 144.6 lb

## 2021-10-02 DIAGNOSIS — Z0289 Encounter for other administrative examinations: Secondary | ICD-10-CM | POA: Diagnosis not present

## 2021-10-02 DIAGNOSIS — R399 Unspecified symptoms and signs involving the genitourinary system: Secondary | ICD-10-CM | POA: Insufficient documentation

## 2021-10-02 DIAGNOSIS — R8281 Pyuria: Secondary | ICD-10-CM

## 2021-10-02 LAB — MICROSCOPIC EXAMINATION
Bacteria, UA: NONE SEEN
RBC, Urine: NONE SEEN /hpf (ref 0–2)

## 2021-10-02 LAB — URINALYSIS, ROUTINE W REFLEX MICROSCOPIC
Bilirubin, UA: NEGATIVE
Glucose, UA: NEGATIVE
Ketones, UA: NEGATIVE
Nitrite, UA: NEGATIVE
Protein,UA: NEGATIVE
RBC, UA: NEGATIVE
Specific Gravity, UA: 1.02 (ref 1.005–1.030)
Urobilinogen, Ur: 0.2 mg/dL (ref 0.2–1.0)
pH, UA: 6 (ref 5.0–7.5)

## 2021-10-02 MED ORDER — METHOCARBAMOL 750 MG PO TABS: 750.0000 mg | ORAL_TABLET | Freq: Three times a day (TID) | ORAL | 1 refills | Status: DC | PRN

## 2021-10-02 MED ORDER — OMEPRAZOLE 20 MG PO CPDR: 20.0000 mg | DELAYED_RELEASE_CAPSULE | Freq: Two times a day (BID) | ORAL | 4 refills | Status: DC

## 2021-10-02 MED ORDER — SERTRALINE HCL 100 MG PO TABS: 100.0000 mg | ORAL_TABLET | Freq: Every day | ORAL | 4 refills | Status: DC

## 2021-10-02 MED ORDER — BUSPIRONE HCL 10 MG PO TABS: 10.0000 mg | ORAL_TABLET | Freq: Two times a day (BID) | ORAL | 4 refills | Status: DC

## 2021-10-02 MED ORDER — LEVOTHYROXINE SODIUM 112 MCG PO TABS: 112.0000 ug | ORAL_TABLET | Freq: Every day | ORAL | 4 refills | Status: DC

## 2021-10-02 NOTE — Progress Notes (Signed)
? ?BP 112/74   Pulse 68   Temp 98.8 ?F (37.1 ?C) (Oral)   Wt 144 lb 9.6 oz (65.6 kg)   LMP  (LMP Unknown)   SpO2 96%   BMI 24.24 kg/m?   ? ?Subjective:  ? ? Patient ID: Laura Soto, female    DOB: [birth year], 61 y.o.   MRN: [id number] ? ?HPI: ?Laura Soto is a 61 y.o. female ? ?Chief Complaint  ?Patient presents with  ? Urinary Tract Infection  ? ?URINARY SYMPTOMS ?Started before spring break with back discomfort and cloudy urine. ?Dysuria: no ?Urinary frequency: yes ?Urgency: yes ?Small volume voids: yes ?Symptom severity: yes ?Urinary incontinence: no ?Foul odor: no ?Hematuria: no ?Abdominal pain: no ?Back pain: yes ?Suprapubic pain/pressure: no ?Flank pain: no ?Fever:  no ?Vomiting: no ?Status: stable ?Previous urinary tract infection: yes ?Recurrent urinary tract infection: no ?Sexual activity: No sexually active ?History of sexually transmitted disease: no ?Treatments attempted: cranberry and increasing fluids   ? ?Relevant past medical, surgical, family and social history reviewed and updated as indicated. Interim medical history since our last visit reviewed. ?Allergies and medications reviewed and updated. ? ?Review of Systems  ?Constitutional:  Negative for activity change, appetite change, diaphoresis, fatigue and fever.  ?Respiratory:  Negative for cough, chest tightness and shortness of breath.   ?Cardiovascular:  Negative for chest pain, palpitations and leg swelling.  ?Gastrointestinal: Negative.   ?Genitourinary:  Positive for frequency and urgency. Negative for decreased urine volume, dysuria, hematuria and vaginal discharge.  ?Musculoskeletal:  Positive for back pain.  ?Neurological: Negative.   ?Psychiatric/Behavioral: Negative.    ? ?Per HPI unless specifically indicated above ? ?   ?Objective:  ?  ?BP 112/74   Pulse 68   Temp 98.8 ?F (37.1 ?C) (Oral)   Wt 144 lb 9.6 oz (65.6 kg)   LMP  (LMP Unknown)   SpO2 96%   BMI 24.24 kg/m?   ?Wt Readings from Last 3 Encounters:  ?10/02/21 144  lb 9.6 oz (65.6 kg)  ?07/25/21 148 lb 12.8 oz (67.5 kg)  ?04/27/21 146 lb (66.2 kg)  ?  ?Physical Exam ?Vitals and nursing note reviewed.  ?Constitutional:   ?   General: She is awake. She is not in acute distress. ?   Appearance: She is well-developed and well-groomed. She is not ill-appearing or toxic-appearing.  ?HENT:  ?   Head: Normocephalic.  ?   Right Ear: Hearing normal.  ?   Left Ear: Hearing normal.  ?Eyes:  ?   General: Lids are normal.     ?   Right eye: No discharge.     ?   Left eye: No discharge.  ?   Conjunctiva/sclera: Conjunctivae normal.  ?   Pupils: Pupils are equal, round, and reactive to light.  ?Neck:  ?   Thyroid: No thyromegaly.  ?   Vascular: No carotid bruit.  ?Cardiovascular:  ?   Rate and Rhythm: Normal rate and regular rhythm.  ?   Heart sounds: Normal heart sounds. No murmur heard. ?  No gallop.  ?Pulmonary:  ?   Effort: Pulmonary effort is normal. No accessory muscle usage or respiratory distress.  ?   Breath sounds: Normal breath sounds.  ?Abdominal:  ?   General: Bowel sounds are normal. There is no distension.  ?   Palpations: Abdomen is soft.  ?   Tenderness: There is no abdominal tenderness. There is no right CVA tenderness or left CVA tenderness.  ?Musculoskeletal:  ?  Cervical back: Normal range of motion and neck supple.  ?   Lumbar back: Tenderness (at SI joint) present. No swelling, edema or spasms. Normal range of motion. Negative right straight leg raise test and negative left straight leg raise test.  ?   Right lower leg: No edema.  ?   Left lower leg: No edema.  ?Lymphadenopathy:  ?   Cervical: No cervical adenopathy.  ?Skin: ?   General: Skin is warm and dry.  ?Neurological:  ?   Mental Status: She is alert and oriented to person, place, and time.  ?   Deep Tendon Reflexes: Reflexes are normal and symmetric.  ?   Reflex Scores: ?     Brachioradialis reflexes are 2+ on the right side and 2+ on the left side. ?     Patellar reflexes are 2+ on the right side and 2+ on the  left side. ?Psychiatric:     ?   Attention and Perception: Attention normal.     ?   Mood and Affect: Mood normal.     ?   Speech: Speech normal.     ?   Behavior: Behavior normal. Behavior is cooperative.     ?   Thought Content: Thought content normal.  ? ?Results for orders placed or performed in visit on 06/18/21  ?Novel Coronavirus, NAA ([facility])  ? Specimen: Nasopharyngeal(NP) swabs in vial transport medium  ?Result Value Ref Range  ? SARS-CoV-2, NAA Not Detected Not Detected  ?SARS-COV-2, NAA 2 DAY TAT  ?Result Value Ref Range  ? SARS-CoV-2, NAA 2 DAY TAT Performed   ?Veritor Flu A/B Waived  ?Result Value Ref Range  ? Influenza A Negative Negative  ? Influenza B Negative Negative  ? ?   ?Assessment & Plan:  ? ?Problem List Items Addressed This Visit   ? ?  ? Other  ? Urinary symptom or sign - Primary  ?  Acute for over one week.  UA today with 1+ leuks, but overall remainder no signs of UTI.  Will send for culture to further assess and treat as needed, discussed at length with her.  Suspect more SI joint pain, will trial Robaxin as needed and gentle stretching + heat at home. Return for ongoing or worsening pain. ? ?  ?  ? Relevant Orders  ? Urinalysis, Routine w reflex microscopic  ? Urine Culture  ? ?Other Visit Diagnoses   ? ? Pyuria      ? Send for culture.  ? Relevant Orders  ? Urine Culture  ? Encounter for completion of form with patient      ? FMLA forms brought in to complete for mother who is a patient.  ? ?  ?  ? ?Follow up plan: ?Return if symptoms worsen or fail to improve. ? ? ? ? ? ?

## 2021-10-02 NOTE — Patient Instructions (Signed)
Urinary Tract Infection, Adult A urinary tract infection (UTI) is an infection of any part of the urinary tract. The urinary tract includes: The kidneys. The ureters. The bladder. The urethra. These organs make, store, and get rid of pee (urine) in the body. What are the causes? This infection is caused by germs (bacteria) in your genital area. These germs grow and cause swelling (inflammation) of your urinary tract. What increases the risk? The following factors may make you more likely to develop this condition: Using a small, thin tube (catheter) to drain pee. Not being able to control when you pee or poop (incontinence). Being female. If you are female, these things can increase the risk: Using these methods to prevent pregnancy: A medicine that kills sperm (spermicide). A device that blocks sperm (diaphragm). Having low levels of a female hormone (estrogen). Being pregnant. You are more likely to develop this condition if: You have genes that add to your risk. You are sexually active. You take antibiotic medicines. You have trouble peeing because of: A prostate that is bigger than normal, if you are female. A blockage in the part of your body that drains pee from the bladder. A kidney stone. A nerve condition that affects your bladder. Not getting enough to drink. Not peeing often enough. You have other conditions, such as: Diabetes. A weak disease-fighting system (immune system). Sickle cell disease. Gout. Injury of the spine. What are the signs or symptoms? Symptoms of this condition include: Needing to pee right away. Peeing small amounts often. Pain or burning when peeing. Blood in the pee. Pee that smells bad or not like normal. Trouble peeing. Pee that is cloudy. Fluid coming from the vagina, if you are female. Pain in the belly or lower back. Other symptoms include: Vomiting. Not feeling hungry. Feeling mixed up (confused). This may be the first symptom in  older adults. Being tired and grouchy (irritable). A fever. Watery poop (diarrhea). How is this treated? Taking antibiotic medicine. Taking other medicines. Drinking enough water. In some cases, you may need to see a specialist. Follow these instructions at home:  Medicines Take over-the-counter and prescription medicines only as told by your doctor. If you were prescribed an antibiotic medicine, take it as told by your doctor. Do not stop taking it even if you start to feel better. General instructions Make sure you: Pee until your bladder is empty. Do not hold pee for a long time. Empty your bladder after sex. Wipe from front to back after peeing or pooping if you are a female. Use each tissue one time when you wipe. Drink enough fluid to keep your pee pale yellow. Keep all follow-up visits. Contact a doctor if: You do not get better after 1-2 days. Your symptoms go away and then come back. Get help right away if: You have very bad back pain. You have very bad pain in your lower belly. You have a fever. You have chills. You feeling like you will vomit or you vomit. Summary A urinary tract infection (UTI) is an infection of any part of the urinary tract. This condition is caused by germs in your genital area. There are many risk factors for a UTI. Treatment includes antibiotic medicines. Drink enough fluid to keep your pee pale yellow. This information is not intended to replace advice given to you by your health care provider. Make sure you discuss any questions you have with your health care provider. Document Revised: 12/24/2019 Document Reviewed: 12/24/2019 Elsevier Patient Education    2023 Elsevier Inc.  

## 2021-10-02 NOTE — Assessment & Plan Note (Signed)
Acute for over one week.  UA today with 1+ leuks, but overall remainder no signs of UTI.  Will send for culture to further assess and treat as needed, discussed at length with her.  Suspect more SI joint pain, will trial Robaxin as needed and gentle stretching + heat at home. Return for ongoing or worsening pain. ?

## 2021-10-04 ENCOUNTER — Encounter: Payer: Self-pay | Admitting: Nurse Practitioner

## 2021-10-04 LAB — URINE CULTURE

## 2021-10-04 NOTE — Progress Notes (Signed)
Contacted via [place] ? ? ?Good morning Makeila, your urine returned showing <100,000 in growth.  No infection present and no treatment needed at this time.:)

## 2021-12-02 NOTE — Patient Instructions (Signed)

## 2021-12-04 ENCOUNTER — Encounter: Payer: Self-pay | Admitting: Nurse Practitioner

## 2021-12-04 ENCOUNTER — Ambulatory Visit: Payer: BC Managed Care – PPO | Admitting: Nurse Practitioner

## 2021-12-04 VITALS — BP 108/72 | HR 66 | Temp 98.3°F | Wt 143.8 lb

## 2021-12-04 DIAGNOSIS — N1831 Chronic kidney disease, stage 3a: Secondary | ICD-10-CM | POA: Diagnosis not present

## 2021-12-04 DIAGNOSIS — E063 Autoimmune thyroiditis: Secondary | ICD-10-CM | POA: Diagnosis not present

## 2021-12-04 DIAGNOSIS — G4700 Insomnia, unspecified: Secondary | ICD-10-CM | POA: Insufficient documentation

## 2021-12-04 DIAGNOSIS — F5101 Primary insomnia: Secondary | ICD-10-CM

## 2021-12-04 DIAGNOSIS — F419 Anxiety disorder, unspecified: Secondary | ICD-10-CM

## 2021-12-04 DIAGNOSIS — F32A Depression, unspecified: Secondary | ICD-10-CM

## 2021-12-04 LAB — MICROALBUMIN, URINE WAIVED
Creatinine, Urine Waived: 200 mg/dL (ref 10–300)
Microalb, Ur Waived: 80 mg/L — ABNORMAL HIGH (ref 0–19)

## 2021-12-04 MED ORDER — TRAZODONE HCL 50 MG PO TABS: 25.0000 mg | ORAL_TABLET | Freq: Every evening | ORAL | 12 refills | Status: DC | PRN

## 2021-12-04 MED ORDER — LEVOTHYROXINE SODIUM 112 MCG PO TABS: 112.0000 ug | ORAL_TABLET | Freq: Every day | ORAL | 4 refills | Status: DC

## 2021-12-04 MED ORDER — BUSPIRONE HCL 10 MG PO TABS: 10.0000 mg | ORAL_TABLET | Freq: Two times a day (BID) | ORAL | 4 refills | Status: DC

## 2021-12-04 NOTE — Assessment & Plan Note (Signed)
Noted on labs since 2018.  She has cut back on soda and OTC NSAIDs.  Consider nephrology referral if any worsening.  Check BMP, CBC, and urine ALB.  Consider addition of ACE or ARB next visit if ongoing.  Could also consider [facility] [employer] which now has CKD coverage.  Return in 6 months.

## 2021-12-04 NOTE — Assessment & Plan Note (Signed)
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  Will trial Trazodone 25 to 50 MG as needed at night, educated her on this medication use and side effects.   ?Maintain a regular sleep schedule, particularly a regular wake-up time in the morning ?Try not to force sleep ?Avoid caffeinated beverages after lunch ?Avoid alcohol near bedtime (eg, late afternoon and evening)  ?Avoid smoking or other nicotine intake, particularly during the evening ?Adjust the bedroom environment as needed to decrease stimuli (eg, reduce ambient light, turn off the television or radio) ?Avoid prolonged use of light-emitting screens (laptops, tablets, smartphones, ebooks) before bedtime  ?Resolve concerns or worries before bedtime ?Exercise regularly for at least 20 minutes, preferably more than four to five hours prior to bedtime  ?Avoid daytime naps, especially if they are longer than 20 to 30 minutes or occur late in the day

## 2021-12-04 NOTE — Progress Notes (Signed)
BP 108/72   Pulse 66   Temp 98.3 F (36.8 C) (Oral)   Wt 143 lb 12.8 oz (65.2 kg)   LMP  (LMP Unknown)   SpO2 98%   BMI 24.10 kg/m    Subjective:    Patient ID: Laura Soto, female    DOB: May 19, 1961, 61 y.o.   MRN: [id number]  HPI: Laura Soto is a 61 y.o. female  Chief Complaint  Patient presents with   Thyroid Problem    Patient is here for follow up on her Thyroid.    Insomnia    Patient says she is having issues with falling asleep and staying asleep. Patient says she is probably because she is the primary caregiver for her mother. Patient says she has tried Melatonin over the counter and it does not help.    Medication Management    Patient is requesting a year supply prescription as she says her insurance is canceling and she is wanting to discuss it with provider. Patient is requesting Buspar and Thyroid medication.    HYPOTHYROIDISM Continues on Levothyroxine 112 MCG daily.  Recent January 2023 labs with stable TSH. Thyroid control status:stable Satisfied with current treatment? yes Medication side effects: no Medication compliance: good compliance Etiology of hypothyroidism: Hashimoto's Recent dose adjustment:no Fatigue:  sometimes Cold intolerance: no Heat intolerance: no Weight gain: no Weight loss: no Constipation: no Diarrhea/loose stools: no Palpitations: no Lower extremity edema: no Anxiety/depressed mood: yes   CHRONIC KIDNEY DISEASE CKD status: stable Medications renally dose: yes Previous renal evaluation: no Pneumovax:  Not up to Date Influenza Vaccine:  Up to Date   INSOMNIA Ongoing for long while, took Melatonin + has tried sleep aide from [facility].  Helps her go to sleep, but does not stay asleep.  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  She is currently not taking Zoloft, only taking Buspar 10 MG BID.  Duration: months Satisfied with sleep quality: no Difficulty falling asleep: occasional Difficulty staying asleep: yes Waking a  few hours after sleep onset: yes Early morning awakenings: yes Daytime hypersomnolence: no Wakes feeling refreshed:  sometimes Good sleep hygiene: yes Apnea: no Snoring: no Depressed/anxious mood: yes Recent stress: yes Restless legs/nocturnal leg cramps: no Chronic pain/arthritis: no History of sleep study: no Treatments attempted: melatonin and uinsom     12/04/2021   10:52 AM 04/27/2021   11:17 AM 04/12/2021   11:36 AM 02/07/2021    3:38 PM 10/12/2020   11:11 AM  Depression screen PHQ 2/9  Decreased Interest 0 1 3 0 0  Down, Depressed, Hopeless 0 1 3 0 0  PHQ - 2 Score 0 2 6 0 0  Altered sleeping '3 1 3 '$ 0   Tired, decreased energy 0 1 3 0   Change in appetite 0 1 2 0   Feeling bad or failure about yourself  0 1 2 0   Trouble concentrating 0 1 3 0   Moving slowly or fidgety/restless 0 1 3 0   Suicidal thoughts 0 1 0 0   PHQ-9 Score '3 9 22 '$ 0   Difficult doing work/chores Not difficult at all Somewhat difficult Very difficult Not difficult at all        12/04/2021   10:53 AM 04/27/2021   11:17 AM 04/12/2021   11:36 AM 02/07/2021    3:39 PM  GAD 7 : Generalized Anxiety Score  Nervous, Anxious, on Edge 0 1 3 0  Control/stop worrying '3 1 3 '$ 0  Worry  too much - different things '3 1 3 '$ 0  Trouble relaxing 0 1 3 0  Restless 0 1 3 0  Easily annoyed or irritable 0 1 3 0  Afraid - awful might happen 0 1 2 0  Total GAD 7 Score '6 7 20 '$ 0  Anxiety Difficulty Not difficult at all Somewhat difficult Extremely difficult Not difficult at all       Relevant past medical, surgical, family and social history reviewed and updated as indicated. Interim medical history since our last visit reviewed. Allergies and medications reviewed and updated.  Review of Systems  Constitutional:  Negative for activity change, appetite change, diaphoresis, fatigue and fever.  Respiratory: Negative.    Cardiovascular: Negative.   Gastrointestinal: Negative.   Endocrine: Negative for cold intolerance and  heat intolerance.  Neurological: Negative.   Psychiatric/Behavioral:  Positive for sleep disturbance. Negative for decreased concentration, self-injury and suicidal ideas. The patient is not nervous/anxious.     Per HPI unless specifically indicated above     Objective:    BP 108/72   Pulse 66   Temp 98.3 F (36.8 C) (Oral)   Wt 143 lb 12.8 oz (65.2 kg)   LMP  (LMP Unknown)   SpO2 98%   BMI 24.10 kg/m   Wt Readings from Last 3 Encounters:  12/04/21 143 lb 12.8 oz (65.2 kg)  10/02/21 144 lb 9.6 oz (65.6 kg)  07/25/21 148 lb 12.8 oz (67.5 kg)    Physical Exam Vitals and nursing note reviewed.  Constitutional:      General: She is awake. She is not in acute distress.    Appearance: She is well-developed and well-groomed. She is not ill-appearing.  HENT:     Head: Normocephalic.     Right Ear: Hearing normal.     Left Ear: Hearing normal.  Eyes:     General: Lids are normal.        Right eye: No discharge.        Left eye: No discharge.     Conjunctiva/sclera: Conjunctivae normal.     Pupils: Pupils are equal, round, and reactive to light.  Neck:     Thyroid: No thyromegaly.     Vascular: No carotid bruit.  Cardiovascular:     Rate and Rhythm: Normal rate and regular rhythm.     Heart sounds: Normal heart sounds. No murmur heard.    No gallop.  Pulmonary:     Effort: Pulmonary effort is normal. No accessory muscle usage or respiratory distress.     Breath sounds: Normal breath sounds.  Abdominal:     General: Bowel sounds are normal.     Palpations: Abdomen is soft.  Musculoskeletal:     Cervical back: Normal range of motion and neck supple.     Right lower leg: No edema.     Left lower leg: No edema.  Skin:    General: Skin is warm and dry.  Neurological:     Mental Status: She is alert and oriented to person, place, and time.     Deep Tendon Reflexes: Reflexes are normal and symmetric.     Reflex Scores:      Brachioradialis reflexes are 2+ on the right  side and 2+ on the left side.      Patellar reflexes are 2+ on the right side and 2+ on the left side. Psychiatric:        Attention and Perception: Attention normal.        Mood  and Affect: Mood normal.        Speech: Speech normal.        Behavior: Behavior normal. Behavior is cooperative.        Thought Content: Thought content normal.     Results for orders placed or performed in visit on 10/02/21  Urine Culture   Specimen: Urine   UR  Result Value Ref Range   Urine Culture, Routine Final report    Organism ID, Bacteria Comment   Microscopic Examination   Urine  Result Value Ref Range   WBC, UA 0-5 0 - 5 /hpf   RBC, Urine None seen 0 - 2 /hpf   Epithelial Cells (non renal) 0-10 0 - 10 /hpf   Bacteria, UA None seen None seen/Few  Urinalysis, Routine w reflex microscopic  Result Value Ref Range   Specific Gravity, UA 1.020 1.005 - 1.030   pH, UA 6.0 5.0 - 7.5   Color, UA Yellow Yellow   Appearance Ur Clear Clear   Leukocytes,UA 1+ (A) Negative   Protein,UA Negative Negative/Trace   Glucose, UA Negative Negative   Ketones, UA Negative Negative   RBC, UA Negative Negative   Bilirubin, UA Negative Negative   Urobilinogen, Ur 0.2 0.2 - 1.0 mg/dL   Nitrite, UA Negative Negative   Microscopic Examination See below:       Assessment & Plan:   Problem List Items Addressed This Visit       Endocrine   Hashimoto's thyroiditis    Chronic, ongoing.  Continue current Levothyroxine dose and recheck TSH and Free T4 today.  Adjust dose as needed based on results.        Relevant Medications   levothyroxine (SYNTHROID) 112 MCG tablet   Other Relevant Orders   T4, free   TSH     Genitourinary   CKD (chronic kidney disease) stage 3, GFR 30-59 ml/min ([place]) - Primary    Noted on labs since 2018.  She has cut back on soda and OTC NSAIDs.  Consider nephrology referral if any worsening.  Check BMP, CBC, and urine ALB.  Consider addition of ACE or ARB next visit if ongoing.   Could also consider [facility] [employer] which now has CKD coverage.  Return in 6 months.      Relevant Orders   Basic metabolic panel   Microalbumin, Urine Waived   CBC with Differential/Platelet     Other   Anxiety and depression    Chronic and improved at this time.  Continue current medication regimen as is seeing improved mood with this -- Buspar only at this time.  Denies SI/HI.      Relevant Medications   busPIRone (BUSPAR) 10 MG tablet   traZODone (DESYREL) 50 MG tablet   Insomnia    Caregiver to her mother, ongoing.  Will trial Trazodone 25 to 50 MG as needed at night, educated her on this medication use and side effects.   ?Maintain a regular sleep schedule, particularly a regular wake-up time in the morning ?Try not to force sleep ?Avoid caffeinated beverages after lunch ?Avoid alcohol near bedtime (eg, late afternoon and evening)  ?Avoid smoking or other nicotine intake, particularly during the evening ?Adjust the bedroom environment as needed to decrease stimuli (eg, reduce ambient light, turn off the television or radio) ?Avoid prolonged use of light-emitting screens (laptops, tablets, smartphones, ebooks) before bedtime  ?Resolve concerns or worries before bedtime ?Exercise regularly for at least 20 minutes, preferably more than four to five hours prior  to bedtime  ?Avoid daytime naps, especially if they are longer than 20 to 30 minutes or occur late in the day         Follow up plan: Return in about 6 months (around 06/06/2022) for Annual physical.

## 2021-12-04 NOTE — Assessment & Plan Note (Signed)
Chronic, ongoing.  Continue current Levothyroxine dose and recheck TSH and Free T4 today.  Adjust dose as needed based on results.

## 2021-12-04 NOTE — Assessment & Plan Note (Signed)
Chronic and improved at this time.  Continue current medication regimen as is seeing improved mood with this -- Buspar only at this time.  Denies SI/HI.

## 2021-12-05 LAB — CBC WITH DIFFERENTIAL/PLATELET
Basophils Absolute: 0.1 10*3/uL (ref 0.0–0.2)
Basos: 1 %
EOS (ABSOLUTE): 0.2 10*3/uL (ref 0.0–0.4)
Eos: 2 %
Hematocrit: 42.4 % (ref 34.0–46.6)
Hemoglobin: 13.9 g/dL (ref 11.1–15.9)
Immature Grans (Abs): 0 10*3/uL (ref 0.0–0.1)
Immature Granulocytes: 0 %
Lymphocytes Absolute: 4 10*3/uL — ABNORMAL HIGH (ref 0.7–3.1)
Lymphs: 42 %
MCH: 28.9 pg (ref 26.6–33.0)
MCHC: 32.8 g/dL (ref 31.5–35.7)
MCV: 88 fL (ref 79–97)
Monocytes Absolute: 0.6 10*3/uL (ref 0.1–0.9)
Monocytes: 6 %
Neutrophils Absolute: 4.8 10*3/uL (ref 1.4–7.0)
Neutrophils: 49 %
Platelets: 246 10*3/uL (ref 150–450)
RBC: 4.81 x10E6/uL (ref 3.77–5.28)
RDW: 13.7 % (ref 11.7–15.4)
WBC: 9.6 10*3/uL (ref 3.4–10.8)

## 2021-12-05 LAB — TSH: TSH: 1.04 u[IU]/mL (ref 0.450–4.500)

## 2021-12-05 LAB — BASIC METABOLIC PANEL
BUN/Creatinine Ratio: 15 (ref 12–28)
BUN: 20 mg/dL (ref 8–27)
CO2: 22 mmol/L (ref 20–29)
Calcium: 9.9 mg/dL (ref 8.7–10.3)
Chloride: 105 mmol/L (ref 96–106)
Creatinine, Ser: 1.34 mg/dL — ABNORMAL HIGH (ref 0.57–1.00)
Glucose: 72 mg/dL (ref 70–99)
Potassium: 4.8 mmol/L (ref 3.5–5.2)
Sodium: 141 mmol/L (ref 134–144)
eGFR: 45 mL/min/{1.73_m2} — ABNORMAL LOW (ref >59)

## 2021-12-05 LAB — T4, FREE: Free T4: 1.75 ng/dL (ref 0.82–1.77)

## 2021-12-05 NOTE — Progress Notes (Signed)
Contacted via [place] morning Laura Soto, your labs have returned: - Kidney function, creatinine and eGFR, continues to show stage 3a kidney disease. No worsening.  I would recommend in future adding on a very low dose of a blood pressure medication called Olmesartan -- not so much for blood pressure but more for kidney protection and to help keep kidneys stable.  Would you like to add this on now? - Remainder of labs are all stable, no medication changes needed.  Any questions? Keep being stellar!!  Thank you for allowing me to participate in your care.  I appreciate you. Kindest regards, Christiona Siddique

## 2021-12-12 ENCOUNTER — Ambulatory Visit: Payer: BC Managed Care – PPO | Admitting: Nurse Practitioner

## 2021-12-23 ENCOUNTER — Encounter: Payer: Self-pay | Admitting: Nurse Practitioner

## 2021-12-24 MED ORDER — OLMESARTAN MEDOXOMIL 5 MG PO TABS: 5.0000 mg | ORAL_TABLET | Freq: Every day | ORAL | 4 refills | Status: DC

## 2021-12-31 NOTE — Patient Instructions (Incomplete)
Please call to schedule your mammogram and/or bone density: [facility]  Address: [address], [place], [place] [address] Phone: [phone]  [facility] [place] [address] [place],  [place]  [address] Phone: [phone]    Pap Test Why am I having this test? A Pap test, also called a Pap smear, is a screening test to check for signs of: Infection. Cancer of the cervix. The cervix is the lower part of the uterus that opens into the vagina. Changes that may be a sign that cancer is developing (precancerous changes). Women need this test on a regular basis. In general, you should have a Pap test every 3 years until you reach menopause or age 65. Women aged 30-60 may choose to have their Pap test done at the same time as an HPV (human papillomavirus) test every 5 years (instead of every 3 years). Your health care provider may recommend having Pap tests more or less often depending on your medical conditions and past Pap test results. What is being tested? Cervical cells are tested for signs of infection or abnormalities. What kind of sample is taken?  Your health care provider will collect a sample of cells from the surface of your cervix. This will be done using a small cotton swab, plastic spatula, or brush that is inserted into your vagina using a tool called a speculum. This sample is often collected during a pelvic exam, when you are lying on your back on an exam table with your feet in footrests (stirrups). In some cases, fluids (secretions) from the cervix or vagina may also be collected. How do I prepare for this test? Be aware of where you are in your menstrual cycle. If you are menstruating on the day of the test, you may be asked to reschedule. You may need to reschedule if you have a known vaginal infection on the day of the test. Follow instructions from your health care provider about: Changing or  stopping your regular medicines. Some medicines can cause abnormal test results, such as vaginal medicines and tetracycline. Avoiding douching 2-3 days before or the day of the test. Tell a health care provider about: Any allergies you have. All medicines you are taking, including vitamins, herbs, eye drops, creams, and over-the-counter medicines. Any bleeding problems you have. Any surgeries you have had. Any medical conditions you have. Whether you are pregnant or may be pregnant. How are the results reported? Your test results will be reported as either abnormal or normal. What do the results mean? A normal test result means that you do not have signs of cancer of the cervix. An abnormal result may mean that you have: Cancer. A Pap test by itself is not enough to diagnose cancer. You will have more tests done if cancer is suspected. Precancerous changes in your cervix. Inflammation of the cervix. An STI (sexually transmitted infection). A fungal infection. A parasite infection. Talk with your health care provider about what your results mean. In some cases, your health care provider may do more testing to confirm the results. Questions to ask your health care provider Ask your health care provider, or the department that is doing the test: When will my results be ready? How will I get my results? What are my treatment options? What other tests do I need? What are my next steps? Summary In general, women should have a Pap test every 3 years until they reach   menopause or age 65. Your health care provider will collect a sample of cells from the surface of your cervix. This will be done using a small cotton swab, plastic spatula, or brush. In some cases, fluids (secretions) from the cervix or vagina may also be collected. This information is not intended to replace advice given to you by your health care provider. Make sure you discuss any questions you have with your health care  provider. Document Revised: 08/11/2020 Document Reviewed: 08/11/2020 Elsevier Patient Education  2023 [employer]  

## 2022-01-01 ENCOUNTER — Other Ambulatory Visit (HOSPITAL_COMMUNITY)
Admission: RE | Admit: 2022-01-01 | Discharge: 2022-01-01 | Disposition: A | Discharge status: Home / Self Care | Payer: BC Managed Care – PPO | Source: Ambulatory Visit | Attending: Nurse Practitioner | Admitting: Nurse Practitioner

## 2022-01-01 ENCOUNTER — Encounter: Payer: Self-pay | Admitting: Nurse Practitioner

## 2022-01-01 ENCOUNTER — Ambulatory Visit: Payer: BC Managed Care – PPO | Admitting: Nurse Practitioner

## 2022-01-01 VITALS — BP 108/72 | HR 67 | Temp 98.6°F | Ht 64.0 in | Wt 143.0 lb

## 2022-01-01 DIAGNOSIS — E559 Vitamin D deficiency, unspecified: Secondary | ICD-10-CM

## 2022-01-01 DIAGNOSIS — F419 Anxiety disorder, unspecified: Secondary | ICD-10-CM

## 2022-01-01 DIAGNOSIS — N1831 Chronic kidney disease, stage 3a: Secondary | ICD-10-CM

## 2022-01-01 DIAGNOSIS — Z13220 Encounter for screening for lipoid disorders: Secondary | ICD-10-CM | POA: Diagnosis not present

## 2022-01-01 DIAGNOSIS — Z1231 Encounter for screening mammogram for malignant neoplasm of breast: Secondary | ICD-10-CM

## 2022-01-01 DIAGNOSIS — F5101 Primary insomnia: Secondary | ICD-10-CM

## 2022-01-01 DIAGNOSIS — M545 Low back pain, unspecified: Secondary | ICD-10-CM

## 2022-01-01 DIAGNOSIS — Z136 Encounter for screening for cardiovascular disorders: Secondary | ICD-10-CM

## 2022-01-01 DIAGNOSIS — Z124 Encounter for screening for malignant neoplasm of cervix: Secondary | ICD-10-CM

## 2022-01-01 DIAGNOSIS — Z0000 Encounter for general adult medical examination without abnormal findings: Secondary | ICD-10-CM

## 2022-01-01 DIAGNOSIS — E063 Autoimmune thyroiditis: Secondary | ICD-10-CM | POA: Diagnosis not present

## 2022-01-01 DIAGNOSIS — F32A Depression, unspecified: Secondary | ICD-10-CM

## 2022-01-01 LAB — URINALYSIS, ROUTINE W REFLEX MICROSCOPIC
Bilirubin, UA: NEGATIVE
Glucose, UA: NEGATIVE
Ketones, UA: NEGATIVE
Nitrite, UA: NEGATIVE
Protein,UA: NEGATIVE
RBC, UA: NEGATIVE
Specific Gravity, UA: 1.02 (ref 1.005–1.030)
Urobilinogen, Ur: 0.2 mg/dL (ref 0.2–1.0)
pH, UA: 5.5 (ref 5.0–7.5)

## 2022-01-01 LAB — WET PREP FOR TRICH, YEAST, CLUE
Clue Cell Exam: NEGATIVE
Trichomonas Exam: NEGATIVE
Yeast Exam: NEGATIVE

## 2022-01-01 LAB — MICROSCOPIC EXAMINATION
Bacteria, UA: NONE SEEN
Epithelial Cells (non renal): NONE SEEN /hpf (ref 0–10)
RBC, Urine: NONE SEEN /hpf (ref 0–2)

## 2022-01-01 NOTE — Assessment & Plan Note (Signed)
Noted on labs since 2018.  She has cut back on soda and OTC NSAIDs, although recently with back pain did take Ibuprofen.  Consider nephrology referral if any worsening.  Check CMP, CBC, and UA today.  Continue Olmesartan for kidney protection, which she is tolerating.  Could also consider [facility] [employer] in future, which now has CKD coverage.  Return in 6 months.

## 2022-01-01 NOTE — Progress Notes (Signed)
Contacted via [place] afternoon Laura Soto, your labs have returned.  Urine stable, does not look like infection present.  Wet prep shows no vaginal infection.  Any questions?

## 2022-01-01 NOTE — Assessment & Plan Note (Signed)
Chronic and stable.  Continue current medication regimen as is seeing improved mood with this -- Buspar only at this time.  Denies SI/HI.

## 2022-01-01 NOTE — Assessment & Plan Note (Signed)
Chronic, ongoing.  Continue current Levothyroxine dose and recheck TSH and Free T4 today.  Adjust dose as needed based on results.

## 2022-01-01 NOTE — Assessment & Plan Note (Signed)
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  Will continue Trazodone 25 to 50 MG as needed at night, educated her on this medication use and side effects.   ?Maintain a regular sleep schedule, particularly a regular wake-up time in the morning ?Try not to force sleep ?Avoid caffeinated beverages after lunch ?Avoid alcohol near bedtime (eg, late afternoon and evening)  ?Avoid smoking or other nicotine intake, particularly during the evening ?Adjust the bedroom environment as needed to decrease stimuli (eg, reduce ambient light, turn off the television or radio) ?Avoid prolonged use of light-emitting screens (laptops, tablets, smartphones, ebooks) before bedtime  ?Resolve concerns or worries before bedtime ?Exercise regularly for at least 20 minutes, preferably more than four to five hours prior to bedtime  ?Avoid daytime naps, especially if they are longer than 20 to 30 minutes or occur late in the day

## 2022-01-01 NOTE — Assessment & Plan Note (Addendum)
Acute, lower mid back.  UA trace leuks, remainder negative and wet prep negative.  Suspect musculoskeletal in nature based on lab and assessment.  Avoid Ibuprofen.  Recommend use of Tylenol as needed + alternating heat and ice.  Monitor temperature daily.  If worsening symptoms, fever, or continued issues return to office immediately.

## 2022-01-01 NOTE — Progress Notes (Signed)
BP 108/72   Pulse 67   Temp 98.6 F (37 C) (Oral)   Ht 5' 4"  (1.626 m)   Wt 143 lb (64.9 kg)   LMP  (LMP Unknown)   SpO2 (!) 89%   BMI 24.55 kg/m    Subjective:    Patient ID: Laura Soto, female    DOB: [birth year], 61 y.o.   MRN: [id number]  HPI: Laura Soto is a 61 y.o. female presenting on 01/01/2022 for comprehensive medical examination. Current medical complaints include:none  She currently lives with: Menopausal Symptoms: no  CHRONIC KIDNEY DISEASE Started Olmesartan for proteinuria and CKD labs. CKD status: stable Medications renally dose: yes Previous renal evaluation: no Pneumovax:  Not up to Date Influenza Vaccine:  Up to Date   BACK PAIN Having some back pain, that she reports started after starting Olmesartan.  She took Ibuprofen which helped because she could barely walk.  Reports her back and belly hurt at time.  Has improved at this time. Duration: days Mechanism of injury: unknown Location: Right and low back Onset: gradual Severity: 10/10 Quality: dull, aching, and throbbing Frequency: intermittent Radiation: none Aggravating factors: lifting, movement, walking, and prolonged sitting Alleviating factors: ice, heat, NSAIDs, and APAP Status: better Treatments attempted: ibuprofen  Relief with NSAIDs?: moderate Nighttime pain:  no Paresthesias / decreased sensation:  no Bowel / bladder incontinence:  no Fevers:  no Dysuria / urinary frequency:  no   DEPRESSION Continues on Trazodone PRN and Buspar, sometimes alternates to Melatonin.  Continues on her thyroid medication daily Mood status: stable Satisfied with current treatment?: yes Symptom severity: mild  Duration of current treatment : chronic Side effects: no Medication compliance: good compliance Psychotherapy/counseling: none Depressed mood: yes Anxious mood: yes Anhedonia: no Significant weight loss or gain: no Insomnia: yes hard to fall asleep Fatigue: no Feelings of  worthlessness or guilt: no Impaired concentration/indecisiveness: no Suicidal ideations: no Hopelessness: no Crying spells: yes    12/04/2021   10:52 AM 04/27/2021   11:17 AM 04/12/2021   11:36 AM 02/07/2021    3:38 PM 10/12/2020   11:11 AM  Depression screen PHQ 2/9  Decreased Interest 0 1 3 0 0  Down, Depressed, Hopeless 0 1 3 0 0  PHQ - 2 Score 0 2 6 0 0  Altered sleeping 3 1 3  0   Tired, decreased energy 0 1 3 0   Change in appetite 0 1 2 0   Feeling bad or failure about yourself  0 1 2 0   Trouble concentrating 0 1 3 0   Moving slowly or fidgety/restless 0 1 3 0   Suicidal thoughts 0 1 0 0   PHQ-9 Score 3 9 22  0   Difficult doing work/chores Not difficult at all Somewhat difficult Very difficult Not difficult at all        12/04/2021   10:53 AM 04/27/2021   11:17 AM 04/12/2021   11:36 AM 02/07/2021    3:39 PM  GAD 7 : Generalized Anxiety Score  Nervous, Anxious, on Edge 0 1 3 0  Control/stop worrying 3 1 3  0  Worry too much - different things 3 1 3  0  Trouble relaxing 0 1 3 0  Restless 0 1 3 0  Easily annoyed or irritable 0 1 3 0  Afraid - awful might happen 0 1 2 0  Total GAD 7 Score 6 7 20  0  Anxiety Difficulty Not difficult at all Somewhat difficult Extremely difficult Not difficult  at all   Functional Status Survey: Is the patient deaf or have difficulty hearing?: No Does the patient have difficulty seeing, even when wearing glasses/contacts?: No Does the patient have difficulty concentrating, remembering, or making decisions?: No Does the patient have difficulty walking or climbing stairs?: No Does the patient have difficulty dressing or bathing?: No Does the patient have difficulty doing errands alone such as visiting a doctor's office or shopping?: No      08/16/2016    3:08 PM 10/22/2018    8:28 AM 06/30/2020    2:52 PM 12/04/2021   10:50 AM 01/01/2022   11:50 AM  Conception in the past year? No 0 0 0 0  Was there an injury with Fall?  0  0 0  Fall  Risk Category Calculator  0  0 0  Fall Risk Category  Low  Low Low  Patient Fall Risk Level  Low fall risk  Low fall risk Low fall risk  Patient at Risk for Falls Due to    No Fall Risks No Fall Risks  Fall risk Follow up    Falls evaluation completed Falls evaluation completed     Past Medical History:  Past Medical History:  Diagnosis Date   Abnormal mammogram, unspecified    Breast screening, unspecified    Depression    GERD (gastroesophageal reflux disease)    Hypercalcemia    Hyperlipidemia    Hypothyroidism    Osteopenia    Personal history of tobacco use, presenting hazards to health     Surgical History:  Past Surgical History:  Procedure Laterality Date   COLONOSCOPY WITH PROPOFOL N/A 06/25/2017   Procedure: COLONOSCOPY WITH PROPOFOL;  Surgeon: Jonathon Bellows, MD;  Location[facility] ENDOSCOPY;  Service: Gastroenterology;  Laterality: N/A;   ESOPHAGOGASTRODUODENOSCOPY (EGD) WITH PROPOFOL N/A 06/25/2017   Procedure: ESOPHAGOGASTRODUODENOSCOPY (EGD) WITH PROPOFOL;  Surgeon: Jonathon Bellows, MD;  Location: [facility] ENDOSCOPY;  Service: Gastroenterology;  Laterality: N/A;   WISDOM TOOTH EXTRACTION      Medications:  Current Outpatient Medications on File Prior to Visit  Medication Sig   busPIRone (BUSPAR) 10 MG tablet Take 1 tablet (10 mg total) by mouth 2 (two) times daily.   estradiol (ESTRACE VAGINAL) 0.1 MG/GM vaginal cream Place 1 Applicatorful vaginally at bedtime. Apply one application vaginally daily at bedtime for 2 weeks and then reduce to one application vaginally at bedtime twice weekly.   levothyroxine (SYNTHROID) 112 MCG tablet Take 1 tablet (112 mcg total) by mouth daily before breakfast.   olmesartan (BENICAR) 5 MG tablet Take 1 tablet (5 mg total) by mouth daily.   traZODone (DESYREL) 50 MG tablet Take 0.5-1 tablets (25-50 mg total) by mouth at bedtime as needed for sleep.   No current facility-administered medications on file prior to visit.    Allergies:  No Known  Allergies  Social History:  Social History   Socioeconomic History   Marital status: Divorced    Spouse name: Not on file   Number of children: Not on file   Years of education: Not on file   Highest education level: Not on file  Occupational History   Not on file  Tobacco Use   Smoking status: Former    Packs/day: 0.25    Years: 20.00    Total pack years: 5.00    Types: Cigarettes    Quit date: 12/26/2014    Years since quitting: 7.0   Smokeless tobacco: Never  Vaping Use   Vaping Use: Never  used  Substance and Sexual Activity   Alcohol use: Yes    Comment: occasionally - wine. none last 24hrs   Drug use: No   Sexual activity: Never  Other Topics Concern   Not on file  Social History Narrative   Not on file   Social Determinants of Health   Financial Resource Strain: Not on file  Food Insecurity: Not on file  Transportation Needs: Not on file  Physical Activity: Not on file  Stress: Not on file  Social Connections: Not on file  Intimate Partner Violence: Not on file   Social History   Tobacco Use  Smoking Status Former   Packs/day: 0.25   Years: 20.00   Total pack years: 5.00   Types: Cigarettes   Quit date: 12/26/2014   Years since quitting: 7.0  Smokeless Tobacco Never   Social History   Substance and Sexual Activity  Alcohol Use Yes   Comment: occasionally - wine. none last 24hrs    Family History:  Family History  Problem Relation Age of Onset   Liver cancer Father        age 5   Diabetes Father    Stroke Mother    Atrial fibrillation Mother     Past medical history, surgical history, medications, allergies, family history and social history reviewed with patient today and changes made to appropriate areas of the chart.   ROS All other ROS negative except what is listed above and in the HPI.      Objective:    BP 108/72   Pulse 67   Temp 98.6 F (37 C) (Oral)   Ht 5' 4"  (1.626 m)   Wt 143 lb (64.9 kg)   LMP  (LMP Unknown)    SpO2 (!) 89%   BMI 24.55 kg/m   Wt Readings from Last 3 Encounters:  01/01/22 143 lb (64.9 kg)  12/04/21 143 lb 12.8 oz (65.2 kg)  10/02/21 144 lb 9.6 oz (65.6 kg)    Physical Exam Vitals and nursing note reviewed. Exam conducted with a chaperone present.  Constitutional:      General: She is awake. She is not in acute distress.    Appearance: She is well-developed. She is not ill-appearing.  HENT:     Head: Normocephalic and atraumatic.     Right Ear: Hearing, tympanic membrane, ear canal and external ear normal. No drainage.     Left Ear: Hearing, tympanic membrane, ear canal and external ear normal. No drainage.     Nose: Nose normal.     Right Sinus: No maxillary sinus tenderness or frontal sinus tenderness.     Left Sinus: No maxillary sinus tenderness or frontal sinus tenderness.     Mouth/Throat:     Mouth: Mucous membranes are moist.     Pharynx: Oropharynx is clear. Uvula midline. No pharyngeal swelling, oropharyngeal exudate or posterior oropharyngeal erythema.  Eyes:     General: Lids are normal.        Right eye: No discharge.        Left eye: No discharge.     Extraocular Movements: Extraocular movements intact.     Conjunctiva/sclera: Conjunctivae normal.     Pupils: Pupils are equal, round, and reactive to light.     Visual Fields: Right eye visual fields normal and left eye visual fields normal.  Neck:     Thyroid: No thyromegaly.     Vascular: No carotid bruit.     Trachea: Trachea normal.  Cardiovascular:  Rate and Rhythm: Normal rate and regular rhythm.     Heart sounds: Normal heart sounds. No murmur heard.    No gallop.  Pulmonary:     Effort: Pulmonary effort is normal. No accessory muscle usage or respiratory distress.     Breath sounds: Normal breath sounds.  Chest:  Breasts:    Right: Normal.     Left: Normal.  Abdominal:     General: Bowel sounds are normal.     Palpations: Abdomen is soft. There is no hepatomegaly or splenomegaly.      Tenderness: There is no abdominal tenderness.     Hernia: There is no hernia in the left inguinal area or right inguinal area.  Genitourinary:    Exam position: Lithotomy position.     Pubic Area: No rash.      Labia:        Right: No rash.        Left: No rash.      Vagina: Erythema (vaginal atrophy) present.     Cervix: Discharge and friability (scant blood with pap, alerted patient.) present.     Uterus: Normal.      Adnexa: Right adnexa normal and left adnexa normal.     Rectum: Normal.  Musculoskeletal:        General: Normal range of motion.     Cervical back: Normal range of motion and neck supple.     Right lower leg: No edema.     Left lower leg: No edema.  Lymphadenopathy:     Head:     Right side of head: No submental, submandibular, tonsillar, preauricular or posterior auricular adenopathy.     Left side of head: No submental, submandibular, tonsillar, preauricular or posterior auricular adenopathy.     Cervical: No cervical adenopathy.     Upper Body:     Right upper body: No supraclavicular, axillary or pectoral adenopathy.     Left upper body: No supraclavicular, axillary or pectoral adenopathy.  Skin:    General: Skin is warm and dry.     Capillary Refill: Capillary refill takes less than 2 seconds.     Findings: No rash.  Neurological:     Mental Status: She is alert and oriented to person, place, and time.     Gait: Gait is intact.     Deep Tendon Reflexes: Reflexes are normal and symmetric.     Reflex Scores:      Brachioradialis reflexes are 2+ on the right side and 2+ on the left side.      Patellar reflexes are 2+ on the right side and 2+ on the left side. Psychiatric:        Attention and Perception: Attention normal.        Mood and Affect: Mood normal.        Speech: Speech normal.        Behavior: Behavior normal. Behavior is cooperative.        Thought Content: Thought content normal.        Judgment: Judgment normal.     Results for orders  placed or performed in visit on 12/04/21  T4, free  Result Value Ref Range   Free T4 1.75 0.82 - 1.77 ng/dL  TSH  Result Value Ref Range   TSH 1.040 0.450 - 4.500 uIU/mL  Basic metabolic panel  Result Value Ref Range   Glucose 72 70 - 99 mg/dL   BUN 20 8 - 27 mg/dL   Creatinine, Ser 1.34 (  H) 0.57 - 1.00 mg/dL   eGFR 45 (L) >59 mL/min/1.73   BUN/Creatinine Ratio 15 12 - 28   Sodium 141 134 - 144 mmol/L   Potassium 4.8 3.5 - 5.2 mmol/L   Chloride 105 96 - 106 mmol/L   CO2 22 20 - 29 mmol/L   Calcium 9.9 8.7 - 10.3 mg/dL  Microalbumin, Urine Waived  Result Value Ref Range   Microalb, Ur Waived 80 (H) 0 - 19 mg/L   Creatinine, Urine Waived 200 10 - 300 mg/dL   Microalb/Creat Ratio 30-300 (H) <30 mg/g  CBC with Differential/Platelet  Result Value Ref Range   WBC 9.6 3.4 - 10.8 x10E3/uL   RBC 4.81 3.77 - 5.28 x10E6/uL   Hemoglobin 13.9 11.1 - 15.9 g/dL   Hematocrit 42.4 34.0 - 46.6 %   MCV 88 79 - 97 fL   MCH 28.9 26.6 - 33.0 pg   MCHC 32.8 31.5 - 35.7 g/dL   RDW 13.7 11.7 - 15.4 %   Platelets 246 150 - 450 x10E3/uL   Neutrophils 49 Not Estab. %   Lymphs 42 Not Estab. %   Monocytes 6 Not Estab. %   Eos 2 Not Estab. %   Basos 1 Not Estab. %   Neutrophils Absolute 4.8 1.4 - 7.0 x10E3/uL   Lymphocytes Absolute 4.0 (H) 0.7 - 3.1 x10E3/uL   Monocytes Absolute 0.6 0.1 - 0.9 x10E3/uL   EOS (ABSOLUTE) 0.2 0.0 - 0.4 x10E3/uL   Basophils Absolute 0.1 0.0 - 0.2 x10E3/uL   Immature Granulocytes 0 Not Estab. %   Immature Grans (Abs) 0.0 0.0 - 0.1 x10E3/uL      Assessment & Plan:   Problem List Items Addressed This Visit       Endocrine   Hashimoto's thyroiditis    Chronic, ongoing.  Continue current Levothyroxine dose and recheck TSH and Free T4 today.  Adjust dose as needed based on results.        Relevant Orders   TSH   T4, free     Genitourinary   CKD (chronic kidney disease) stage 3, GFR 30-59 ml/min (HCC) - Primary    Noted on labs since 2018.  She has cut back on  soda and OTC NSAIDs, although recently with back pain did take Ibuprofen.  Consider nephrology referral if any worsening.  Check CMP, CBC, and UA today.  Continue Olmesartan for kidney protection, which she is tolerating.  Could also consider [facility] in future, which now has CKD coverage.  Return in 6 months.        Other   Acute right-sided low back pain without sciatica    Acute, lower mid back.  UA trace leuks, remainder negative and wet prep negative.  Suspect musculoskeletal in nature based on lab and assessment.  Avoid Ibuprofen.  Recommend use of Tylenol as needed + alternating heat and ice.  Monitor temperature daily.  If worsening symptoms, fever, or continued issues return to office immediately.        Relevant Orders   Urinalysis, Routine w reflex microscopic   WET PREP FOR TRICH, YEAST, CLUE   Anxiety and depression    Chronic and stable.  Continue current medication regimen as is seeing improved mood with this -- Buspar only at this time.  Denies SI/HI.      Insomnia    Caregiver to her mother, ongoing.  Will continue Trazodone 25 to 50 MG as needed at night, educated her on this medication use and side effects.   ?  Maintain a regular sleep schedule, particularly a regular wake-up time in the morning ?Try not to force sleep ?Avoid caffeinated beverages after lunch ?Avoid alcohol near bedtime (eg, late afternoon and evening)  ?Avoid smoking or other nicotine intake, particularly during the evening ?Adjust the bedroom environment as needed to decrease stimuli (eg, reduce ambient light, turn off the television or radio) ?Avoid prolonged use of light-emitting screens (laptops, tablets, smartphones, ebooks) before bedtime  ?Resolve concerns or worries before bedtime ?Exercise regularly for at least 20 minutes, preferably more than four to five hours prior to bedtime  ?Avoid daytime naps, especially if they are longer than 20 to 30 minutes or occur late in the day       Other  Visit Diagnoses     Vitamin D deficiency       History of low levels reported, check today and start supplement as needed.   Relevant Orders   VITAMIN D 25 Hydroxy (Vit-D Deficiency, Fractures)   Cervical cancer screening       Cervical cancer screening today.   Relevant Orders   Cytology - PAP   Encounter for lipid screening for cardiovascular disease       Lipid panel on labs today.   Relevant Orders   Comprehensive metabolic panel   Lipid Panel w/o Chol/HDL Ratio   Encounter for screening mammogram for malignant neoplasm of breast       Mammogram ordered   Relevant Orders   MM 3D SCREEN BREAST BILATERAL   Encounter for annual physical exam       Annual physical with labs today and health maintenance reviewed with patient.   Relevant Orders   CBC with Differential/Platelet        Follow up plan: Return in about 6 months (around 07/04/2022) for HYPOTHYROID, CKD, MOOD.   LABORATORY TESTING:  - Pap smear: pap done  IMMUNIZATIONS:   - Tdap: Tetanus vaccination status reviewed: last tetanus booster within 10 years. - Influenza: Refused - Pneumovax: Not applicable - Prevnar: Not applicable - COVID: Up to date - HPV: Not applicable - Shingrix vaccine: Refused  SCREENING: -Mammogram: Ordered today  - Colonoscopy: Refused  - Bone Density: Not applicable  -Hearing Test: Not applicable  -Spirometry: Not applicable   PATIENT COUNSELING:   Advised to take 1 mg of folate supplement per day if capable of pregnancy.   Sexuality: Discussed sexually transmitted diseases, partner selection, use of condoms, avoidance of unintended pregnancy  and contraceptive alternatives.   Advised to avoid cigarette smoking.  I discussed with the patient that most people either abstain from alcohol or drink within safe limits (<=14/week and <=4 drinks/occasion for males, <=7/weeks and <= 3 drinks/occasion for females) and that the risk for alcohol disorders and other health effects rises  proportionally with the number of drinks per week and how often a drinker exceeds daily limits.  Discussed cessation/primary prevention of drug use and availability of treatment for abuse.   Diet: Encouraged to adjust caloric intake to maintain  or achieve ideal body weight, to reduce intake of dietary saturated fat and total fat, to limit sodium intake by avoiding high sodium foods and not adding table salt, and to maintain adequate dietary potassium and calcium preferably from fresh fruits, vegetables, and low-fat dairy products.    Stressed the importance of regular exercise  Injury prevention: Discussed safety belts, safety helmets, smoke detector, smoking near bedding or upholstery.   Dental health: Discussed importance of regular tooth brushing, flossing, and dental visits.  NEXT PREVENTATIVE PHYSICAL DUE IN 1 YEAR. Return in about 6 months (around 07/04/2022) for HYPOTHYROID, CKD, MOOD.

## 2022-01-02 ENCOUNTER — Other Ambulatory Visit: Payer: Self-pay | Admitting: Nurse Practitioner

## 2022-01-02 DIAGNOSIS — E7800 Pure hypercholesterolemia, unspecified: Secondary | ICD-10-CM | POA: Insufficient documentation

## 2022-01-02 DIAGNOSIS — E063 Autoimmune thyroiditis: Secondary | ICD-10-CM

## 2022-01-02 LAB — COMPREHENSIVE METABOLIC PANEL
ALT: 17 IU/L (ref 0–32)
AST: 22 IU/L (ref 0–40)
Albumin/Globulin Ratio: 1.5 (ref 1.2–2.2)
Albumin: 4.9 g/dL (ref 3.9–4.9)
Alkaline Phosphatase: 108 IU/L (ref 44–121)
BUN/Creatinine Ratio: 18 (ref 12–28)
BUN: 25 mg/dL (ref 8–27)
Bilirubin Total: 0.2 mg/dL (ref 0.0–1.2)
CO2: 16 mmol/L — ABNORMAL LOW (ref 20–29)
Calcium: 10.1 mg/dL (ref 8.7–10.3)
Chloride: 103 mmol/L (ref 96–106)
Creatinine, Ser: 1.37 mg/dL — ABNORMAL HIGH (ref 0.57–1.00)
Globulin, Total: 3.2 g/dL (ref 1.5–4.5)
Glucose: 82 mg/dL (ref 70–99)
Potassium: 4.8 mmol/L (ref 3.5–5.2)
Sodium: 140 mmol/L (ref 134–144)
Total Protein: 8.1 g/dL (ref 6.0–8.5)
eGFR: 43 mL/min/{1.73_m2} — ABNORMAL LOW (ref >59)

## 2022-01-02 LAB — CBC WITH DIFFERENTIAL/PLATELET
Basophils Absolute: 0.1 10*3/uL (ref 0.0–0.2)
Basos: 1 %
EOS (ABSOLUTE): 0.2 10*3/uL (ref 0.0–0.4)
Eos: 2 %
Hematocrit: 43 % (ref 34.0–46.6)
Hemoglobin: 14.4 g/dL (ref 11.1–15.9)
Immature Grans (Abs): 0 10*3/uL (ref 0.0–0.1)
Immature Granulocytes: 0 %
Lymphocytes Absolute: 4.7 10*3/uL — ABNORMAL HIGH (ref 0.7–3.1)
Lymphs: 43 %
MCH: 29 pg (ref 26.6–33.0)
MCHC: 33.5 g/dL (ref 31.5–35.7)
MCV: 87 fL (ref 79–97)
Monocytes Absolute: 0.6 10*3/uL (ref 0.1–0.9)
Monocytes: 6 %
Neutrophils Absolute: 5.3 10*3/uL (ref 1.4–7.0)
Neutrophils: 48 %
Platelets: 273 10*3/uL (ref 150–450)
RBC: 4.97 x10E6/uL (ref 3.77–5.28)
RDW: 13.1 % (ref 11.7–15.4)
WBC: 10.9 10*3/uL — ABNORMAL HIGH (ref 3.4–10.8)

## 2022-01-02 LAB — VITAMIN D 25 HYDROXY (VIT D DEFICIENCY, FRACTURES): Vit D, 25-Hydroxy: 32.8 ng/mL (ref 30.0–100.0)

## 2022-01-02 LAB — LIPID PANEL W/O CHOL/HDL RATIO
Cholesterol, Total: 293 mg/dL — ABNORMAL HIGH (ref 100–199)
HDL: 54 mg/dL (ref >39)
LDL Chol Calc (NIH): 184 mg/dL — ABNORMAL HIGH (ref 0–99)
Triglycerides: 285 mg/dL — ABNORMAL HIGH (ref 0–149)
VLDL Cholesterol Cal: 55 mg/dL — ABNORMAL HIGH (ref 5–40)

## 2022-01-02 LAB — T4, FREE: Free T4: 1.8 ng/dL — ABNORMAL HIGH (ref 0.82–1.77)

## 2022-01-02 LAB — TSH: TSH: 0.786 u[IU]/mL (ref 0.450–4.500)

## 2022-01-02 NOTE — Progress Notes (Signed)
Contacted via [place] -- lab only visit 6 weeks please:) Fasting. The 10-year ASCVD risk score (Arnett DK, et al., 2019) is: 4.2%   Values used to calculate the score:     Age: 61 years     Sex: Female     Is Non-Hispanic African American: No     Diabetic: No     Tobacco smoker: No     Systolic Blood Pressure: 937 mmHg     Is BP treated: No     HDL Cholesterol: 54 mg/dL     Total Cholesterol: 293 mg/dL   Good evening Laura Soto, your labs have returned: - CBC is showing very mild elevation in white blood cell count and lymphocytes.  Have you need sick recently?  I would like to recheck this on outpatient labs in 6 weeks please:) - Thyroid labs show normal TSH, but mild elevation Free T4 -- continue current Levothyroxine dosing but would like to recheck this in 6 weeks too to ensure not going more hyperthyroid.   - Kidney function continue to show mild kidney disease, continue Olmesartan at low dose and we will adjust as needed in future.  This is offering kidney protection. Liver function, AST and ALT, is normal. - Vitamin D level is normal. - Cholesterol labs have trended up this visit, when you come for labs only in 6 weeks please come fasting and we will recheck.  Focus heavily on diet changes at home.  Any questions? Keep being amazing!!  Thank you for allowing me to participate in your care.  I appreciate you. Kindest regards, Josiane Labine

## 2022-01-03 ENCOUNTER — Encounter: Payer: Self-pay | Admitting: Nurse Practitioner

## 2022-01-03 MED ORDER — FLUCONAZOLE 150 MG PO TABS: 150.0000 mg | ORAL_TABLET | Freq: Once | ORAL | 0 refills | Status: AC

## 2022-01-07 LAB — CYTOLOGY - PAP
Comment: NEGATIVE
Diagnosis: NEGATIVE
High risk HPV: NEGATIVE

## 2022-01-07 NOTE — Progress Notes (Signed)
Contacted via MyChart   Normal pap!!  Woohooo!!

## 2022-01-08 ENCOUNTER — Ambulatory Visit
Admission: RE | Admit: 2022-01-08 | Discharge: 2022-01-08 | Disposition: A | Discharge status: Home / Self Care | Payer: BC Managed Care – PPO | Source: Ambulatory Visit | Attending: Nurse Practitioner | Admitting: Nurse Practitioner

## 2022-01-08 DIAGNOSIS — Z1231 Encounter for screening mammogram for malignant neoplasm of breast: Secondary | ICD-10-CM | POA: Diagnosis present

## 2022-01-09 NOTE — Progress Notes (Signed)
Contacted via MyChart   Normal mammogram, may repeat in one year:)

## 2022-01-16 ENCOUNTER — Telehealth: Payer: Self-pay

## 2022-01-16 NOTE — Telephone Encounter (Signed)
Copied from [place] [id number]. Topic: Appointment Scheduling - Scheduling Inquiry for Clinic >> Jan 16, 2022 10:53 AM Tiffany B wrote: Patient received a text message from [phone] confirming her CFP for 01/21/2022 at 11 am. Patient would like a follow up call from practice admin to offer additional information regarding why she received this text if the system does not reflect.

## 2022-04-03 ENCOUNTER — Encounter: Payer: Self-pay | Admitting: Nurse Practitioner

## 2022-04-03 ENCOUNTER — Ambulatory Visit (INDEPENDENT_AMBULATORY_CARE_PROVIDER_SITE_OTHER): Payer: 59 | Admitting: Nurse Practitioner

## 2022-04-03 VITALS — BP 119/78 | HR 73 | Temp 98.8°F | Ht 64.0 in | Wt 146.5 lb

## 2022-04-03 DIAGNOSIS — F419 Anxiety disorder, unspecified: Secondary | ICD-10-CM | POA: Diagnosis not present

## 2022-04-03 DIAGNOSIS — R69 Illness, unspecified: Secondary | ICD-10-CM | POA: Diagnosis not present

## 2022-04-03 DIAGNOSIS — F5101 Primary insomnia: Secondary | ICD-10-CM | POA: Diagnosis not present

## 2022-04-03 DIAGNOSIS — F32A Depression, unspecified: Secondary | ICD-10-CM

## 2022-04-03 MED ORDER — BUSPIRONE HCL 15 MG PO TABS: 15.0000 mg | ORAL_TABLET | Freq: Three times a day (TID) | ORAL | 4 refills | Status: DC

## 2022-04-03 MED ORDER — TRAZODONE HCL 100 MG PO TABS: 100.0000 mg | ORAL_TABLET | Freq: Every day | ORAL | 4 refills | Status: DC

## 2022-04-03 NOTE — Progress Notes (Signed)
BP 119/78   Pulse 73   Temp 98.8 F (37.1 C) (Oral)   Ht _0  (1.626 m)   Wt 146 lb 8 oz (66.5 kg)   LMP  (LMP Unknown)   SpO2 98%   BMI 25.15 kg/m    Subjective:    Patient ID: Laura Soto, female    DOB: 01/02/1961, 61 y.o.   MRN: [id number]  HPI: Laura Soto is a 61 y.o. female  Chief Complaint  Patient presents with   Medication Dose Change    Patient is here to discuss possible medication dosage change. Patient says the current dosage she feels is not working. Patient is requesting a increase on her Buspar and Trazodone. Patient says she is not sleeping well.    DEPRESSION Would like medications adjusted as not sleeping well at night and more anxious recently.  Currently taking Buspar 10 MG BID and Trazodone 50 MG nightly.  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  Thyroid labs recently stable TSH and Free T4 mildly elevated. Continues on Levothyroxine daily without issue.  Feels this is well-controlled. Mood status: exacerbated Satisfied with current treatment?: yes Symptom severity: moderate  Duration of current treatment : chronic Side effects: no Medication compliance: good compliance Psychotherapy/counseling: no Previous psychiatric medications: multiple medications Depressed mood:  sometimes Anxious mood:  sometimes Anhedonia: no Significant weight loss or gain: no Insomnia: yes hard to fall asleep Fatigue:  at times if does not sleep well Feelings of worthlessness or guilt: no Impaired concentration/indecisiveness: no Suicidal ideations: no Hopelessness: no Crying spells: no    12/04/2021   10:52 AM 04/27/2021   11:17 AM 04/12/2021   11:36 AM 02/07/2021    3:38 PM 10/12/2020   11:11 AM  Depression screen PHQ 2/9  Decreased Interest 0 1 3 0 0  Down, Depressed, Hopeless 0 1 3 0 0  PHQ - 2 Score 0 2 6 0 0  Altered sleeping _1 0   Tired, decreased energy 0 1 3 0   Change in appetite 0 1 2 0   Feeling  bad or failure about yourself  0 1 2 0   Trouble concentrating 0 1 3 0   Moving slowly or fidgety/restless 0 1 3 0   Suicidal thoughts 0 1 0 0   PHQ-9 Score _2 0   Difficult doing work/chores Not difficult at all Somewhat difficult Very difficult Not difficult at all        12/04/2021   10:53 AM 04/27/2021   11:17 AM 04/12/2021   11:36 AM 02/07/2021    3:39 PM  GAD 7 : Generalized Anxiety Score  Nervous, Anxious, on Edge 0 1 3 0  Control/stop worrying _3 0  Worry too much - different things _4 0  Trouble relaxing 0 1 3 0  Restless 0 1 3 0  Easily annoyed or irritable 0 1 3 0  Afraid - awful might happen 0 1 2 0  Total GAD 7 Score _5 0  Anxiety Difficulty Not difficult at all Somewhat difficult Extremely difficult Not difficult at all   Relevant past medical, surgical, family and social history reviewed and updated as indicated. Interim medical history since our last visit reviewed. Allergies and medications reviewed and updated.  Review of Systems  Constitutional:  Negative for activity change, appetite change, diaphoresis, fatigue and fever.  Respiratory: Negative.    Cardiovascular: Negative.  Gastrointestinal: Negative.   Endocrine: Negative for cold intolerance and heat intolerance.  Neurological: Negative.   Psychiatric/Behavioral:  Positive for sleep disturbance. Negative for decreased concentration, self-injury and suicidal ideas. The patient is nervous/anxious.    Per HPI unless specifically indicated above     Objective:    BP 119/78   Pulse 73   Temp 98.8 F (37.1 C) (Oral)   Ht _0  (1.626 m)   Wt 146 lb 8 oz (66.5 kg)   LMP  (LMP Unknown)   SpO2 98%   BMI 25.15 kg/m   Wt Readings from Last 3 Encounters:  04/03/22 146 lb 8 oz (66.5 kg)  01/01/22 143 lb (64.9 kg)  12/04/21 143 lb 12.8 oz (65.2 kg)    Physical Exam Vitals and nursing note reviewed.  Constitutional:      General: She is awake. She is not in acute distress.     Appearance: She is well-developed and well-groomed. She is not ill-appearing.  HENT:     Head: Normocephalic.     Right Ear: Hearing normal.     Left Ear: Hearing normal.  Eyes:     General: Lids are normal.        Right eye: No discharge.        Left eye: No discharge.     Conjunctiva/sclera: Conjunctivae normal.     Pupils: Pupils are equal, round, and reactive to light.  Neck:     Thyroid: No thyromegaly.     Vascular: No carotid bruit.  Cardiovascular:     Rate and Rhythm: Normal rate and regular rhythm.     Heart sounds: Normal heart sounds. No murmur heard.    No gallop.  Pulmonary:     Effort: Pulmonary effort is normal. No accessory muscle usage or respiratory distress.     Breath sounds: Normal breath sounds.  Abdominal:     General: Bowel sounds are normal.     Palpations: Abdomen is soft.  Musculoskeletal:     Cervical back: Normal range of motion and neck supple.     Right lower leg: No edema.     Left lower leg: No edema.  Skin:    General: Skin is warm and dry.  Neurological:     Mental Status: She is alert and oriented to person, place, and time.     Deep Tendon Reflexes: Reflexes are normal and symmetric.     Reflex Scores:      Brachioradialis reflexes are 2+ on the right side and 2+ on the left side.      Patellar reflexes are 2+ on the right side and 2+ on the left side. Psychiatric:        Attention and Perception: Attention normal.        Mood and Affect: Mood normal.        Speech: Speech normal.        Behavior: Behavior normal. Behavior is cooperative.        Thought Content: Thought content normal.    Results for orders placed or performed in visit on 01/01/22  Microscopic Examination  Result Value Ref Range   WBC, UA 0-5 0 - 5 /hpf   RBC, Urine None seen 0 - 2 /hpf   Epithelial Cells (non renal) None seen 0 - 10 /hpf   Bacteria, UA None seen None seen/Few  WET PREP FOR TRICH, YEAST, CLUE   Urine  Result Value Ref Range   Trichomonas Exam  Negative Negative  Yeast Exam Negative Negative   Clue Cell Exam Negative Negative  CBC with Differential/Platelet  Result Value Ref Range   WBC 10.9 (H) 3.4 - 10.8 x10E3/uL   RBC 4.97 3.77 - 5.28 x10E6/uL   Hemoglobin 14.4 11.1 - 15.9 g/dL   Hematocrit 43.0 34.0 - 46.6 %   MCV 87 79 - 97 fL   MCH 29.0 26.6 - 33.0 pg   MCHC 33.5 31.5 - 35.7 g/dL   RDW 13.1 11.7 - 15.4 %   Platelets 273 150 - 450 x10E3/uL   Neutrophils 48 Not Estab. %   Lymphs 43 Not Estab. %   Monocytes 6 Not Estab. %   Eos 2 Not Estab. %   Basos 1 Not Estab. %   Neutrophils Absolute 5.3 1.4 - 7.0 x10E3/uL   Lymphocytes Absolute 4.7 (H) 0.7 - 3.1 x10E3/uL   Monocytes Absolute 0.6 0.1 - 0.9 x10E3/uL   EOS (ABSOLUTE) 0.2 0.0 - 0.4 x10E3/uL   Basophils Absolute 0.1 0.0 - 0.2 x10E3/uL   Immature Granulocytes 0 Not Estab. %   Immature Grans (Abs) 0.0 0.0 - 0.1 x10E3/uL  Comprehensive metabolic panel  Result Value Ref Range   Glucose 82 70 - 99 mg/dL   BUN 25 8 - 27 mg/dL   Creatinine, Ser 1.37 (H) 0.57 - 1.00 mg/dL   eGFR 43 (L) >59 mL/min/1.73   BUN/Creatinine Ratio 18 12 - 28   Sodium 140 134 - 144 mmol/L   Potassium 4.8 3.5 - 5.2 mmol/L   Chloride 103 96 - 106 mmol/L   CO2 16 (L) 20 - 29 mmol/L   Calcium 10.1 8.7 - 10.3 mg/dL   Total Protein 8.1 6.0 - 8.5 g/dL   Albumin 4.9 3.9 - 4.9 g/dL   Globulin, Total 3.2 1.5 - 4.5 g/dL   Albumin/Globulin Ratio 1.5 1.2 - 2.2   Bilirubin Total 0.2 0.0 - 1.2 mg/dL   Alkaline Phosphatase 108 44 - 121 IU/L   AST 22 0 - 40 IU/L   ALT 17 0 - 32 IU/L  Lipid Panel w/o Chol/HDL Ratio  Result Value Ref Range   Cholesterol, Total 293 (H) 100 - 199 mg/dL   Triglycerides 285 (H) 0 - 149 mg/dL   HDL 54 >39 mg/dL   VLDL Cholesterol Cal 55 (H) 5 - 40 mg/dL   LDL Chol Calc (NIH) 184 (H) 0 - 99 mg/dL  TSH  Result Value Ref Range   TSH 0.786 0.450 - 4.500 uIU/mL  VITAMIN D 25 Hydroxy (Vit-D Deficiency, Fractures)  Result Value Ref Range   Vit D, 25-Hydroxy 32.8 30.0 - 100.0  ng/mL  T4, free  Result Value Ref Range   Free T4 1.80 (H) 0.82 - 1.77 ng/dL  Urinalysis, Routine w reflex microscopic  Result Value Ref Range   Specific Gravity, UA 1.020 1.005 - 1.030   pH, UA 5.5 5.0 - 7.5   Color, UA Yellow Yellow   Appearance Ur Clear Clear   Leukocytes,UA Trace (A) Negative   Protein,UA Negative Negative/Trace   Glucose, UA Negative Negative   Ketones, UA Negative Negative   RBC, UA Negative Negative   Bilirubin, UA Negative Negative   Urobilinogen, Ur 0.2 0.2 - 1.0 mg/dL   Nitrite, UA Negative Negative   Microscopic Examination See below:   Cytology - PAP  Result Value Ref Range   High risk HPV Negative    Adequacy      Satisfactory for evaluation; transformation zone component PRESENT.   Diagnosis      -  Negative for intraepithelial lesion or malignancy (NILM)   Comment Normal Reference Range HPV - Negative       Assessment & Plan:   Problem List Items Addressed This Visit       Other   Anxiety and depression    Chronic, exacerbated.  Denies SI/HI.  Will increase Buspar to 15 MG TID, she can start with BID discussed with her, and increase to TID dosing if needed.  She has tolerated Buspar well and found benefit from it.      Relevant Medications   traZODone (DESYREL) 100 MG tablet   busPIRone (BUSPAR) 15 MG tablet   Insomnia - Primary    Chronic, ongoing.  Will increase Trazodone to 100 MG as needed at night, educated her on this medication use and side effects.  She tolerates well at baseline, feels needs a little increase. ?Maintain a regular sleep schedule, particularly a regular wake-up time in the morning ?Try not to force sleep ?Avoid caffeinated beverages after lunch ?Avoid alcohol near bedtime (eg, late afternoon and evening)  ?Avoid smoking or other nicotine intake, particularly during the evening ?Adjust the bedroom environment as needed to decrease stimuli (eg, reduce ambient light, turn off the television or radio) ?Avoid  prolonged use of light-emitting screens (laptops, tablets, smartphones, ebooks) before bedtime  ?Resolve concerns or worries before bedtime ?Exercise regularly for at least 20 minutes, preferably more than four to five hours prior to bedtime  ?Avoid daytime naps, especially if they are longer than 20 to 30 minutes or occur late in the day         Follow up plan: Return for as scheduled 07/04/21.

## 2022-04-03 NOTE — Assessment & Plan Note (Signed)
Chronic, exacerbated.  Denies SI/HI.  Will increase Buspar to 15 MG TID, she can start with BID discussed with her, and increase to TID dosing if needed.  She has tolerated Buspar well and found benefit from it.

## 2022-04-03 NOTE — Patient Instructions (Signed)

## 2022-04-03 NOTE — Assessment & Plan Note (Signed)
Chronic, ongoing.  Will increase Trazodone to 100 MG as needed at night, educated her on this medication use and side effects.  She tolerates well at baseline, feels needs a little increase. ?Maintain a regular sleep schedule, particularly a regular wake-up time in the morning ?Try not to force sleep ?Avoid caffeinated beverages after lunch ?Avoid alcohol near bedtime (eg, late afternoon and evening)  ?Avoid smoking or other nicotine intake, particularly during the evening ?Adjust the bedroom environment as needed to decrease stimuli (eg, reduce ambient light, turn off the television or radio) ?Avoid prolonged use of light-emitting screens (laptops, tablets, smartphones, ebooks) before bedtime  ?Resolve concerns or worries before bedtime ?Exercise regularly for at least 20 minutes, preferably more than four to five hours prior to bedtime  ?Avoid daytime naps, especially if they are longer than 20 to 30 minutes or occur late in the day

## 2022-04-24 ENCOUNTER — Ambulatory Visit: Payer: Self-pay | Admitting: *Deleted

## 2022-04-24 NOTE — Telephone Encounter (Signed)
Called patient to discuss a message received .  No answer. HIPAA compliant vm left requesting return call.  [place] for nurse triage to give results if patient calls back.

## 2022-04-24 NOTE — Telephone Encounter (Signed)
Summary: discuss anxiety   Pt called in for assistance. Pt says that she is currently taking busPIRone (BUSPAR) 15 MG tablet. Pt says that she feels that this medication help some with her anxiety but doesn't last long. Pt says that she would like to discuss changing her anxiety medication.      Chief Complaint: requesting another medication for anxiety Symptoms: headache, night sweats, feels more anxious and irritable taking full dose. Reports taking 1/2 dose 15 mg this am and does not feel as anxious. Patient reports she has not take medication TID as recommended in last OV note. Feels it is too much. Frequency: 3 days  Pertinent Negatives: Patient denies worsening anxiety  Disposition: '[]'$ ED /'[]'$ Urgent Care (no appt availability in office) / '[]'$ Appointment(In office/virtual)/ '[]'$  [facility]/ '[]'$ Home Care/ '[]'$ Refused Recommended Disposition /'[]'$ [facility]/ '[x]'$  Follow-up with PCP Additional Notes:   Please advise if another medication recommended. Please advise if another appt needed. No appt until 05/07/22.    Reason for Disposition  [1] Caller has NON-URGENT medicine question about med that PCP prescribed AND [2] triager unable to answer question  Answer Assessment - Initial Assessment Questions 1. NAME of MEDICINE: "What medicine(s) are you calling about?"     Buspar 15 mg  2. QUESTION: "What is your question?" (e.g., double dose of medicine, side effect)   Is there another medication that may be better for symptoms due to making patient feel more anxious when taking full dose , night sweats, headaches? 3. PRESCRIBER: "Who prescribed the medicine?" Reason: if prescribed by specialist, call should be referred to that group.     PCP 4. SYMPTOMS: "Do you have any symptoms?" If Yes, ask: "What symptoms are you having?"  "How bad are the symptoms (e.g., mild, moderate, severe)     Feels like making more anxious, night sweats, headaches, more irritable  5. PREGNANCY:  "Is  there any chance that you are pregnant?" "When was your last menstrual period?"     na  Protocols used: Medication Question Call-A-AH

## 2022-04-24 NOTE — Telephone Encounter (Signed)
Spoke with patient and she was given appt tomorrow at 10 am [employer]

## 2022-04-25 ENCOUNTER — Ambulatory Visit (INDEPENDENT_AMBULATORY_CARE_PROVIDER_SITE_OTHER): Payer: 59 | Admitting: Physician Assistant

## 2022-04-25 ENCOUNTER — Encounter: Payer: Self-pay | Admitting: Physician Assistant

## 2022-04-25 VITALS — BP 122/83 | HR 67 | Temp 98.7°F | Wt 146.9 lb

## 2022-04-25 DIAGNOSIS — F32A Depression, unspecified: Secondary | ICD-10-CM | POA: Diagnosis not present

## 2022-04-25 DIAGNOSIS — F419 Anxiety disorder, unspecified: Secondary | ICD-10-CM | POA: Diagnosis not present

## 2022-04-25 DIAGNOSIS — R69 Illness, unspecified: Secondary | ICD-10-CM | POA: Diagnosis not present

## 2022-04-25 MED ORDER — FLUOXETINE HCL 10 MG PO TABS: 10.0000 mg | ORAL_TABLET | Freq: Every day | ORAL | 1 refills | Status: DC

## 2022-04-25 NOTE — Progress Notes (Signed)
Established Patient Office Visit  Name: Laura Soto   MRN: [id number]    DOB: Feb 23, 1961   Date:04/25/2022  Today's Provider: Talitha Givens, MHS, PA-C Introduced myself to the patient as a PA-C and provided education on APPs in clinical practice.         Subjective  Chief Complaint  Chief Complaint  Patient presents with   Anxiety    Patient states she has been taking buspar for years and recently started to feel like it was starting to cause side effects. States she took half of a pill yesterday, and did not take any today.     Anxiety Symptoms include nervous/anxious behavior. Patient reports no suicidal ideas.     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 Reports her daughter takes fluoxetine and has good results with this   Reports she was taking Buspar BID  Yesterday only took a half tablet and then today did not take at all      04/25/2022   10:05 AM 12/04/2021   10:52 AM 04/27/2021   11:17 AM 04/12/2021   11:36 AM 02/07/2021    3:38 PM  Depression screen PHQ 2/9  Decreased Interest 2 0 1 3 0  Down, Depressed, Hopeless 2 0 1 3 0  PHQ - 2 Score 4 0 2 6 0  Altered sleeping '1 3 1 3 '$ 0  Tired, decreased energy 2 0 1 3 0  Change in appetite 0 0 1 2 0  Feeling bad or failure about yourself  0 0 1 2 0  Trouble concentrating 1 0 1 3 0  Moving slowly or fidgety/restless 0 0 1 3 0  Suicidal thoughts 0 0 1 0 0  PHQ-9 Score '8 3 9 22 '$ 0  Difficult doing work/chores Somewhat difficult Not difficult at all Somewhat difficult Very  difficult Not difficult at all         04/25/2022   10:05 AM 12/04/2021   10:53 AM 04/27/2021   11:17 AM 04/12/2021   11:36 AM  GAD 7 : Generalized Anxiety Score  Nervous, Anxious, on Edge 2 0 1 3  Control/stop worrying '2 3 1 3  '$ Worry too much - different things '2 3 1 3  '$ Trouble relaxing 2 0 1 3  Restless 2 0 1 3  Easily annoyed or irritable 2 0 1 3  Afraid - awful might happen 1 0 1 2  Total GAD 7 Score '13 6 7 20  '$ Anxiety Difficulty Somewhat difficult Not difficult at all Somewhat difficult Extremely difficult        Patient Active Problem List   Diagnosis Date Noted   Elevated low density lipoprotein (LDL) cholesterol level 01/02/2022   Insomnia 12/04/2021   Vaginal atrophy 07/28/2019   CKD (chronic kidney disease) stage 3, GFR 30-59 ml/min (HCC) 02/26/2019   Acute right-sided low back pain without sciatica 10/22/2018   GERD (gastroesophageal reflux disease) 06/13/2017   Anxiety and depression 03/06/2016  Allergic rhinitis 03/06/2016   Hashimoto's thyroiditis 12/26/2015   Personal history of tobacco use, presenting hazards to health     Past Surgical History:  Procedure Laterality Date   COLONOSCOPY WITH PROPOFOL N/A 06/25/2017   Procedure: COLONOSCOPY WITH PROPOFOL;  Surgeon: Jonathon Bellows, MD;  Location: [facility] END[facility]COPY;  Service: Gastroenterology;  Laterality: N/A;   ESOPHAGOGASTRODUODENOSCOPY (EGD) WITH PROPOFOL N/A 06/25/2017   Procedure: ESOPHAGOGASTRODUODENOSCOPY (EGD) WITH PROPOFOL;  Surgeon: Jonathon Bellows, MD;  Location: [facility] ENDOSCOPY;  Service: Gastroenterology;  Laterality: N/A;   WISDOM TOOTH EXTRACTION      Family History  Problem Relation Age of Onset   Liver cancer Father        age 21   Diabetes Father    Stroke Mother    Atrial fibrillation Mother     Social History   Tobacco Use   Smoking status: Former    Packs/day: 0.25    Years: 20.00    Total pack years: 5.00    Types: Cigarettes    Quit date: 12/26/2014    Years since quitting: 7.3    Smokeless tobacco: Never  Substance Use Topics   Alcohol use: Yes    Comment: occasionally - wine. none last 24hrs     Current Outpatient Medications:    estradiol (ESTRACE VAGINAL) 0.1 MG/GM vaginal cream, Place 1 Applicatorful vaginally at bedtime. Apply one application vaginally daily at bedtime for 2 weeks and then reduce to one application vaginally at bedtime twice weekly., Disp: 42.5 g, Rfl: 8   FLUoxetine (PROZAC) 10 MG tablet, Take 1 tablet (10 mg total) by mouth daily., Disp: 30 tablet, Rfl: 1   levothyroxine (SYNTHROID) 112 MCG tablet, Take 1 tablet (112 mcg total) by mouth daily before breakfast., Disp: 90 tablet, Rfl: 4   traZODone (DESYREL) 100 MG tablet, Take 1 tablet (100 mg total) by mouth at bedtime., Disp: 90 tablet, Rfl: 4  No Known Allergies  I personally reviewed active problem list, medication list, allergies, notes from last encounter with the patient/caregiver today.   Review of Systems  Psychiatric/Behavioral:  Negative for memory loss and suicidal ideas. The patient is nervous/anxious.       Objective  Vitals:   04/25/22 1001  BP: 122/83  Pulse: 67  Temp: 98.7 F (37.1 C)  Weight: 146 lb 14.4 oz (66.6 kg)    Body mass index is 25.22 kg/m.  Physical Exam Vitals reviewed.  Constitutional:      General: She is awake.     Appearance: Normal appearance. She is well-developed and well-groomed.  HENT:     Head: Normocephalic and atraumatic.  Pulmonary:     Effort: Pulmonary effort is normal.  Musculoskeletal:     Cervical back: Normal range of motion.  Neurological:     General: No focal deficit present.     Mental Status: She is alert and oriented to person, place, and time.     GCS: GCS eye subscore is 4. GCS verbal subscore is 5. GCS motor subscore is 6.     Cranial Nerves: Cranial nerves 2-12 are intact. No dysarthria or facial asymmetry.  Psychiatric:        Attention and Perception: Attention and perception normal.        Mood and  Affect: Mood and affect normal.        Speech: Speech normal.        Behavior: Behavior normal. Behavior is cooperative.        Thought Content: Thought content normal.  Cognition and Memory: Cognition and memory normal.        Judgment: Judgment normal.      No results found for this or any previous visit (from the past 2160 hour(s)).   PHQ2/9:    04/25/2022   10:05 AM 12/04/2021   10:52 AM 04/27/2021   11:17 AM 04/12/2021   11:36 AM 02/07/2021    3:38 PM  Depression screen PHQ 2/9  Decreased Interest 2 0 1 3 0  Down, Depressed, Hopeless 2 0 1 3 0  PHQ - 2 Score 4 0 2 6 0  Altered sleeping '1 3 1 3 '$ 0  Tired, decreased energy 2 0 1 3 0  Change in appetite 0 0 1 2 0  Feeling bad or failure about yourself  0 0 1 2 0  Trouble concentrating 1 0 1 3 0  Moving slowly or fidgety/restless 0 0 1 3 0  Suicidal thoughts 0 0 1 0 0  PHQ-9 Score '8 3 9 22 '$ 0  Difficult doing work/chores Somewhat difficult Not difficult at all Somewhat difficult Very difficult Not difficult at all      Fall Risk:    01/01/2022   11:50 AM 12/04/2021   10:50 AM 06/30/2020    2:52 PM 10/22/2018    8:28 AM 08/16/2016    3:08 PM  [place] in the past year? 0 0 0 0 No  Number falls in past yr: 0 0  0   Injury with Fall? 0 0  0   Risk for fall due to : No Fall Risks No Fall Risks     Follow up Falls evaluation completed Falls evaluation completed         Functional Status Survey:      Assessment & Plan  Problem List Items Addressed This Visit       Other   Anxiety and depression - Primary    Chronic, ongoing Reports limited relief from symptoms and anxiety seems to be getting worse with recurrent use of Buspar throughout the day She is amenable to switch to Fluoxetine today as she does not wish to continue with Buspar anymore Will send script for Fluoxetine 10 mg po QD for her to start tomorrow Follow up in 4-6 weeks to assess response or sooner if concerns arise      Relevant  Medications   FLUoxetine (PROZAC) 10 MG tablet     Return in about 6 weeks (around 06/06/2022) for anxiety - virtual okay .   I, Mazin Emma E Ahlayah Tarkowski, PA-C, have reviewed all documentation for this visit. The documentation on 04/25/22 for the exam, diagnosis, procedures, and orders are all accurate and complete.   Talitha Givens, MHS, PA-C [facility]

## 2022-04-25 NOTE — Patient Instructions (Addendum)
Please stop the Buspar at this time  You can start the Fluoxetine 10 mg  by mouth once per day tomorrow  Please let us know if you start having side effects before your follow up    It was nice to meet you and I appreciate the opportunity to be involved in your care If you were satisfied with the care you received from me, I would greatly appreciate you saying so in the after-visit survey that is sent out following our visit.

## 2022-04-25 NOTE — Assessment & Plan Note (Signed)
Chronic, ongoing Reports limited relief from symptoms and anxiety seems to be getting worse with recurrent use of Buspar throughout the day She is amenable to switch to Fluoxetine today as she does not wish to continue with Buspar anymore Will send script for Fluoxetine 10 mg po QD for her to start tomorrow Follow up in 4-6 weeks to assess response or sooner if concerns arise

## 2022-06-04 NOTE — Patient Instructions (Signed)

## 2022-06-06 ENCOUNTER — Encounter: Payer: Self-pay | Admitting: Nurse Practitioner

## 2022-06-06 ENCOUNTER — Telehealth (INDEPENDENT_AMBULATORY_CARE_PROVIDER_SITE_OTHER): Payer: 59 | Admitting: Nurse Practitioner

## 2022-06-06 DIAGNOSIS — F419 Anxiety disorder, unspecified: Secondary | ICD-10-CM

## 2022-06-06 DIAGNOSIS — F418 Other specified anxiety disorders: Secondary | ICD-10-CM

## 2022-06-06 MED ORDER — LEVOTHYROXINE SODIUM 112 MCG PO TABS: 112.0000 ug | ORAL_TABLET | Freq: Every day | ORAL | 4 refills | Status: DC

## 2022-06-06 MED ORDER — FLUOXETINE HCL 10 MG PO TABS: 10.0000 mg | ORAL_TABLET | Freq: Every day | ORAL | 4 refills | Status: DC

## 2022-06-06 NOTE — Progress Notes (Signed)
LMP  (LMP Unknown)    Subjective:    Patient ID: Laura Soto, female    DOB: July 14, 1960, 62 y.o.   MRN: [id number]  HPI: Laura Soto is a 62 y.o. female  Chief Complaint  Patient presents with   Anxiety   This visit was completed via telephone due to the restrictions of the COVID-19 pandemic. All issues as above were discussed and addressed but no physical exam was performed. If it was felt that the patient should be evaluated in the office, they were directed there. The patient verbally consented to this visit. Patient was unable to complete an audio/visual visit due to Technical difficulties", "Lack of internet. Due to the catastrophic nature of the COVID-19 pandemic, this visit was done through audio contact only. Location of the patient: home Location of the provider: work Those involved with this call:  Provider: Marnee Guarneri, DNP CMA: Frazier Butt, CMA Front Desk/Registration: [employer]  Time spent on call:  21 minutes on the phone discussing health concerns. 15 minutes total spent in review of patient's record and preparation of their chart.  I verified patient identity using two factors (patient name and date of birth). Patient consents verbally to being seen via telemedicine visit today.    ANXIETY Was seen on 04/25/22 and was started on Prozac + Buspar was stopped.  The Buspar stopped working.  Prozac is offering benefit to mood and no further night sweats.   Mood status: better Satisfied with current treatment?: yes Symptom severity: mild  Duration of current treatment : chronic Side effects: no Medication compliance: good compliance Psychotherapy/counseling: none Previous psychiatric medications: Buspar, Celexa Depressed mood: no Anxious mood: no Anhedonia: no Significant weight loss or gain: no Insomnia: none Fatigue: no Feelings of worthlessness or guilt: no Impaired concentration/indecisiveness: no Suicidal ideations: no Hopelessness: no Crying  spells: no    06/06/2022   10:59 AM 04/25/2022   10:05 AM 12/04/2021   10:52 AM 04/27/2021   11:17 AM 04/12/2021   11:36 AM  Depression screen PHQ 2/9  Decreased Interest 0 2 0 1 3  Down, Depressed, Hopeless 0 2 0 1 3  PHQ - 2 Score 0 4 0 2 6  Altered sleeping 0 '1 3 1 3  '$ Tired, decreased energy 0 2 0 1 3  Change in appetite 0 0 0 1 2  Feeling bad or failure about yourself  0 0 0 1 2  Trouble concentrating 0 1 0 1 3  Moving slowly or fidgety/restless 0 0 0 1 3  Suicidal thoughts 0 0 0 1 0  PHQ-9 Score 0 '8 3 9 22  '$ Difficult doing work/chores Not difficult at all Somewhat difficult Not difficult at all Somewhat difficult Very difficult       06/06/2022   11:00 AM 04/25/2022   10:05 AM 12/04/2021   10:53 AM 04/27/2021   11:17 AM  GAD 7 : Generalized Anxiety Score  Nervous, Anxious, on Edge 0 2 0 1  Control/stop worrying 0 '2 3 1  '$ Worry too much - different things 0 '2 3 1  '$ Trouble relaxing 0 2 0 1  Restless 0 2 0 1  Easily annoyed or irritable 1 2 0 1  Afraid - awful might happen 0 1 0 1  Total GAD 7 Score '1 13 6 7  '$ Anxiety Difficulty Not difficult at all Somewhat difficult Not difficult at all Somewhat difficult      Relevant past medical, surgical, family and social history reviewed  and updated as indicated. Interim medical history since our last visit reviewed. Allergies and medications reviewed and updated.  Review of Systems  Constitutional:  Negative for activity change, appetite change, diaphoresis, fatigue and fever.  Respiratory:  Negative for cough, chest tightness and shortness of breath.   Cardiovascular:  Negative for chest pain, palpitations and leg swelling.  Neurological: Negative.   Psychiatric/Behavioral: Negative.      Per HPI unless specifically indicated above     Objective:    LMP  (LMP Unknown)   Wt Readings from Last 3 Encounters:  04/25/22 146 lb 14.4 oz (66.6 kg)  04/03/22 146 lb 8 oz (66.5 kg)  01/01/22 143 lb (64.9 kg)    Physical  Exam  Unable to obtain due to technical difficulty on patient end.  Results for orders placed or performed in visit on 01/01/22  Microscopic Examination  Result Value Ref Range   WBC, UA 0-5 0 - 5 /hpf   RBC, Urine None seen 0 - 2 /hpf   Epithelial Cells (non renal) None seen 0 - 10 /hpf   Bacteria, UA None seen None seen/Few  WET PREP FOR TRICH, YEAST, CLUE   Urine  Result Value Ref Range   Trichomonas Exam Negative Negative   Yeast Exam Negative Negative   Clue Cell Exam Negative Negative  CBC with Differential/Platelet  Result Value Ref Range   WBC 10.9 (H) 3.4 - 10.8 x10E3/uL   RBC 4.97 3.77 - 5.28 x10E6/uL   Hemoglobin 14.4 11.1 - 15.9 g/dL   Hematocrit 43.0 34.0 - 46.6 %   MCV 87 79 - 97 fL   MCH 29.0 26.6 - 33.0 pg   MCHC 33.5 31.5 - 35.7 g/dL   RDW 13.1 11.7 - 15.4 %   Platelets 273 150 - 450 x10E3/uL   Neutrophils 48 Not Estab. %   Lymphs 43 Not Estab. %   Monocytes 6 Not Estab. %   Eos 2 Not Estab. %   Basos 1 Not Estab. %   Neutrophils Absolute 5.3 1.4 - 7.0 x10E3/uL   Lymphocytes Absolute 4.7 (H) 0.7 - 3.1 x10E3/uL   Monocytes Absolute 0.6 0.1 - 0.9 x10E3/uL   EOS (ABSOLUTE) 0.2 0.0 - 0.4 x10E3/uL   Basophils Absolute 0.1 0.0 - 0.2 x10E3/uL   Immature Granulocytes 0 Not Estab. %   Immature Grans (Abs) 0.0 0.0 - 0.1 x10E3/uL  Comprehensive metabolic panel  Result Value Ref Range   Glucose 82 70 - 99 mg/dL   BUN 25 8 - 27 mg/dL   Creatinine, Ser 1.37 (H) 0.57 - 1.00 mg/dL   eGFR 43 (L) >59 mL/min/1.73   BUN/Creatinine Ratio 18 12 - 28   Sodium 140 134 - 144 mmol/L   Potassium 4.8 3.5 - 5.2 mmol/L   Chloride 103 96 - 106 mmol/L   CO2 16 (L) 20 - 29 mmol/L   Calcium 10.1 8.7 - 10.3 mg/dL   Total Protein 8.1 6.0 - 8.5 g/dL   Albumin 4.9 3.9 - 4.9 g/dL   Globulin, Total 3.2 1.5 - 4.5 g/dL   Albumin/Globulin Ratio 1.5 1.2 - 2.2   Bilirubin Total 0.2 0.0 - 1.2 mg/dL   Alkaline Phosphatase 108 44 - 121 IU/L   AST 22 0 - 40 IU/L   ALT 17 0 - 32 IU/L   Lipid Panel w/o Chol/HDL Ratio  Result Value Ref Range   Cholesterol, Total 293 (H) 100 - 199 mg/dL   Triglycerides 285 (H) 0 - 149 mg/dL  HDL 54 >39 mg/dL   VLDL Cholesterol Cal 55 (H) 5 - 40 mg/dL   LDL Chol Calc (NIH) 184 (H) 0 - 99 mg/dL  TSH  Result Value Ref Range   TSH 0.786 0.450 - 4.500 uIU/mL  VITAMIN D 25 Hydroxy (Vit-D Deficiency, Fractures)  Result Value Ref Range   Vit D, 25-Hydroxy 32.8 30.0 - 100.0 ng/mL  T4, free  Result Value Ref Range   Free T4 1.80 (H) 0.82 - 1.77 ng/dL  Urinalysis, Routine w reflex microscopic  Result Value Ref Range   Specific Gravity, UA 1.020 1.005 - 1.030   pH, UA 5.5 5.0 - 7.5   Color, UA Yellow Yellow   Appearance Ur Clear Clear   Leukocytes,UA Trace (A) Negative   Protein,UA Negative Negative/Trace   Glucose, UA Negative Negative   Ketones, UA Negative Negative   RBC, UA Negative Negative   Bilirubin, UA Negative Negative   Urobilinogen, Ur 0.2 0.2 - 1.0 mg/dL   Nitrite, UA Negative Negative   Microscopic Examination See below:   Cytology - PAP  Result Value Ref Range   High risk HPV Negative    Adequacy      Satisfactory for evaluation; transformation zone component PRESENT.   Diagnosis      - Negative for intraepithelial lesion or malignancy (NILM)   Comment Normal Reference Range HPV - Negative       Assessment & Plan:   Problem List Items Addressed This Visit       Other   Anxiety and depression    Chronic, ongoing.  Denies SI/HI.  Improved mood at this time. Continue Prozac at current dosing and adjust as needed.  PHQ and GAD scores much improved.  Refills sent.  Return in 6 months.      Relevant Medications   FLUoxetine (PROZAC) 10 MG tablet    I discussed the assessment and treatment plan with the patient. The patient was provided an opportunity to ask questions and all were answered. The patient agreed with the plan and demonstrated an understanding of the instructions.   The patient was advised to call  back or seek an in-person evaluation if the symptoms worsen or if the condition fails to improve as anticipated.   I provided 21+ minutes of time during this encounter.    Follow up plan: Return in about 6 months (around 12/05/2022) for MOOD, THYROID, CKD, GERD (physical labs).

## 2022-06-06 NOTE — Assessment & Plan Note (Signed)
Chronic, ongoing.  Denies SI/HI.  Improved mood at this time. Continue Prozac at current dosing and adjust as needed.  PHQ and GAD scores much improved.  Refills sent.  Return in 6 months.

## 2022-07-04 ENCOUNTER — Ambulatory Visit: Payer: BC Managed Care – PPO | Admitting: Nurse Practitioner

## 2022-09-10 ENCOUNTER — Telehealth: Payer: Self-pay | Admitting: Nurse Practitioner

## 2022-09-10 NOTE — Telephone Encounter (Signed)
Copied from [id number]. Topic: General - Other >> Sep 10, 2022 10:08 AM Clide Dales wrote: Patient would like a call back from Destiny. Patient would not tell me what the call was about.

## 2022-09-10 NOTE — Progress Notes (Unsigned)
LMP  (LMP Unknown)    Subjective:    Patient ID: Laura Soto, female    DOB: 1961/03/22, 62 y.o.   MRN: [id number]  HPI: Laura Soto is a 62 y.o. female  No chief complaint on file.  ANXIETY Was seen on 04/25/22 and was started on Prozac + Buspar was stopped.  The Buspar stopped working.  Prozac is offering benefit to mood and no further night sweats.   Mood status: better Satisfied with current treatment?: yes Symptom severity: mild  Duration of current treatment : chronic Side effects: no Medication compliance: good compliance Psychotherapy/counseling: none Previous psychiatric medications: Buspar, Celexa Depressed mood: no Anxious mood: no Anhedonia: no Significant weight loss or gain: no Insomnia: none Fatigue: no Feelings of worthlessness or guilt: no Impaired concentration/indecisiveness: no Suicidal ideations: no Hopelessness: no Crying spells: no    06/06/2022   10:59 AM 04/25/2022   10:05 AM 12/04/2021   10:52 AM 04/27/2021   11:17 AM 04/12/2021   11:36 AM  Depression screen PHQ 2/9  Decreased Interest 0 2 0 1 3  Down, Depressed, Hopeless 0 2 0 1 3  PHQ - 2 Score 0 4 0 2 6  Altered sleeping 0 Tired, decreased energy 0 2 0 1 3  Change in appetite 0 0 0 1 2  Feeling bad or failure about yourself  0 0 0 1 2  Trouble concentrating 0 1 0 1 3  Moving slowly or fidgety/restless 0 0 0 1 3  Suicidal thoughts 0 0 0 1 0  PHQ-9 Score 0 Difficult doing work/chores Not difficult at all Somewhat difficult Not difficult at all Somewhat difficult Very difficult       06/06/2022   11:00 AM 04/25/2022   10:05 AM 12/04/2021   10:53 AM 04/27/2021   11:17 AM  GAD 7 : Generalized Anxiety Score  Nervous, Anxious, on Edge 0 2 0 1  Control/stop worrying 0 Worry too much - different things 0 Trouble relaxing 0 2 0 1  Restless 0 2 0 1  Easily annoyed or irritable 1 2 0 1  Afraid - awful might happen 0 1 0 1  Total GAD 7 Score Anxiety Difficulty Not difficult at all Somewhat difficult Not difficult at all Somewhat difficult   Relevant past medical, surgical, family and social history reviewed and updated as indicated. Interim medical history since our last visit reviewed. Allergies and medications reviewed and updated.  Review of Systems  Per HPI unless specifically indicated above     Objective:    LMP  (LMP Unknown)   Wt Readings from Last 3 Encounters:  04/25/22 146 lb 14.4 oz (66.6 kg)  04/03/22 146 lb 8 oz (66.5 kg)  01/01/22 143 lb (64.9 kg)    Physical Exam  Results for orders placed or performed in visit on 01/01/22  Microscopic Examination  Result Value Ref Range   WBC, UA 0-5 0 - 5 /hpf   RBC, Urine None seen 0 - 2 /hpf   Epithelial Cells (non renal) None seen 0 - 10 /hpf   Bacteria, UA None seen None seen/Few  WET PREP FOR TRICH, YEAST, CLUE   Urine  Result Value Ref Range   Trichomonas Exam Negative Negative   Yeast Exam Negative Negative   Clue Cell Exam Negative Negative  CBC with Differential/Platelet  Result Value  Ref Range   WBC 10.9 (H) 3.4 - 10.8 x10E3/uL   RBC 4.97 3.77 - 5.28 x10E6/uL   Hemoglobin 14.4 11.1 - 15.9 g/dL   Hematocrit 16.1 09.6 - 46.6 %   MCV 87 79 - 97 fL   MCH 29.0 26.6 - 33.0 pg   MCHC 33.5 31.5 - 35.7 g/dL   RDW 04.5 40.9 - 81.1 %   Platelets 273 150 - 450 x10E3/uL   Neutrophils 48 Not Estab. %   Lymphs 43 Not Estab. %   Monocytes 6 Not Estab. %   Eos 2 Not Estab. %   Basos 1 Not Estab. %   Neutrophils Absolute 5.3 1.4 - 7.0 x10E3/uL   Lymphocytes Absolute 4.7 (H) 0.7 - 3.1 x10E3/uL   Monocytes Absolute 0.6 0.1 - 0.9 x10E3/uL   EOS (ABSOLUTE) 0.2 0.0 - 0.4 x10E3/uL   Basophils Absolute 0.1 0.0 - 0.2 x10E3/uL   Immature Granulocytes 0 Not Estab. %   Immature Grans (Abs) 0.0 0.0 - 0.1 x10E3/uL  Comprehensive metabolic panel  Result Value Ref Range   Glucose 82 70 - 99 mg/dL   BUN 25 8 - 27 mg/dL   Creatinine, Ser 9.14 (H) 0.57 - 1.00 mg/dL    eGFR 43 (L) >78 GN/FAO/1.30   BUN/Creatinine Ratio 18 12 - 28   Sodium 140 134 - 144 mmol/L   Potassium 4.8 3.5 - 5.2 mmol/L   Chloride 103 96 - 106 mmol/L   CO2 16 (L) 20 - 29 mmol/L   Calcium 10.1 8.7 - 10.3 mg/dL   Total Protein 8.1 6.0 - 8.5 g/dL   Albumin 4.9 3.9 - 4.9 g/dL   Globulin, Total 3.2 1.5 - 4.5 g/dL   Albumin/Globulin Ratio 1.5 1.2 - 2.2   Bilirubin Total 0.2 0.0 - 1.2 mg/dL   Alkaline Phosphatase 108 44 - 121 IU/L   AST 22 0 - 40 IU/L   ALT 17 0 - 32 IU/L  Lipid Panel w/o Chol/HDL Ratio  Result Value Ref Range   Cholesterol, Total 293 (H) 100 - 199 mg/dL   Triglycerides 865 (H) 0 - 149 mg/dL   HDL 54 >78 mg/dL   VLDL Cholesterol Cal 55 (H) 5 - 40 mg/dL   LDL Chol Calc (NIH) 469 (H) 0 - 99 mg/dL  TSH  Result Value Ref Range   TSH 0.786 0.450 - 4.500 uIU/mL  VITAMIN D 25 Hydroxy (Vit-D Deficiency, Fractures)  Result Value Ref Range   Vit D, 25-Hydroxy 32.8 30.0 - 100.0 ng/mL  T4, free  Result Value Ref Range   Free T4 1.80 (H) 0.82 - 1.77 ng/dL  Urinalysis, Routine w reflex microscopic  Result Value Ref Range   Specific Gravity, UA 1.020 1.005 - 1.030   pH, UA 5.5 5.0 - 7.5   Color, UA Yellow Yellow   Appearance Ur Clear Clear   Leukocytes,UA Trace (A) Negative   Protein,UA Negative Negative/Trace   Glucose, UA Negative Negative   Ketones, UA Negative Negative   RBC, UA Negative Negative   Bilirubin, UA Negative Negative   Urobilinogen, Ur 0.2 0.2 - 1.0 mg/dL   Nitrite, UA Negative Negative   Microscopic Examination See below:   Cytology - PAP  Result Value Ref Range   High risk HPV Negative    Adequacy      Satisfactory for evaluation; transformation zone component PRESENT.   Diagnosis      - Negative for intraepithelial lesion or malignancy (NILM)   Comment Normal Reference Range HPV -  Negative       Assessment & Plan:   Problem List Items Addressed This Visit       Other   Anxiety and depression - Primary     Follow up plan: No  follow-ups on file.

## 2022-09-10 NOTE — Telephone Encounter (Signed)
Returned patient's call. Patient had wanted to speak to Laura Soto, but relented and spoke with me about her having Jolene give her an increase in her medication,  was given an appt with Clydie Braun tomorrow to speak about increasing her Fluoxetine since she is having troubles dealing with having to take care of her mother.

## 2022-09-11 ENCOUNTER — Telehealth (INDEPENDENT_AMBULATORY_CARE_PROVIDER_SITE_OTHER): Payer: 59 | Admitting: Nurse Practitioner

## 2022-09-11 ENCOUNTER — Encounter: Payer: Self-pay | Admitting: Nurse Practitioner

## 2022-09-11 DIAGNOSIS — F32A Depression, unspecified: Secondary | ICD-10-CM | POA: Diagnosis not present

## 2022-09-11 DIAGNOSIS — F419 Anxiety disorder, unspecified: Secondary | ICD-10-CM | POA: Diagnosis not present

## 2022-09-11 MED ORDER — FLUOXETINE HCL 10 MG PO TABS: 10.0000 mg | ORAL_TABLET | Freq: Every day | ORAL | 0 refills | Status: DC

## 2022-09-11 MED ORDER — FLUOXETINE HCL 20 MG PO TABS: 20.0000 mg | ORAL_TABLET | Freq: Every day | ORAL | 0 refills | Status: DC

## 2022-09-11 NOTE — Progress Notes (Signed)
Scheduled month follow up via my chart video visit for 10/11/2022 @ 10:40 am.

## 2022-09-11 NOTE — Assessment & Plan Note (Signed)
Chronic. Not well controlled.  Increase to Prozac  for two weeks. If tolerating well after two weeks can increase to  daily.  Follow up in 1 month.  Call sooner if concerns arise.

## 2022-10-06 NOTE — Patient Instructions (Signed)
Managing Anxiety, Adult After being diagnosed with anxiety, you may be relieved to know why you have felt or behaved a certain way. You may also feel overwhelmed about the treatment ahead and what it will mean for your life. With care and support, you can manage your anxiety. How to manage lifestyle changes Understanding the difference between stress and anxiety Although stress can play a role in anxiety, it is not the same as anxiety. Stress is your body's reaction to life changes and events, both good and bad. Stress is often caused by something external, such as a deadline, test, or competition. It normally goes away after the event has ended and will last just a few hours. But, stress can be ongoing and can lead to more than just stress. Anxiety is caused by something internal, such as imagining a terrible outcome or worrying that something will go wrong that will greatly upset you. Anxiety often does not go away even after the event is over, and it can become a long-term (chronic) worry. Lowering stress and anxiety Talk with your health care provider or a counselor to learn more about lowering anxiety and stress. They may suggest tension-reduction techniques, such as: Music. Spend time creating or listening to music that you enjoy and that inspires you. Mindfulness-based meditation. Practice being aware of your normal breaths while not trying to control your breathing. It can be done while sitting or walking. Centering prayer. Focus on a word, phrase, or sacred image that means something to you and brings you peace. Deep breathing. Expand your stomach and inhale slowly through your nose. Hold your breath for 3-5 seconds. Then breathe out slowly, letting your stomach muscles relax. Self-talk. Learn to notice and spot thought patterns that lead to anxiety reactions. Change those patterns to thoughts that feel peaceful. Muscle relaxation. Take time to tense muscles and then relax them. Choose a  tension-reduction technique that fits your lifestyle and personality. These techniques take time and practice. Set aside 5-15 minutes a day to do them. Specialized therapists can offer counseling and training in these techniques. The training to help with anxiety may be covered by some insurance plans. Other things you can do to manage stress and anxiety include: Keeping a stress diary. This can help you learn what triggers your reaction and then learn ways to manage your response. Thinking about how you react to certain situations. You may not be able to control everything, but you can control your response. Making time for activities that help you relax and not feeling guilty about spending your time in this way. Doing visual imagery. This involves imagining or creating mental pictures to help you relax. Practicing yoga. Through yoga poses, you can lower tension and relax.  Medicines Medicines for anxiety include: Antidepressant medicines. These are usually prescribed for long-term daily control. Anti-anxiety medicines. These may be added in severe cases, especially when panic attacks occur. When used together, medicines, psychotherapy, and tension-reduction techniques may be the most effective treatment. Relationships Relationships can play a big part in helping you recover. Spend more time connecting with trusted friends and family members. Think about going to couples counseling if you have a partner, taking family education classes, or going to family therapy. Therapy can help you and others better understand your anxiety. How to recognize changes in your anxiety Everyone responds differently to treatment for anxiety. Recovery from anxiety happens when symptoms lessen and stop interfering with your daily life at home or work. This may mean that you   will start to: Have better concentration and focus. Worry will interfere less in your daily thinking. Sleep better. Be less irritable. Have more  energy. Have improved memory. Try to recognize when your condition is getting worse. Contact your provider if your symptoms interfere with home or work and you feel like your condition is not improving. Follow these instructions at home: Activity Exercise. Adults should: Exercise for at least 150 minutes each week. The exercise should increase your heart rate and make you sweat (moderate-intensity exercise). Do strengthening exercises at least twice a week. Get the right amount and quality of sleep. Most adults need 7-9 hours of sleep each night. Lifestyle  Eat a healthy diet that includes plenty of vegetables, fruits, whole grains, low-fat dairy products, and lean protein. Do not eat a lot of foods that are high in fats, added sugars, or salt (sodium). Make choices that simplify your life. Do not use any products that contain nicotine or tobacco. These products include cigarettes, chewing tobacco, and vaping devices, such as e-cigarettes. If you need help quitting, ask your provider. Avoid caffeine, alcohol, and certain over-the-counter cold medicines. These may make you feel worse. Ask your pharmacist which medicines to avoid. General instructions Take over-the-counter and prescription medicines only as told by your provider. Keep all follow-up visits. This is to make sure you are managing your anxiety well or if you need more support. Where to find support You can get help and support from: Self-help groups. Online and community organizations. A trusted spiritual leader. Couples counseling. Family education classes. Family therapy. Where to find more information You may find that joining a support group helps you deal with your anxiety. The following sources can help you find counselors or support groups near you: Mental Health America: mentalhealthamerica.net Anxiety and Depression Association of America (ADAA): adaa.org National Alliance on Mental Illness (NAMI): nami.org Contact  a health care provider if: You have a hard time staying focused or finishing tasks. You spend many hours a day feeling worried about everyday life. You are very tired because you cannot stop worrying. You start to have headaches or often feel tense. You have chronic nausea or diarrhea. Get help right away if: Your heart feels like it is racing. You have shortness of breath. You have thoughts of hurting yourself or others. Get help right away if you feel like you may hurt yourself or others, or have thoughts about taking your own life. Go to your nearest emergency room or: Call 911. Call the National Suicide Prevention Lifeline at 1-800-273-8255 or 988. This is open 24 hours a day. Text the Crisis Text Line at 741741. This information is not intended to replace advice given to you by your health care provider. Make sure you discuss any questions you have with your health care provider. Document Revised: 02/19/2022 Document Reviewed: 09/03/2020 Elsevier Patient Education  2023 [employer]  

## 2022-10-11 ENCOUNTER — Encounter: Payer: Self-pay | Admitting: Nurse Practitioner

## 2022-10-11 ENCOUNTER — Telehealth (INDEPENDENT_AMBULATORY_CARE_PROVIDER_SITE_OTHER): Payer: 59 | Admitting: Nurse Practitioner

## 2022-10-11 DIAGNOSIS — F32A Depression, unspecified: Secondary | ICD-10-CM

## 2022-10-11 DIAGNOSIS — F419 Anxiety disorder, unspecified: Secondary | ICD-10-CM | POA: Diagnosis not present

## 2022-10-11 MED ORDER — FLUOXETINE HCL 10 MG PO TABS: 10.0000 mg | ORAL_TABLET | Freq: Every day | ORAL | 4 refills | Status: DC

## 2022-10-11 MED ORDER — FLUOXETINE HCL 20 MG PO TABS: 20.0000 mg | ORAL_TABLET | Freq: Every day | ORAL | 4 refills | Status: DC

## 2022-10-11 NOTE — Assessment & Plan Note (Signed)
Chronic, ongoing.  Denies SI/HI.  Improved mood with 20 MG dosing Prozac, but GAD score still at 17.  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  Recommend she increase to the 30 MG of Prozac as was prescribed last visit (20 MG and 10 MG tablets).  She will trial this and plan on follow-up in 4 weeks.  Could add on Buspar in future if ongoing anxiety.

## 2022-10-11 NOTE — Progress Notes (Signed)
LMP  (LMP Unknown)    Subjective:    Patient ID: Laura Soto, female    DOB: 22-Dec-1960, 62 y.o.   MRN: [id number]  HPI: Laura Soto is a 62 y.o. female  Chief Complaint  Patient presents with   Anxiety   Depression   This visit was completed via video visit through MyChart due to the restrictions of the COVID-19 pandemic. All issues as above were discussed and addressed. Physical exam was done as above through visual confirmation on video through MyChart. If it was felt that the patient should be evaluated in the office, they were directed there. The patient verbally consented to this visit. Location of the patient: home Location of the provider: work Those involved with this call:  Provider: Aura Dials, DNP CMA: Tristan Schroeder, CMA Front Desk/Registration: Kandice Hams  Time spent on call:  21 minutes with patient face to face via video conference. More than 50% of this time was spent in counseling and coordination of care. 15 minutes total spent in review of patient's record and preparation of their chart.  I verified patient identity using two factors (patient name and date of birth). Patient consents verbally to being seen via telemedicine visit today.    ANXIETY/STRESS Follow-up today for mood, Prozac was increased to 20 MG on 09/11/22 -- has not increased to 30 MG daily.  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    In past has taken Buspar, Celexa, Sertraline, Prozac, Trazodone (still takes this for sleep). Duration:stable -- she reports better mood with current dosing Anxious mood: yes  Excessive worrying: yes Irritability: no  Sweating: no Nausea: no Palpitations:no Hyperventilation: no Panic attacks: no Agoraphobia: no  Obscessions/compulsions: no Depressed mood: no    01-Nov-2022   10:35 AM 09/11/2022    8:27 AM 06/06/2022   10:59 AM 04/25/2022   10:05 AM 12/04/2021    10:52 AM  Depression screen PHQ 2/9  Decreased Interest 0 2 0 2 0  Down, Depressed, Hopeless 0 2 0 2 0  PHQ - 2 Score 0 4 0 4 0  Altered sleeping 1 2 0 1 3  Tired, decreased energy 1 2 0 2 0  Change in appetite 1 0 0 0 0  Feeling bad or failure about yourself  0 0 0 0 0  Trouble concentrating 1 0 0 1 0  Moving slowly or fidgety/restless 0 0 0 0 0  Suicidal thoughts 0 0 0 0 0  PHQ-9 Score 4 8 0 8 3  Difficult doing work/chores Somewhat difficult Somewhat difficult Not difficult at all Somewhat difficult Not difficult at all  Anhedonia: no Weight changes: no Insomnia: takes Trazodone at times and Melatonin PRN Hypersomnia: no Fatigue/loss of energy: no Feelings of worthlessness: no Feelings of guilt: no Impaired concentration/indecisiveness: no Suicidal ideations: no  Crying spells: no Recent Stressors/Life Changes: yes   Relationship problems: no   Family stress: yes     Financial stress: no    Job stress: no    Recent death/loss: no     11/01/22   10:36 AM 09/11/2022    8:29 AM 06/06/2022   11:00 AM 04/25/2022   10:05 AM  GAD 7 : Generalized Anxiety Score  Nervous, Anxious, on Edge 3 1 0 2  Control/stop worrying 3 1 0 2  Worry too much - different things 3 1 0 2  Trouble relaxing  3 3 0 2  Restless 3 3 0 2  Easily annoyed or irritable 2 1 1 2   Afraid - awful might happen 0 0 0 1  Total GAD 7 Score 17 10 1 13   Anxiety Difficulty Somewhat difficult Somewhat difficult Not difficult at all Somewhat difficult   Relevant past medical, surgical, family and social history reviewed and updated as indicated. Interim medical history since our last visit reviewed. Allergies and medications reviewed and updated.  Review of Systems  Constitutional:  Negative for activity change, appetite change, diaphoresis, fatigue and fever.  Respiratory: Negative.    Cardiovascular: Negative.   Gastrointestinal: Negative.   Endocrine: Negative for cold intolerance and heat intolerance.   Neurological: Negative.   Psychiatric/Behavioral:  Positive for sleep disturbance. Negative for decreased concentration, self-injury and suicidal ideas. The patient is nervous/anxious.    Per HPI unless specifically indicated above     Objective:    LMP  (LMP Unknown)   Wt Readings from Last 3 Encounters:  04/25/22 146 lb 14.4 oz (66.6 kg)  04/03/22 146 lb 8 oz (66.5 kg)  01/01/22 143 lb (64.9 kg)    Physical Exam Vitals and nursing note reviewed.  Constitutional:      General: She is awake. She is not in acute distress.    Appearance: She is well-developed and well-groomed. She is not ill-appearing or toxic-appearing.  HENT:     Head: Normocephalic.     Right Ear: Hearing normal.     Left Ear: Hearing normal.  Eyes:     General: Lids are normal.        Right eye: No discharge.        Left eye: No discharge.     Conjunctiva/sclera: Conjunctivae normal.  Pulmonary:     Effort: Pulmonary effort is normal. No accessory muscle usage or respiratory distress.  Musculoskeletal:     Cervical back: Normal range of motion.  Neurological:     Mental Status: She is alert and oriented to person, place, and time.  Psychiatric:        Attention and Perception: Attention normal.        Mood and Affect: Mood normal.        Behavior: Behavior normal. Behavior is cooperative.        Thought Content: Thought content normal.        Judgment: Judgment normal.    Results for orders placed or performed in visit on 01/01/22  Microscopic Examination  Result Value Ref Range   WBC, UA 0-5 0 - 5 /hpf   RBC, Urine None seen 0 - 2 /hpf   Epithelial Cells (non renal) None seen 0 - 10 /hpf   Bacteria, UA None seen None seen/Few  WET PREP FOR TRICH, YEAST, CLUE   Urine  Result Value Ref Range   Trichomonas Exam Negative Negative   Yeast Exam Negative Negative   Clue Cell Exam Negative Negative  CBC with Differential/Platelet  Result Value Ref Range   WBC 10.9 (H) 3.4 - 10.8 x10E3/uL   RBC  4.97 3.77 - 5.28 x10E6/uL   Hemoglobin 14.4 11.1 - 15.9 g/dL   Hematocrit 16.1 09.6 - 46.6 %   MCV 87 79 - 97 fL   MCH 29.0 26.6 - 33.0 pg   MCHC 33.5 31.5 - 35.7 g/dL   RDW 04.5 40.9 - 81.1 %   Platelets 273 150 - 450 x10E3/uL   Neutrophils 48 Not Estab. %   Lymphs 43 Not Estab. %  Monocytes 6 Not Estab. %   Eos 2 Not Estab. %   Basos 1 Not Estab. %   Neutrophils Absolute 5.3 1.4 - 7.0 x10E3/uL   Lymphocytes Absolute 4.7 (H) 0.7 - 3.1 x10E3/uL   Monocytes Absolute 0.6 0.1 - 0.9 x10E3/uL   EOS (ABSOLUTE) 0.2 0.0 - 0.4 x10E3/uL   Basophils Absolute 0.1 0.0 - 0.2 x10E3/uL   Immature Granulocytes 0 Not Estab. %   Immature Grans (Abs) 0.0 0.0 - 0.1 x10E3/uL  Comprehensive metabolic panel  Result Value Ref Range   Glucose 82 70 - 99 mg/dL   BUN 25 8 - 27 mg/dL   Creatinine, Ser 5.28 (H) 0.57 - 1.00 mg/dL   eGFR 43 (L) >41 LK/GMW/1.02   BUN/Creatinine Ratio 18 12 - 28   Sodium 140 134 - 144 mmol/L   Potassium 4.8 3.5 - 5.2 mmol/L   Chloride 103 96 - 106 mmol/L   CO2 16 (L) 20 - 29 mmol/L   Calcium 10.1 8.7 - 10.3 mg/dL   Total Protein 8.1 6.0 - 8.5 g/dL   Albumin 4.9 3.9 - 4.9 g/dL   Globulin, Total 3.2 1.5 - 4.5 g/dL   Albumin/Globulin Ratio 1.5 1.2 - 2.2   Bilirubin Total 0.2 0.0 - 1.2 mg/dL   Alkaline Phosphatase 108 44 - 121 IU/L   AST 22 0 - 40 IU/L   ALT 17 0 - 32 IU/L  Lipid Panel w/o Chol/HDL Ratio  Result Value Ref Range   Cholesterol, Total 293 (H) 100 - 199 mg/dL   Triglycerides 725 (H) 0 - 149 mg/dL   HDL 54 >36 mg/dL   VLDL Cholesterol Cal 55 (H) 5 - 40 mg/dL   LDL Chol Calc (NIH) 644 (H) 0 - 99 mg/dL  TSH  Result Value Ref Range   TSH 0.786 0.450 - 4.500 uIU/mL  VITAMIN D 25 Hydroxy (Vit-D Deficiency, Fractures)  Result Value Ref Range   Vit D, 25-Hydroxy 32.8 30.0 - 100.0 ng/mL  T4, free  Result Value Ref Range   Free T4 1.80 (H) 0.82 - 1.77 ng/dL  Urinalysis, Routine w reflex microscopic  Result Value Ref Range   Specific Gravity, UA 1.020 1.005 -  1.030   pH, UA 5.5 5.0 - 7.5   Color, UA Yellow Yellow   Appearance Ur Clear Clear   Leukocytes,UA Trace (A) Negative   Protein,UA Negative Negative/Trace   Glucose, UA Negative Negative   Ketones, UA Negative Negative   RBC, UA Negative Negative   Bilirubin, UA Negative Negative   Urobilinogen, Ur 0.2 0.2 - 1.0 mg/dL   Nitrite, UA Negative Negative   Microscopic Examination See below:   Cytology - PAP  Result Value Ref Range   High risk HPV Negative    Adequacy      Satisfactory for evaluation; transformation zone component PRESENT.   Diagnosis      - Negative for intraepithelial lesion or malignancy (NILM)   Comment Normal Reference Range HPV - Negative       Assessment & Plan:   Problem List Items Addressed This Visit       Other   Anxiety and depression    Chronic, ongoing.  Denies SI/HI.  Improved mood with 20 MG dosing Prozac, but GAD score still at 17.  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  Recommend she increase to the 30 MG of Prozac as was prescribed last visit (20 MG and 10 MG tablets).  She will trial this and plan  on follow-up in 4 weeks.  Could add on Buspar in future if ongoing anxiety.      Relevant Medications   FLUoxetine (PROZAC) 20 MG tablet   FLUoxetine (PROZAC) 10 MG tablet    I discussed the assessment and treatment plan with the patient. The patient was provided an opportunity to ask questions and all were answered. The patient agreed with the plan and demonstrated an understanding of the instructions.   The patient was advised to call back or seek an in-person evaluation if the symptoms worsen or if the condition fails to improve as anticipated.   I provided 21+ minutes of time during this encounter.    Follow up plan: Return in about 4 weeks (around 11/08/2022) for MOOD -- anxiety/depression.

## 2022-10-11 NOTE — Progress Notes (Signed)
Called and scheduled appointment for 11/11/2022 @ 11:20 am.

## 2022-11-10 NOTE — Patient Instructions (Signed)

## 2022-11-11 ENCOUNTER — Telehealth (INDEPENDENT_AMBULATORY_CARE_PROVIDER_SITE_OTHER): Payer: 59 | Admitting: Nurse Practitioner

## 2022-11-11 ENCOUNTER — Encounter: Payer: Self-pay | Admitting: Nurse Practitioner

## 2022-11-11 DIAGNOSIS — F32A Depression, unspecified: Secondary | ICD-10-CM

## 2022-11-11 DIAGNOSIS — F419 Anxiety disorder, unspecified: Secondary | ICD-10-CM

## 2022-11-11 MED ORDER — FLUOXETINE HCL 20 MG PO TABS: 30.0000 mg | ORAL_TABLET | Freq: Every day | ORAL | 4 refills | Status: DC

## 2022-11-11 NOTE — Progress Notes (Signed)
Called and scheduled patient 6 month follow up on 05/12/2023 @ 4:00 pm and also cancelled July 12th visit.

## 2022-11-11 NOTE — Progress Notes (Signed)
LMP  (LMP Unknown)    Subjective:    Patient ID: Laura Soto, female    DOB: [birth year], 62 y.o.   MRN: [id number]  HPI: Laura Soto is a 62 y.o. female  Chief Complaint  Patient presents with   Anxiety   Depression   This visit was completed via video visit through MyChart due to the restrictions of the COVID-19 pandemic. All issues as above were discussed and addressed. Physical exam was done as above through visual confirmation on video through MyChart. If it was felt that the patient should be evaluated in the office, they were directed there. The patient verbally consented to this visit. Location of the patient: home Location of the provider: work Those involved with this call:  Provider: Aura Dials, DNP CMA: Tristan Schroeder, CMA Front Desk/Registration: Ozella Almond  Time spent on call:  21 minutes with patient face to face via video conference. More than 50% of this time was spent in counseling and coordination of care. 15 minutes total spent in review of patient's record and preparation of their chart.  I verified patient identity using two factors (patient name and date of birth). Patient consents verbally to being seen via telemedicine visit today.    DEPRESSION/ANXIETY Increased Prozac to 30 MG on 09/11/22 and overall there is improvement in mood.   Mood status: stable Satisfied with current treatment?: yes Symptom severity: moderate  Duration of current treatment : chronic Side effects: no Medication compliance: good compliance Psychotherapy/counseling: no, is not interested Depressed mood: no Anxious mood: occasional Anhedonia: no Significant weight loss or gain: no Insomnia: takes Trazodone to aide with this Fatigue: no Feelings of worthlessness or guilt: no Impaired concentration/indecisiveness: no Suicidal ideations: no Hopelessness: no Crying spells: no    11/11/2022   11:24 AM 10/11/2022   10:35 AM 09/11/2022    8:27 AM 06/06/2022   10:59 AM  04/25/2022   10:05 AM  Depression screen PHQ 2/9  Decreased Interest 0 0 2 0 2  Down, Depressed, Hopeless 0 0 2 0 2  PHQ - 2 Score 0 0 4 0 4  Altered sleeping 0 1 2 0 1  Tired, decreased energy 0 1 2 0 2  Change in appetite 0 1 0 0 0  Feeling bad or failure about yourself  0 0 0 0 0  Trouble concentrating 0 1 0 0 1  Moving slowly or fidgety/restless 0 0 0 0 0  Suicidal thoughts 0 0 0 0 0  PHQ-9 Score 0 4 8 0 8  Difficult doing work/chores Not difficult at all Somewhat difficult Somewhat difficult Not difficult at all Somewhat difficult       11/11/2022   11:25 AM 10/11/2022   10:36 AM 09/11/2022    8:29 AM 06/06/2022   11:00 AM  GAD 7 : Generalized Anxiety Score  Nervous, Anxious, on Edge 0 3 1 0  Control/stop worrying 0 3 1 0  Worry too much - different things 0 3 1 0  Trouble relaxing 0 3 3 0  Restless 0 3 3 0  Easily annoyed or irritable 0 2 1 1   Afraid - awful might happen 0 0 0 0  Total GAD 7 Score 0 17 10 1   Anxiety Difficulty Not difficult at all Somewhat difficult Somewhat difficult Not difficult at all   Relevant past medical, surgical, family and social history reviewed and updated as indicated. Interim medical history since our last visit reviewed. Allergies and medications reviewed and  updated.  Review of Systems  Constitutional:  Negative for activity change, appetite change, diaphoresis, fatigue and fever.  Respiratory:  Negative for cough, chest tightness and shortness of breath.   Cardiovascular:  Negative for chest pain, palpitations and leg swelling.  Gastrointestinal: Negative.   Neurological: Negative.   Psychiatric/Behavioral: Negative.     Per HPI unless specifically indicated above     Objective:    LMP  (LMP Unknown)   Wt Readings from Last 3 Encounters:  04/25/22 146 lb 14.4 oz (66.6 kg)  04/03/22 146 lb 8 oz (66.5 kg)  01/01/22 143 lb (64.9 kg)    Physical Exam Vitals and nursing note reviewed.  Constitutional:      General: She is  awake. She is not in acute distress.    Appearance: She is well-developed. She is not ill-appearing.  HENT:     Head: Normocephalic.     Right Ear: Hearing normal.     Left Ear: Hearing normal.  Eyes:     General: Lids are normal.        Right eye: No discharge.        Left eye: No discharge.     Conjunctiva/sclera: Conjunctivae normal.  Pulmonary:     Effort: Pulmonary effort is normal. No accessory muscle usage or respiratory distress.  Musculoskeletal:     Cervical back: Normal range of motion.  Neurological:     Mental Status: She is alert and oriented to person, place, and time.  Psychiatric:        Attention and Perception: Attention normal.        Mood and Affect: Mood normal.        Behavior: Behavior normal. Behavior is cooperative.        Thought Content: Thought content normal.        Judgment: Judgment normal.    Results for orders placed or performed in visit on 01/01/22  Microscopic Examination  Result Value Ref Range   WBC, UA 0-5 0 - 5 /hpf   RBC, Urine None seen 0 - 2 /hpf   Epithelial Cells (non renal) None seen 0 - 10 /hpf   Bacteria, UA None seen None seen/Few  WET PREP FOR TRICH, YEAST, CLUE   Urine  Result Value Ref Range   Trichomonas Exam Negative Negative   Yeast Exam Negative Negative   Clue Cell Exam Negative Negative  CBC with Differential/Platelet  Result Value Ref Range   WBC 10.9 (H) 3.4 - 10.8 x10E3/uL   RBC 4.97 3.77 - 5.28 x10E6/uL   Hemoglobin 14.4 11.1 - 15.9 g/dL   Hematocrit 81.1 91.4 - 46.6 %   MCV 87 79 - 97 fL   MCH 29.0 26.6 - 33.0 pg   MCHC 33.5 31.5 - 35.7 g/dL   RDW 78.2 95.6 - 21.3 %   Platelets 273 150 - 450 x10E3/uL   Neutrophils 48 Not Estab. %   Lymphs 43 Not Estab. %   Monocytes 6 Not Estab. %   Eos 2 Not Estab. %   Basos 1 Not Estab. %   Neutrophils Absolute 5.3 1.4 - 7.0 x10E3/uL   Lymphocytes Absolute 4.7 (H) 0.7 - 3.1 x10E3/uL   Monocytes Absolute 0.6 0.1 - 0.9 x10E3/uL   EOS (ABSOLUTE) 0.2 0.0 - 0.4  x10E3/uL   Basophils Absolute 0.1 0.0 - 0.2 x10E3/uL   Immature Granulocytes 0 Not Estab. %   Immature Grans (Abs) 0.0 0.0 - 0.1 x10E3/uL  Comprehensive metabolic panel  Result Value Ref  Range   Glucose 82 70 - 99 mg/dL   BUN 25 8 - 27 mg/dL   Creatinine, Ser 1.61 (H) 0.57 - 1.00 mg/dL   eGFR 43 (L) >09 UE/AVW/0.98   BUN/Creatinine Ratio 18 12 - 28   Sodium 140 134 - 144 mmol/L   Potassium 4.8 3.5 - 5.2 mmol/L   Chloride 103 96 - 106 mmol/L   CO2 16 (L) 20 - 29 mmol/L   Calcium 10.1 8.7 - 10.3 mg/dL   Total Protein 8.1 6.0 - 8.5 g/dL   Albumin 4.9 3.9 - 4.9 g/dL   Globulin, Total 3.2 1.5 - 4.5 g/dL   Albumin/Globulin Ratio 1.5 1.2 - 2.2   Bilirubin Total 0.2 0.0 - 1.2 mg/dL   Alkaline Phosphatase 108 44 - 121 IU/L   AST 22 0 - 40 IU/L   ALT 17 0 - 32 IU/L  Lipid Panel w/o Chol/HDL Ratio  Result Value Ref Range   Cholesterol, Total 293 (H) 100 - 199 mg/dL   Triglycerides 119 (H) 0 - 149 mg/dL   HDL 54 >14 mg/dL   VLDL Cholesterol Cal 55 (H) 5 - 40 mg/dL   LDL Chol Calc (NIH) 782 (H) 0 - 99 mg/dL  TSH  Result Value Ref Range   TSH 0.786 0.450 - 4.500 uIU/mL  VITAMIN D 25 Hydroxy (Vit-D Deficiency, Fractures)  Result Value Ref Range   Vit D, 25-Hydroxy 32.8 30.0 - 100.0 ng/mL  T4, free  Result Value Ref Range   Free T4 1.80 (H) 0.82 - 1.77 ng/dL  Urinalysis, Routine w reflex microscopic  Result Value Ref Range   Specific Gravity, UA 1.020 1.005 - 1.030   pH, UA 5.5 5.0 - 7.5   Color, UA Yellow Yellow   Appearance Ur Clear Clear   Leukocytes,UA Trace (A) Negative   Protein,UA Negative Negative/Trace   Glucose, UA Negative Negative   Ketones, UA Negative Negative   RBC, UA Negative Negative   Bilirubin, UA Negative Negative   Urobilinogen, Ur 0.2 0.2 - 1.0 mg/dL   Nitrite, UA Negative Negative   Microscopic Examination See below:   Cytology - PAP  Result Value Ref Range   High risk HPV Negative    Adequacy      Satisfactory for evaluation; transformation zone  component PRESENT.   Diagnosis      - Negative for intraepithelial lesion or malignancy (NILM)   Comment Normal Reference Range HPV - Negative       Assessment & Plan:   Problem List Items Addressed This Visit       Other   Anxiety and depression - Primary    Chronic, stable.  Denies SI/HI.  Improved mood with 30 MG dosing Prozac, with PHQ and GAD 0.  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  Recommend she continue 30 MG of Prozac, will refill for 20 MG tablets (1 1/2 daily).  Could increase to 40 MG in future if worsening mood.  Could add on Buspar in future if ongoing anxiety.      Relevant Medications   FLUoxetine (PROZAC) 20 MG tablet    I discussed the assessment and treatment plan with the patient. The patient was provided an opportunity to ask questions and all were answered. The patient agreed with the plan and demonstrated an understanding of the instructions.   The patient was advised to call back or seek an in-person evaluation if the symptoms worsen or if the condition fails to improve as anticipated.  I provided 21+ minutes of time during this encounter.    Follow up plan: Return in about 6 months (around 05/13/2023).

## 2022-11-11 NOTE — Assessment & Plan Note (Signed)
Chronic, stable.  Denies SI/HI.  Improved mood with 30 MG dosing Prozac, with PHQ and GAD 0.  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  Recommend she continue 30 MG of Prozac, will refill for 20 MG tablets (1 1/2 daily).  Could increase to 40 MG in future if worsening mood.  Could add on Buspar in future if ongoing anxiety.

## 2022-12-06 ENCOUNTER — Ambulatory Visit: Payer: 59 | Admitting: Nurse Practitioner

## 2023-02-12 ENCOUNTER — Ambulatory Visit: Payer: 59 | Admitting: Nurse Practitioner

## 2023-02-12 DIAGNOSIS — F5101 Primary insomnia: Secondary | ICD-10-CM

## 2023-02-12 DIAGNOSIS — E063 Autoimmune thyroiditis: Secondary | ICD-10-CM

## 2023-02-12 DIAGNOSIS — F419 Anxiety disorder, unspecified: Secondary | ICD-10-CM

## 2023-02-12 DIAGNOSIS — E7800 Pure hypercholesterolemia, unspecified: Secondary | ICD-10-CM

## 2023-02-12 DIAGNOSIS — N1831 Chronic kidney disease, stage 3a: Secondary | ICD-10-CM

## 2023-02-14 ENCOUNTER — Encounter: Payer: Self-pay | Admitting: Nurse Practitioner

## 2023-02-14 ENCOUNTER — Ambulatory Visit (INDEPENDENT_AMBULATORY_CARE_PROVIDER_SITE_OTHER): Payer: 59 | Admitting: Nurse Practitioner

## 2023-02-14 VITALS — BP 125/75 | HR 65 | Temp 99.1°F | Ht 64.0 in | Wt 142.4 lb

## 2023-02-14 DIAGNOSIS — F5101 Primary insomnia: Secondary | ICD-10-CM | POA: Diagnosis not present

## 2023-02-14 DIAGNOSIS — F419 Anxiety disorder, unspecified: Secondary | ICD-10-CM

## 2023-02-14 DIAGNOSIS — E063 Autoimmune thyroiditis: Secondary | ICD-10-CM | POA: Diagnosis not present

## 2023-02-14 DIAGNOSIS — F32A Depression, unspecified: Secondary | ICD-10-CM | POA: Diagnosis not present

## 2023-02-14 MED ORDER — LEVOTHYROXINE SODIUM 112 MCG PO TABS: 112.0000 ug | ORAL_TABLET | Freq: Every day | ORAL | 4 refills | Status: DC

## 2023-02-14 MED ORDER — OMEPRAZOLE 20 MG PO CPDR: 20.0000 mg | DELAYED_RELEASE_CAPSULE | Freq: Every day | ORAL | 4 refills | Status: DC

## 2023-02-14 MED ORDER — FLUOXETINE HCL 40 MG PO CAPS: 40.0000 mg | ORAL_CAPSULE | Freq: Every day | ORAL | 4 refills | Status: DC

## 2023-02-14 MED ORDER — TRAZODONE HCL 100 MG PO TABS: 100.0000 mg | ORAL_TABLET | Freq: Every day | ORAL | 4 refills | Status: DC

## 2023-02-14 NOTE — Progress Notes (Signed)
BP 125/75   Pulse 65   Temp 99.1 F (37.3 C) (Oral)   Ht 5\' 4"  (1.626 m)   Wt 142 lb 6.4 oz (64.6 kg)   LMP  (LMP Unknown)   SpO2 98%   BMI 24.44 kg/m    Subjective:    Patient ID: Laura Soto, female    DOB: 07/09/1960, 62 y.o.   MRN: [id number]  HPI: Laura Soto is a 62 y.o. female  Chief Complaint  Patient presents with   Hypothyroidism   HYPOTHYROIDISM & ANXIETY Taking Levothyroxine 112 MCG, presents today as she would like thyroid labs checked.  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 Does still take Trazodone for sleep as needed. Thyroid control status:stable Satisfied with current treatment? yes Medication side effects: no Medication compliance: good compliance Etiology of hypothyroidism: Hashimoto's Recent dose adjustment:no Fatigue: no Cold intolerance: no Heat intolerance: no Weight gain: no Weight loss: no Constipation: no Diarrhea/loose stools: no Palpitations: no Lower extremity edema: no Anxiety/depressed mood:  occasional      11/11/2022   11:24 AM 10/11/2022   10:35 AM 09/11/2022    8:27 AM 06/06/2022   10:59 AM 04/25/2022   10:05 AM  Depression screen PHQ 2/9  Decreased Interest 0 0 2 0 2  Down, Depressed, Hopeless 0 0 2 0 2  PHQ - 2 Score 0 0 4 0 4  Altered sleeping 0 1 2 0 1  Tired, decreased energy 0 1 2 0 2  Change in appetite 0 1 0 0 0  Feeling bad or failure about yourself  0 0 0 0 0  Trouble concentrating 0 1 0 0 1  Moving slowly or fidgety/restless 0 0 0 0 0  Suicidal thoughts 0 0 0 0 0  PHQ-9 Score 0 4 8 0 8  Difficult doing work/chores Not difficult at all Somewhat difficult Somewhat difficult Not difficult at all Somewhat difficult       11/11/2022   11:25 AM 10/11/2022   10:36 AM 09/11/2022    8:29 AM 06/06/2022   11:00 AM  GAD 7 : Generalized Anxiety Score  Nervous, Anxious, on Edge 0 3 1 0  Control/stop worrying 0 3 1 0   Worry too much - different things 0 3 1 0  Trouble relaxing 0 3 3 0  Restless 0 3 3 0  Easily annoyed or irritable 0 2 1 1   Afraid - awful might happen 0 0 0 0  Total GAD 7 Score 0 17 10 1   Anxiety Difficulty Not difficult at all Somewhat difficult Somewhat difficult Not difficult at all   Relevant past medical, surgical, family and social history reviewed and updated as indicated. Interim medical history since our last visit reviewed. Allergies and medications reviewed and updated.  Review of Systems  Constitutional:  Negative for activity change, appetite change, diaphoresis, fatigue and fever.  Respiratory:  Negative for cough, chest tightness and shortness of breath.   Cardiovascular:  Negative for chest pain, palpitations and leg swelling.  Gastrointestinal: Negative.   Neurological: Negative.   Psychiatric/Behavioral:  Negative for decreased concentration, self-injury, sleep disturbance and suicidal ideas. The patient is nervous/anxious.     Per HPI unless specifically indicated above     Objective:    BP 125/75   Pulse 65   Temp 99.1 F (37.3 C) (Oral)   Ht 5\' 4"  (1.626 m)  Wt 142 lb 6.4 oz (64.6 kg)   LMP  (LMP Unknown)   SpO2 98%   BMI 24.44 kg/m   Wt Readings from Last 3 Encounters:  02/14/23 142 lb 6.4 oz (64.6 kg)  04/25/22 146 lb 14.4 oz (66.6 kg)  04/03/22 146 lb 8 oz (66.5 kg)    Physical Exam Vitals and nursing note reviewed.  Constitutional:      General: She is awake. She is not in acute distress.    Appearance: She is well-developed and well-groomed. She is not ill-appearing or toxic-appearing.  HENT:     Head: Normocephalic.     Right Ear: Hearing and external ear normal.     Left Ear: Hearing and external ear normal.  Eyes:     General: Lids are normal.        Right eye: No discharge.        Left eye: No discharge.     Conjunctiva/sclera: Conjunctivae normal.     Pupils: Pupils are equal, round, and reactive to light.  Neck:     Thyroid:  No thyromegaly.     Vascular: No carotid bruit.  Cardiovascular:     Rate and Rhythm: Normal rate and regular rhythm.     Heart sounds: Normal heart sounds. No murmur heard.    No gallop.  Pulmonary:     Effort: Pulmonary effort is normal. No accessory muscle usage or respiratory distress.     Breath sounds: Normal breath sounds.  Abdominal:     General: Bowel sounds are normal. There is no distension.     Palpations: Abdomen is soft.     Tenderness: There is no abdominal tenderness.  Musculoskeletal:     Cervical back: Normal range of motion and neck supple.     Right lower leg: No edema.     Left lower leg: No edema.  Lymphadenopathy:     Cervical: No cervical adenopathy.  Skin:    General: Skin is warm and dry.  Neurological:     Mental Status: She is alert and oriented to person, place, and time.     Deep Tendon Reflexes: Reflexes are normal and symmetric.     Reflex Scores:      Brachioradialis reflexes are 2+ on the right side and 2+ on the left side.      Patellar reflexes are 2+ on the right side and 2+ on the left side. Psychiatric:        Attention and Perception: Attention normal.        Mood and Affect: Mood normal.        Speech: Speech normal.        Behavior: Behavior normal. Behavior is cooperative.        Thought Content: Thought content normal.     Results for orders placed or performed in visit on 01/01/22  Microscopic Examination  Result Value Ref Range   WBC, UA 0-5 0 - 5 /hpf   RBC, Urine None seen 0 - 2 /hpf   Epithelial Cells (non renal) None seen 0 - 10 /hpf   Bacteria, UA None seen None seen/Few  WET PREP FOR TRICH, YEAST, CLUE   Urine  Result Value Ref Range   Trichomonas Exam Negative Negative   Yeast Exam Negative Negative   Clue Cell Exam Negative Negative  CBC with Differential/Platelet  Result Value Ref Range   WBC 10.9 (H) 3.4 - 10.8 x10E3/uL   RBC 4.97 3.77 - 5.28 x10E6/uL   Hemoglobin 14.4  11.1 - 15.9 g/dL   Hematocrit 40.9 81.1  - 46.6 %   MCV 87 79 - 97 fL   MCH 29.0 26.6 - 33.0 pg   MCHC 33.5 31.5 - 35.7 g/dL   RDW 91.4 78.2 - 95.6 %   Platelets 273 150 - 450 x10E3/uL   Neutrophils 48 Not Estab. %   Lymphs 43 Not Estab. %   Monocytes 6 Not Estab. %   Eos 2 Not Estab. %   Basos 1 Not Estab. %   Neutrophils Absolute 5.3 1.4 - 7.0 x10E3/uL   Lymphocytes Absolute 4.7 (H) 0.7 - 3.1 x10E3/uL   Monocytes Absolute 0.6 0.1 - 0.9 x10E3/uL   EOS (ABSOLUTE) 0.2 0.0 - 0.4 x10E3/uL   Basophils Absolute 0.1 0.0 - 0.2 x10E3/uL   Immature Granulocytes 0 Not Estab. %   Immature Grans (Abs) 0.0 0.0 - 0.1 x10E3/uL  Comprehensive metabolic panel  Result Value Ref Range   Glucose 82 70 - 99 mg/dL   BUN 25 8 - 27 mg/dL   Creatinine, Ser 2.13 (H) 0.57 - 1.00 mg/dL   eGFR 43 (L) >08 MV/HQI/6.96   BUN/Creatinine Ratio 18 12 - 28   Sodium 140 134 - 144 mmol/L   Potassium 4.8 3.5 - 5.2 mmol/L   Chloride 103 96 - 106 mmol/L   CO2 16 (L) 20 - 29 mmol/L   Calcium 10.1 8.7 - 10.3 mg/dL   Total Protein 8.1 6.0 - 8.5 g/dL   Albumin 4.9 3.9 - 4.9 g/dL   Globulin, Total 3.2 1.5 - 4.5 g/dL   Albumin/Globulin Ratio 1.5 1.2 - 2.2   Bilirubin Total 0.2 0.0 - 1.2 mg/dL   Alkaline Phosphatase 108 44 - 121 IU/L   AST 22 0 - 40 IU/L   ALT 17 0 - 32 IU/L  Lipid Panel w/o Chol/HDL Ratio  Result Value Ref Range   Cholesterol, Total 293 (H) 100 - 199 mg/dL   Triglycerides 295 (H) 0 - 149 mg/dL   HDL 54 >28 mg/dL   VLDL Cholesterol Cal 55 (H) 5 - 40 mg/dL   LDL Chol Calc (NIH) 413 (H) 0 - 99 mg/dL  TSH  Result Value Ref Range   TSH 0.786 0.450 - 4.500 uIU/mL  VITAMIN D 25 Hydroxy (Vit-D Deficiency, Fractures)  Result Value Ref Range   Vit D, 25-Hydroxy 32.8 30.0 - 100.0 ng/mL  T4, free  Result Value Ref Range   Free T4 1.80 (H) 0.82 - 1.77 ng/dL  Urinalysis, Routine w reflex microscopic  Result Value Ref Range   Specific Gravity, UA 1.020 1.005 - 1.030   pH, UA 5.5 5.0 - 7.5   Color, UA Yellow Yellow   Appearance Ur Clear Clear    Leukocytes,UA Trace (A) Negative   Protein,UA Negative Negative/Trace   Glucose, UA Negative Negative   Ketones, UA Negative Negative   RBC, UA Negative Negative   Bilirubin, UA Negative Negative   Urobilinogen, Ur 0.2 0.2 - 1.0 mg/dL   Nitrite, UA Negative Negative   Microscopic Examination See below:   Cytology - PAP  Result Value Ref Range   High risk HPV Negative    Adequacy      Satisfactory for evaluation; transformation zone component PRESENT.   Diagnosis      - Negative for intraepithelial lesion or malignancy (NILM)   Comment Normal Reference Range HPV - Negative       Assessment & Plan:   Problem List Items Addressed This Visit  Endocrine   Hashimoto's thyroiditis - Primary    Chronic, ongoing.  Continue current Levothyroxine dose and recheck TSH and Free T4 today.  Adjust dose as needed based on results.        Relevant Medications   levothyroxine (SYNTHROID) 112 MCG tablet   Other Relevant Orders   T4, free   TSH     Other   Anxiety and depression    Chronic, stable.  Denies SI/HI.  Reports still some occasional increased anxiety days.  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  Will increase Prozac to 40 MG, which may offer her more benefit with her anxiety.  Discussed with her and educated.  Avoid Buspar, did not tolerate.      Relevant Medications   traZODone (DESYREL) 100 MG tablet   FLUoxetine (PROZAC) 40 MG capsule   Insomnia    Chronic, ongoing.  Will continue Trazodone 100 MG as needed at night, educated her on this medication use and side effects.  She tolerates well at baseline. ?Maintain a regular sleep schedule, particularly a regular wake-up time in the morning ?Try not to force sleep ?Avoid caffeinated beverages after lunch ?Avoid alcohol near bedtime (eg, late afternoon and evening)  ?Avoid smoking or other nicotine intake, particularly during the evening ?Adjust the bedroom environment as needed to decrease stimuli  (eg, reduce ambient light, turn off the television or radio) ?Avoid prolonged use of light-emitting screens (laptops, tablets, smartphones, ebooks) before bedtime  ?Resolve concerns or worries before bedtime ?Exercise regularly for at least 20 minutes, preferably more than four to five hours prior to bedtime  ?Avoid daytime naps, especially if they are longer than 20 to 30 minutes or occur late in the day         Follow up plan: Return in about 8 months (around 10/14/2023) for Annual Physical -- will have Medicare.

## 2023-02-14 NOTE — Assessment & Plan Note (Signed)
Chronic, ongoing.  Continue current Levothyroxine dose and recheck TSH and Free T4 today.  Adjust dose as needed based on results.

## 2023-02-14 NOTE — Patient Instructions (Signed)

## 2023-02-14 NOTE — Assessment & Plan Note (Addendum)
Chronic, ongoing.  Will continue Trazodone 100 MG as needed at night, educated her on this medication use and side effects.  She tolerates well at baseline. ?Maintain a regular sleep schedule, particularly a regular wake-up time in the morning ?Try not to force sleep ?Avoid caffeinated beverages after lunch ?Avoid alcohol near bedtime (eg, late afternoon and evening)  ?Avoid smoking or other nicotine intake, particularly during the evening ?Adjust the bedroom environment as needed to decrease stimuli (eg, reduce ambient light, turn off the television or radio) ?Avoid prolonged use of light-emitting screens (laptops, tablets, smartphones, ebooks) before bedtime  ?Resolve concerns or worries before bedtime ?Exercise regularly for at least 20 minutes, preferably more than four to five hours prior to bedtime  ?Avoid daytime naps, especially if they are longer than 20 to 30 minutes or occur late in the day

## 2023-02-14 NOTE — Assessment & Plan Note (Signed)
Chronic, stable.  Denies SI/HI.  Reports still some occasional increased anxiety days.  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  Will increase Prozac to 40 MG, which may offer her more benefit with her anxiety.  Discussed with her and educated.  Avoid Buspar, did not tolerate.

## 2023-02-15 LAB — T4, FREE: Free T4: 1.51 ng/dL (ref 0.82–1.77)

## 2023-02-15 LAB — TSH: TSH: 1 u[IU]/mL (ref 0.450–4.500)

## 2023-02-15 NOTE — Progress Notes (Signed)
Contacted via MyChart   Good morning Janasia, your labs have returned and thyroid remains stable.  At next visit we will check all labs, including kidney function, to ensure all is well.  Any questions? Keep being stellar!!  Thank you for allowing me to participate in your care.  I appreciate you. Kindest regards, Katarzyna Wolven

## 2023-05-12 ENCOUNTER — Encounter: Payer: 59 | Admitting: Nurse Practitioner

## 2023-06-12 ENCOUNTER — Ambulatory Visit: Payer: Self-pay

## 2023-06-12 ENCOUNTER — Other Ambulatory Visit: Payer: Self-pay | Admitting: Nurse Practitioner

## 2023-06-12 NOTE — Telephone Encounter (Signed)
Patient no longer needs to talk to nurse and her medication questions have been answered.

## 2023-06-12 NOTE — Telephone Encounter (Signed)
  Chief Complaint: Thought that she was out of refills for levothyroxine Symptoms:  Frequency:  Pertinent Negatives: Patient denies  Disposition: [] ED /[] Urgent Care (no appt availability in office) / [] Appointment(In office/virtual)/ []  [facility]/ [x] Home Care/ [] Refused Recommended Disposition /[] [facility]/ []  Follow-up with PCP Additional Notes: Returned pt's call. Pt states that she thought she was out of refills for levothyroxine. Pt states that she does have refills. No further issues that pt wanted to discuss.    Summary: Requesting call back   Pt requesting to speak to a nurse for something personal. Pt declined to give further information.  Pt requesting call back     Reason for Disposition  Patient has refills remaining on their prescription  Answer Assessment - Initial Assessment Questions 1. DRUG NAME: "What medicine do you need to have refilled?"     Levothyroxine  Protocols used: Medication Refill and Renewal Call-A-AH

## 2023-06-12 NOTE — Telephone Encounter (Signed)
Request is too soon refill, last refill 02/14/23 for 90 and 3 refills.  Requested Prescriptions  Pending Prescriptions Disp Refills   levothyroxine (SYNTHROID) 112 MCG tablet 90 tablet 4    Sig: Take 1 tablet (112 mcg total) by mouth daily before breakfast.     Endocrinology:  Hypothyroid Agents Passed - 06/12/2023 11:15 AM      Passed - TSH in normal range and within 360 days    TSH  Date Value Ref Range Status  02/14/2023 1.000 0.450 - 4.500 uIU/mL Final         Passed - Valid encounter within last 12 months    Recent Outpatient Visits           3 months ago Hashimoto's thyroiditis   [facility] [place], [place] T, NP   7 months ago Anxiety and depression   [place] [facility] [place], [place] T, NP   8 months ago Anxiety and depression   [place] [facility] [place], [place] T, NP   9 months ago Anxiety and depression   [place] [facility] Larae Grooms, NP   1 year ago Anxiety and depression   [place] [facility] [place], Dorie Rank, NP       Future Appointments             In 4 months Cannady, Dorie Rank, NP [place] [facility]

## 2023-06-12 NOTE — Telephone Encounter (Signed)
Medication Refill -  Most Recent Primary Care Visit:  Provider: Aura Dials T  Department: [facility]  Visit Type: OFFICE VISIT  Date: 02/14/2023  Medication: levothyroxine (SYNTHROID) 112 MCG tablet  Pt is almost out & does not currently have insurance but is working on Health visitor.   Has the patient contacted their pharmacy? No Pt preferred to reach out to office as she currently has no insurance.   Is this the correct pharmacy for this prescription? Yes This is the patient's preferred pharmacy:  [facility] - [place], [place] - [address] [place] [address] Phone: [phone] Fax: [phone]   Has the prescription been filled recently? No  Is the patient out of the medication? No  Has the patient been seen for an appointment in the last year OR does the patient have an upcoming appointment? Yes  Can we respond through MyChart? Yes  Agent: Please be advised that Rx refills may take up to 3 business days. We ask that you follow-up with your pharmacy.

## 2023-06-12 NOTE — Telephone Encounter (Signed)
Summary: Requesting call back   Pt requesting to speak to a nurse for something personal. Pt declined to give further information.  Pt requesting call back      Called pt - left message on machine to return our call.

## 2023-06-12 NOTE — Telephone Encounter (Signed)
Patient called, left VM to return the call to the office to speak to the NT.    Summary: Requesting call back   Pt requesting to speak to a nurse for something personal. Pt declined to give further information.  Pt requesting call back

## 2023-07-09 ENCOUNTER — Encounter: Payer: Self-pay | Admitting: Nurse Practitioner

## 2023-07-10 ENCOUNTER — Encounter: Payer: Self-pay | Admitting: Emergency Medicine

## 2023-07-10 ENCOUNTER — Other Ambulatory Visit: Payer: Self-pay

## 2023-07-10 ENCOUNTER — Ambulatory Visit
Admission: EM | Admit: 2023-07-10 | Discharge: 2023-07-10 | Disposition: A | Discharge status: Home / Self Care | Payer: Medicaid Other | Attending: Emergency Medicine | Admitting: Emergency Medicine

## 2023-07-10 DIAGNOSIS — J0110 Acute frontal sinusitis, unspecified: Secondary | ICD-10-CM

## 2023-07-10 MED ORDER — AMOXICILLIN-POT CLAVULANATE 875-125 MG PO TABS: 1.0000 | ORAL_TABLET | Freq: Two times a day (BID) | ORAL | 0 refills | Status: DC

## 2023-07-10 NOTE — ED Triage Notes (Signed)
Pt c/o persistent cough, running nose and HA for the past 3 weeks

## 2023-07-10 NOTE — ED Provider Notes (Signed)
Laura Soto    CSN: [id number] Arrival date & time: 07/10/23  1619      History   Chief Complaint Chief Complaint  Patient presents with   Cough    HPI Laura Soto is a 63 y.o. female.  Patient presents with 3-week history of frontal sinus pressure, congestion, runny nose, postnasal drip, cough.  No fever or shortness of breath.  Several OTC treatments medications without relief.  The history is provided by the patient and medical records.    Past Medical History:  Diagnosis Date   Abnormal mammogram, unspecified    Breast screening, unspecified    Depression    GERD (gastroesophageal reflux disease)    Hypercalcemia    Hyperlipidemia    Hypothyroidism    Osteopenia    Personal history of tobacco use, presenting hazards to health     Patient Active Problem List   Diagnosis Date Noted   Elevated low density lipoprotein (LDL) cholesterol level 01/02/2022   Insomnia 12/04/2021   Vaginal atrophy 07/28/2019   CKD (chronic kidney disease) stage 3, GFR 30-59 ml/min (HCC) 02/26/2019   GERD (gastroesophageal reflux disease) 06/13/2017   Anxiety and depression 03/06/2016   Allergic rhinitis 03/06/2016   Hashimoto's thyroiditis 12/26/2015   Personal history of tobacco use, presenting hazards to health     Past Surgical History:  Procedure Laterality Date   COLONOSCOPY WITH PROPOFOL N/A 06/25/2017   Procedure: COLONOSCOPY WITH PROPOFOL;  Surgeon: Wyline Mood, MD;  Location: [facility] ENDOSCOPY;  Service: Gastroenterology;  Laterality: N/A;   ESOPHAGOGASTRODUODENOSCOPY (EGD) WITH PROPOFOL N/A 06/25/2017   Procedure: ESOPHAGOGASTRODUODENOSCOPY (EGD) WITH PROPOFOL;  Surgeon: Wyline Mood, MD;  Location: [facility] ENDOSCOPY;  Service: Gastroenterology;  Laterality: N/A;   WISDOM TOOTH EXTRACTION      OB History     Gravida  1   Para      Term      Preterm      AB      Living         SAB      IAB      Ectopic      Multiple      Live Births            Obstetric Comments  Age 67 at first pregnancy.   Last Menstrual Period- approximately 2003.  Menstrual Period started at age 24.          Home Medications    Prior to Admission medications   Medication Sig Start Date End Date Taking? Authorizing Provider  amoxicillin-clavulanate (AUGMENTIN) 875-125 MG tablet Take 1 tablet by mouth every 12 (twelve) hours. 07/10/23  Yes Mickie Bail, NP  FLUoxetine (PROZAC) 40 MG capsule Take 1 capsule (40 mg total) by mouth daily. 02/14/23   Aura Dials T, NP  levothyroxine (SYNTHROID) 112 MCG tablet Take 1 tablet (112 mcg total) by mouth daily before breakfast. 02/14/23   Cannady, Corrie Dandy T, NP  omeprazole (PRILOSEC) 20 MG capsule Take 1 capsule (20 mg total) by mouth daily. 02/14/23   Cannady, Corrie Dandy T, NP  traZODone (DESYREL) 100 MG tablet Take 1 tablet (100 mg total) by mouth at bedtime. 02/14/23   Marjie Skiff, NP    Family History Family History  Problem Relation Age of Onset   Liver cancer Father        age 25   Diabetes Father    Stroke Mother    Atrial fibrillation Mother     Social History Social History  Tobacco Use   Smoking status: Former    Current packs/day: 0.00    Average packs/day: 0.3 packs/day for 20.0 years (5.0 ttl pk-yrs)    Types: Cigarettes    Start date: 12/26/1994    Quit date: 12/26/2014    Years since quitting: 8.5   Smokeless tobacco: Never  Vaping Use   Vaping status: Never Used  Substance Use Topics   Alcohol use: Yes    Comment: occasionally - wine. none last 24hrs   Drug use: No     Allergies   Patient has no known allergies.   Review of Systems Review of Systems  Constitutional:  Negative for chills and fever.  HENT:  Positive for congestion, postnasal drip, rhinorrhea and sinus pressure. Negative for ear pain and sore throat.   Respiratory:  Positive for cough. Negative for shortness of breath.      Physical Exam Triage Vital Signs ED Triage Vitals  Encounter Vitals Group     BP  07/10/23 1627 103/69     Systolic BP Percentile --      Diastolic BP Percentile --      Pulse Rate 07/10/23 1627 70     Resp 07/10/23 1627 18     Temp 07/10/23 1627 99.2 F (37.3 C)     Temp Source 07/10/23 1627 Oral     SpO2 07/10/23 1627 100 %     Weight --      Height --      Head Circumference --      Peak Flow --      Pain Score 07/10/23 1628 0     Pain Loc --      Pain Education --      Exclude from Growth Chart --    No data found.  Updated Vital Signs BP 103/69 (BP Location: Right Arm)   Pulse 70   Temp 99.2 F (37.3 C) (Oral)   Resp 18   LMP  (LMP Unknown)   SpO2 100%   Visual Acuity Right Eye Distance:   Left Eye Distance:   Bilateral Distance:    Right Eye Near:   Left Eye Near:    Bilateral Near:     Physical Exam Constitutional:      General: She is not in acute distress. HENT:     Right Ear: Tympanic membrane normal.     Left Ear: Tympanic membrane normal.     Nose: Congestion and rhinorrhea present.     Mouth/Throat:     Mouth: Mucous membranes are moist.     Pharynx: Oropharynx is clear.  Cardiovascular:     Rate and Rhythm: Normal rate and regular rhythm.     Heart sounds: Normal heart sounds.  Pulmonary:     Effort: Pulmonary effort is normal. No respiratory distress.     Breath sounds: Normal breath sounds.  Neurological:     Mental Status: She is alert.      UC Treatments / Results  Labs (all labs ordered are listed, but only abnormal results are displayed) Labs Reviewed - No data to display  EKG   Radiology No results found.  Procedures Procedures (including critical care time)  Medications Ordered in UC Medications - No data to display  Initial Impression / Assessment and Plan / UC Course  I have reviewed the triage vital signs and the nursing notes.  Pertinent labs & imaging results that were available during my care of the patient were reviewed by me and considered in  my medical decision making (see chart for  details).    Acute sinusitis.  Patient has been symptomatic for 3 weeks.  She is not improving with OTC treatment.  Treating today with Augmentin.  Tylenol or ibuprofen as needed.  Plain Mucinex as needed.  Instructed patient to follow-up with her PCP if she is not improving.  Education provided on sinus infection.  Patient agrees to plan of care.  Final Clinical Impressions(s) / UC Diagnoses   Final diagnoses:  Acute non-recurrent frontal sinusitis     Discharge Instructions      Take the Augmentin as directed.  Follow-up with your primary care provider if your symptoms are not improving.      ED Prescriptions     Medication Sig Dispense Auth. Provider   amoxicillin-clavulanate (AUGMENTIN) 875-125 MG tablet Take 1 tablet by mouth every 12 (twelve) hours. 14 tablet Mickie Bail, NP      PDMP not reviewed this encounter.   Mickie Bail, NP 07/10/23 [phone]

## 2023-07-10 NOTE — Discharge Instructions (Addendum)
Take the Augmentin as directed.  Follow up with your primary care provider if your symptoms are not improving.

## 2023-07-13 NOTE — Patient Instructions (Incomplete)
 Managing Anxiety, Adult  After being diagnosed with anxiety, you may be relieved to know why you have felt or behaved a certain way. You may also feel overwhelmed about the treatment ahead and what it will mean for your life. With care and support, you can manage your anxiety.  How to manage lifestyle changes  Understanding the difference between stress and anxiety  Although stress can play a role in anxiety, it is not the same as anxiety. Stress is your body's reaction to life changes and events, both good and bad. Stress is often caused by something external, such as a deadline, test, or competition. It normally goes away after the event has ended and will last just a few hours. But, stress can be ongoing and can lead to more than just stress.  Anxiety is caused by something internal, such as imagining a terrible outcome or worrying that something will go wrong that will greatly upset you. Anxiety often does not go away even after the event is over, and it can become a long-term (chronic) worry.  Lowering stress and anxiety    Talk with your health care provider or a counselor to learn more about lowering anxiety and stress. They may suggest tension-reduction techniques, such as:  Music. Spend time creating or listening to music that you enjoy and that inspires you.  Mindfulness-based meditation. Practice being aware of your normal breaths while not trying to control your breathing. It can be done while sitting or walking.  Centering prayer. Focus on a word, phrase, or sacred image that means something to you and brings you peace.  Deep breathing. Expand your stomach and inhale slowly through your nose. Hold your breath for 3-5 seconds. Then breathe out slowly, letting your stomach muscles relax.  Self-talk. Learn to notice and spot thought patterns that lead to anxiety reactions. Change those patterns to thoughts that feel peaceful.  Muscle relaxation. Take time to tense muscles and then relax them.  Choose a  tension-reduction technique that fits your lifestyle and personality. These techniques take time and practice. Set aside 5-15 minutes a day to do them. Specialized therapists can offer counseling and training in these techniques. The training to help with anxiety may be covered by some insurance plans.  Other things you can do to manage stress and anxiety include:  Keeping a stress diary. This can help you learn what triggers your reaction and then learn ways to manage your response.  Thinking about how you react to certain situations. You may not be able to control everything, but you can control your response.  Making time for activities that help you relax and not feeling guilty about spending your time in this way.  Doing visual imagery. This involves imagining or creating mental pictures to help you relax.  Practicing yoga. Through yoga poses, you can lower tension and relax.     Medicines  Medicines for anxiety include:  Antidepressant medicines. These are usually prescribed for long-term daily control.  Anti-anxiety medicines. These may be added in severe cases, especially when panic attacks occur.  When used together, medicines, psychotherapy, and tension-reduction techniques may be the most effective treatment.  Relationships  Relationships can play a big part in helping you recover. Spend more time connecting with trusted friends and family members. Think about going to couples counseling if you have a partner, taking family education classes, or going to family therapy. Therapy can help you and others better understand your anxiety.  How to recognize changes in  your anxiety  Everyone responds differently to treatment for anxiety. Recovery from anxiety happens when symptoms lessen and stop interfering with your daily life at home or work. This may mean that you will start to:  Have better concentration and focus. Worry will interfere less in your daily thinking.  Sleep better.  Be less irritable.  Have  more energy.  Have improved memory.  Try to recognize when your condition is getting worse. Contact your provider if your symptoms interfere with home or work and you feel like your condition is not improving.  Follow these instructions at home:  Activity  Exercise. Adults should:  Exercise for at least 150 minutes each week. The exercise should increase your heart rate and make you sweat (moderate-intensity exercise).  Do strengthening exercises at least twice a week.  Get the right amount and quality of sleep. Most adults need 7-9 hours of sleep each night.  Lifestyle    Eat a healthy diet that includes plenty of vegetables, fruits, whole grains, low-fat dairy products, and lean protein.  Do not eat a lot of foods that are high in fats, added sugars, or salt (sodium).  Make choices that simplify your life.  Do not use any products that contain nicotine or tobacco. These products include cigarettes, chewing tobacco, and vaping devices, such as e-cigarettes. If you need help quitting, ask your provider.  Avoid caffeine, alcohol, and certain over-the-counter cold medicines. These may make you feel worse. Ask your pharmacist which medicines to avoid.  General instructions  Take over-the-counter and prescription medicines only as told by your provider.  Keep all follow-up visits. This is to make sure you are managing your anxiety well or if you need more support.  Where to find support  You can get help and support from:  Self-help groups.  Online and [employer].  A trusted spiritual leader.  Couples counseling.  Family education classes.  Family therapy.  Where to find more information  You may find that joining a support group helps you deal with your anxiety. The following sources can help you find counselors or support groups near you:  Mental Health America: mentalhealthamerica.net  Anxiety and Depression Association of [place] (ADAA): adaa.org  [employer] Mental Illness (NAMI):  nami.org  Contact a health care provider if:  You have a hard time staying focused or finishing tasks.  You spend many hours a day feeling worried about everyday life.  You are very tired because you cannot stop worrying.  You start to have headaches or often feel tense.  You have chronic nausea or diarrhea.  Get help right away if:  Your heart feels like it is racing.  You have shortness of breath.  You have thoughts of hurting yourself or others.  Get help right away if you feel like you may hurt yourself or others, or have thoughts about taking your own life. Go to your nearest emergency room or:  Call 911.  Call the National Suicide Prevention Lifeline at 2043122231 or 988. This is open 24 hours a day.  Text the Crisis Text Line at (832)170-7952.  This information is not intended to replace advice given to you by your health care provider. Make sure you discuss any questions you have with your health care provider.  Document Revised: 02/19/2022 Document Reviewed: 09/03/2020  Elsevier Patient Education  2024 [employer].

## 2023-07-15 ENCOUNTER — Ambulatory Visit: Payer: Medicaid Other | Admitting: Nurse Practitioner

## 2023-07-19 NOTE — Patient Instructions (Signed)
 Please call to schedule your mammogram and/or bone density: [facility] at [facility]  Address: [address], [place], [place] [address] Phone: [phone]  [facility] [address] [place],  [place]  [address] Phone: [phone]   Chronic Kidney Disease: Eating Plan Chronic kidney disease (CKD) is when your kidneys aren't working well. They can't remove waste, fluids, and other substances from your blood. When these substances build up, they can worsen kidney damage and affect your health. Eating certain foods can lead to a buildup of these substances. Changing your diet can help prevent more kidney damage. Diet changes may also delay dialysis or even keep you from needing it. What nutrients should I limit? Work with your health care team and an expert in healthy eating (dietitian) to make a meal plan that's right for you. Foods you can eat and foods you should limit or avoid will depend on: The stage of your kidney disease. Any other conditions you have. The items listed below are not a complete list. Talk with a dietitian to learn what's best for you. Potassium Potassium affects how well your heart beats. Too much potassium in your blood can cause an irregular heartbeat or even a heart attack. You may need to limit foods that are high in potassium, such as: Liquid milk and soy milk. Salt substitutes that contain potassium. Fruits like: Bananas. Apricots. Melon. Prunes and raisins. Kiwi. Nectarines and oranges. Vegetables, such as: Potatoes, sweet potatoes, and yams. Tomatoes. Leafy greens. Beets. Avocado. Pumpkin and winter squash. Beans, like lima beans. Nuts. Phosphorus Phosphorus is a mineral found in your bones. You need a balance between calcium and phosphorus to build and maintain healthy bones.  Too much added phosphorus from the foods you eat can pull calcium from your bones. Losing calcium can  make your bones weak and more likely to break. Too much phosphorus can also make your skin itch. You may need to limit foods that are high in phosphorus or that have added phosphorus, such as: Liquid milk and dairy products. Dark-colored sodas or soft drinks. Bran cereals and oatmeal. Protein  Protein helps your body make and keep muscle. Protein also helps to repair your body's cells and tissues.  One of the natural breakdown products of protein is a waste product called urea. When your kidneys aren't working well, they can't remove waste like urea. Reducing protein in your diet can help keep urea from building up in your blood. Depending on your stage of kidney disease, you may need to eat smaller portions of foods that are high in protein. Sources of animal protein include: Meat (all types). Fish and seafood. Poultry. Eggs. Dairy. Other protein foods include: Beans and legumes. Nuts and nut butter. Soy, like tofu.  Sodium Salt (sodium) helps to keep a healthy balance of fluids in your body. Too much salt can increase your blood pressure, which can harm your heart and lungs. Extra salt can also cause your body to keep too much fluid, making your kidneys work harder. You may need to limit or avoid foods that are high in salt, such as: Salt seasonings. Soy and teriyaki sauce. Meats that are: Packaged. Precooked. Cured. Processed. Salted crackers and snack foods. Fast food. Canned soups and foods. Pickled foods. Boxed mixes or ready-to-eat boxed meals and side dishes. Bottled dressings, sauces, and marinades. Talk with your dietitian about how much potassium, phosphorus, protein, and salt you may have each day. What  are tips for following this plan? Reading food labels  Check the amount of salt in foods. Limit foods that have salt listed among the first five ingredients. Try to eat low-salt foods. Check the ingredient list for added phosphorus or potassium. "Phos" in an  ingredient is a sign that phosphorus has been added. Do not buy foods that are calcium-enriched or that have calcium added to them (fortified). Buy canned vegetables and beans that say "no salt added." Rinse them before eating. Lifestyle Limit the amount of protein you eat from animal sources each day. Focus on protein from plant sources, like tofu and dried beans, peas, and lentils. Do not add salt to food when cooking or before eating. Do not eat star fruit. It can be toxic for people with kidney problems. Talk with your health care provider before taking any vitamin or mineral supplements. If told by your provider: Track how much liquid you have so you can avoid drinking too much. Try to eat foods that are made mostly from water, like gelatin, ice cream, soups, and juicy fruits and vegetables. If you have diabetes and chronic kidney disease: If you have diabetes and CKD, you need to keep your blood sugar (glucose) in the target range recommended by your provider. Follow your diabetes management plan. This may include: Checking your blood glucose regularly. Taking medicines by mouth, or taking insulin, or both. Exercising for at least 30 minutes on 5 or more days each week, or as told by your provider. Tracking how many servings of carbohydrates you eat at each meal. Not using orange juice to treat low blood sugars. Instead, use apple juice, cranberry juice, or clear soda. You may be given guidelines on what foods and nutrients you may eat, and how much you can have each day. This depends on your stage of kidney disease and whether you have high blood pressure. Follow the meal plan your dietitian gives you. Where to find more information [employer] of Diabetes and Digestive and Kidney Diseases: [employer].si National Kidney Foundation: kidney.org This information is not intended to replace advice given to you by your health care provider. Make sure you discuss any questions you have  with your health care provider. Document Revised: 12/24/2022 Document Reviewed: 12/24/2022 Elsevier Patient Education  2024 [employer].

## 2023-07-21 ENCOUNTER — Encounter: Payer: Self-pay | Admitting: Nurse Practitioner

## 2023-07-21 ENCOUNTER — Ambulatory Visit (INDEPENDENT_AMBULATORY_CARE_PROVIDER_SITE_OTHER): Payer: Self-pay | Admitting: Nurse Practitioner

## 2023-07-21 VITALS — BP 104/67 | HR 75 | Temp 97.9°F | Ht 64.0 in | Wt 148.0 lb

## 2023-07-21 DIAGNOSIS — F32A Depression, unspecified: Secondary | ICD-10-CM | POA: Diagnosis not present

## 2023-07-21 DIAGNOSIS — E063 Autoimmune thyroiditis: Secondary | ICD-10-CM

## 2023-07-21 DIAGNOSIS — Z23 Encounter for immunization: Secondary | ICD-10-CM

## 2023-07-21 DIAGNOSIS — N1831 Chronic kidney disease, stage 3a: Secondary | ICD-10-CM | POA: Diagnosis not present

## 2023-07-21 DIAGNOSIS — Z1231 Encounter for screening mammogram for malignant neoplasm of breast: Secondary | ICD-10-CM | POA: Diagnosis not present

## 2023-07-21 DIAGNOSIS — E7800 Pure hypercholesterolemia, unspecified: Secondary | ICD-10-CM | POA: Diagnosis not present

## 2023-07-21 DIAGNOSIS — K219 Gastro-esophageal reflux disease without esophagitis: Secondary | ICD-10-CM | POA: Diagnosis not present

## 2023-07-21 DIAGNOSIS — Z0000 Encounter for general adult medical examination without abnormal findings: Secondary | ICD-10-CM | POA: Diagnosis not present

## 2023-07-21 DIAGNOSIS — F419 Anxiety disorder, unspecified: Secondary | ICD-10-CM | POA: Diagnosis not present

## 2023-07-21 DIAGNOSIS — F5101 Primary insomnia: Secondary | ICD-10-CM | POA: Diagnosis not present

## 2023-07-21 LAB — MICROALBUMIN, URINE WAIVED
Creatinine, Urine Waived: 300 mg/dL (ref 10–300)
Microalb, Ur Waived: 150 mg/L — ABNORMAL HIGH (ref 0–19)

## 2023-07-21 MED ORDER — FLUOXETINE HCL 10 MG PO CAPS: 10.0000 mg | ORAL_CAPSULE | Freq: Every day | ORAL | 3 refills | Status: DC

## 2023-07-21 MED ORDER — FLUOXETINE HCL 40 MG PO CAPS: 40.0000 mg | ORAL_CAPSULE | Freq: Every day | ORAL | 4 refills | Status: DC

## 2023-07-21 NOTE — Assessment & Plan Note (Signed)
 Chronic, ongoing.  Will continue Trazodone 100 MG as needed at night, educated her on this medication use and side effects.  She tolerates well at baseline. ?Maintain a regular sleep schedule, particularly a regular wake-up time in the morning ?Try not to force sleep ?Avoid caffeinated beverages after lunch ?Avoid alcohol near bedtime (eg, late afternoon and evening)  ?Avoid smoking or other nicotine intake, particularly during the evening ?Adjust the bedroom environment as needed to decrease stimuli (eg, reduce ambient light, turn off the television or radio) ?Avoid prolonged use of light-emitting screens (laptops, tablets, smartphones, ebooks) before bedtime  ?Resolve concerns or worries before bedtime ?Exercise regularly for at least 20 minutes, preferably more than four to five hours prior to bedtime  ?Avoid daytime naps, especially if they are longer than 20 to 30 minutes or occur late in the day

## 2023-07-21 NOTE — Assessment & Plan Note (Signed)
 Chronic, exacerbated.  Denies SI/HI.  Recently lost mother. Will increase Prozac to 50 MG, which may offer her more benefit with her anxiety -- a 40 MG tablet and a 10 MG tablet -- we discussed this.  Discussed with her and educated.  Avoid Buspar, did not tolerate.

## 2023-07-21 NOTE — Assessment & Plan Note (Signed)
 Ongoing on labs.  Will  continue focus on diet and exercise at this time.  Labs today. The 10-year ASCVD risk score (Arnett DK, et al., 2019) is: 4.2%   Values used to calculate the score:     Age: 63 years     Sex: Female     Is Non-Hispanic African American: No     Diabetic: No     Tobacco smoker: No     Systolic Blood Pressure: 104 mmHg     Is BP treated: No     HDL Cholesterol: 54 mg/dL     Total Cholesterol: 293 mg/dL

## 2023-07-21 NOTE — Assessment & Plan Note (Signed)
 Noted on labs since 2018.  She has cut back on soda and OTC NSAIDs.  Consider nephrology referral if any worsening.  Check CMP, CBC today.  Continue Olmesartan for kidney protection, which she is tolerating.  Could also consider Marcelline Deist in future, which now has CKD coverage.

## 2023-07-21 NOTE — Assessment & Plan Note (Signed)
 Chronic, ongoing.  Continue current Levothyroxine dose and recheck TSH and Free T4 today.  Adjust dose as needed based on results.

## 2023-07-21 NOTE — Progress Notes (Signed)
 BP 104/67   Pulse 75   Temp 97.9 F (36.6 C) (Oral)   Ht 5\' 4"  (1.626 m)   Wt 148 lb (67.1 kg)   LMP  (LMP Unknown)   SpO2 96%   BMI 25.40 kg/m    Subjective:    Patient ID: Laura Soto, female    DOB: 1960/08/07, 63 y.o.   MRN: [id number]  HPI: Laura Soto is a 63 y.o. female presenting on 07/21/2023 for comprehensive medical examination. Current medical complaints include:none  She currently lives with: self Menopausal Symptoms: no  The 10-year ASCVD risk score (Arnett DK, et al., 2019) is: 4.2%   Values used to calculate the score:     Age: 15 years     Sex: Female     Is Non-Hispanic African American: No     Diabetic: No     Tobacco smoker: No     Systolic Blood Pressure: 104 mmHg     Is BP treated: No     HDL Cholesterol: 54 mg/dL     Total Cholesterol: 293 mg/dL  HYPOTHYROIDISM Takes Levothyroxine 112 MCG daily.  Continues on Omeprazole for GERD. Thyroid control status:stable Satisfied with current treatment? yes Medication side effects: no Medication compliance: good compliance Etiology of hypothyroidism: Hashimoto's Recent dose adjustment:no Fatigue: yes Cold intolerance: no Heat intolerance: no Weight gain: no Weight loss: no Constipation: no Diarrhea/loose stools: no Palpitations: no Lower extremity edema: no Anxiety/depressed mood: yes   CHRONIC KIDNEY DISEASE (3a) CKD status: stable Medications renally dose: yes Previous renal evaluation: no Pneumovax:  Not up to Date - refuses Influenza Vaccine:  Not up to Date -- refuses  DEPRESSION Continues on Prozac and Trazodone.  Her mother passed away on Feb 11, 2025she had been caregiver to her. Mood status: exacerbated Satisfied with current treatment?: yes Symptom severity: moderate  Duration of current treatment : chronic Side effects: no Medication compliance: good compliance Psychotherapy/counseling: none Depressed mood: yes Anxious mood: yes Anhedonia: no Significant weight loss  or gain: no Insomnia: if takes Trazodone sleeps well Fatigue: yes Feelings of worthlessness or guilt: no Impaired concentration/indecisiveness: no Suicidal ideations: no Hopelessness: no Crying spells: yes    07/21/2023    1:57 PM 11/11/2022   11:24 AM 10/11/2022   10:35 AM 09/11/2022    8:27 AM 06/06/2022   10:59 AM  Depression screen PHQ 2/9  Decreased Interest 0 0 0 2 0  Down, Depressed, Hopeless 1 0 0 2 0  PHQ - 2 Score 1 0 0 4 0  Altered sleeping 0 0 1 2 0  Tired, decreased energy 1 0 1 2 0  Change in appetite 0 0 1 0 0  Feeling bad or failure about yourself  0 0 0 0 0  Trouble concentrating 0 0 1 0 0  Moving slowly or fidgety/restless 0 0 0 0 0  Suicidal thoughts 0 0 0 0 0  PHQ-9 Score 2 0 4 8 0  Difficult doing work/chores Somewhat difficult Not difficult at all Somewhat difficult Somewhat difficult Not difficult at all      07/21/2023    1:58 PM 11/11/2022   11:25 AM 10/11/2022   10:36 AM 09/11/2022    8:29 AM  GAD 7 : Generalized Anxiety Score  Nervous, Anxious, on Edge 2 0 3 1  Control/stop worrying 1 0 3 1  Worry too much - different things 1 0 3 1  Trouble relaxing 0 0 3 3  Restless 0 0 3 3  Easily annoyed or irritable 2 0 2 1  Afraid - awful might happen 0 0 0 0  Total GAD 7 Score 6 0 17 10  Anxiety Difficulty Somewhat difficult Not difficult at all Somewhat difficult Somewhat difficult      06/06/2022   10:59 AM 09/11/2022    8:27 AM 10/11/2022   10:34 AM 11/11/2022   11:24 AM 07/21/2023    1:42 PM  Fall Risk  Falls in the past year? 0 0 0 0 0  Was there an injury with Fall? 0 0 0 0 0  Fall Risk Category Calculator 0 0 0 0 0  Fall Risk Category (Retired) Low      Patient at Risk for Falls Due to No Fall Risks No Fall Risks No Fall Risks No Fall Risks No Fall Risks  Fall risk Follow up Falls evaluation completed Falls evaluation completed Falls evaluation completed Falls evaluation completed Falls evaluation completed    Functional Status Survey: Is the  patient deaf or have difficulty hearing?: No Does the patient have difficulty seeing, even when wearing glasses/contacts?: No Does the patient have difficulty concentrating, remembering, or making decisions?: No Does the patient have difficulty walking or climbing stairs?: No Does the patient have difficulty dressing or bathing?: No Does the patient have difficulty doing errands alone such as visiting a doctor's office or shopping?: No   Past Medical History:  Past Medical History:  Diagnosis Date   Abnormal mammogram, unspecified    Breast screening, unspecified    Depression    GERD (gastroesophageal reflux disease)    Hypercalcemia    Hyperlipidemia    Hypothyroidism    Osteopenia    Personal history of tobacco use, presenting hazards to health     Surgical History:  Past Surgical History:  Procedure Laterality Date   COLONOSCOPY WITH PROPOFOL N/A 06/25/2017   Procedure: COLONOSCOPY WITH PROPOFOL;  Surgeon: Wyline Mood, MD;  Location: [facility] ENDOSCOPY;  Service: Gastroenterology;  Laterality: N/A;   ESOPHAGOGASTRODUODENOSCOPY (EGD) WITH PROPOFOL N/A 06/25/2017   Procedure: ESOPHAGOGASTRODUODENOSCOPY (EGD) WITH PROPOFOL;  Surgeon: Wyline Mood, MD;  Location: [facility] ENDOSCOPY;  Service: Gastroenterology;  Laterality: N/A;   WISDOM TOOTH EXTRACTION     Medications:  Current Outpatient Medications on File Prior to Visit  Medication Sig   levothyroxine (SYNTHROID) 112 MCG tablet Take 1 tablet (112 mcg total) by mouth daily before breakfast.   omeprazole (PRILOSEC) 20 MG capsule Take 1 capsule (20 mg total) by mouth daily.   traZODone (DESYREL) 100 MG tablet Take 1 tablet (100 mg total) by mouth at bedtime.   No current facility-administered medications on file prior to visit.    Allergies:  No Known Allergies  Social History:  Social History   Socioeconomic History   Marital status: Divorced    Spouse name: Not on file   Number of children: Not on file   Years of education:  Not on file   Highest education level: Not on file  Occupational History   Not on file  Tobacco Use   Smoking status: Former    Current packs/day: 0.00    Average packs/day: 0.3 packs/day for 20.0 years (5.0 ttl pk-yrs)    Types: Cigarettes    Start date: 12/26/1994    Quit date: 12/26/2014    Years since quitting: 8.5   Smokeless tobacco: Never  Vaping Use   Vaping status: Never Used  Substance and Sexual Activity   Alcohol use: Yes    Comment: occasionally - wine. none  last 24hrs   Drug use: No   Sexual activity: Never  Other Topics Concern   Not on file  Social History Narrative   Not on file   Social Drivers of Health   Financial Resource Strain: Not on file  Food Insecurity: Not on file  Transportation Needs: Not on file  Physical Activity: Not on file  Stress: Not on file  Social Connections: Not on file  Intimate Partner Violence: Not on file   Social History   Tobacco Use  Smoking Status Former   Current packs/day: 0.00   Average packs/day: 0.3 packs/day for 20.0 years (5.0 ttl pk-yrs)   Types: Cigarettes   Start date: 12/26/1994   Quit date: 12/26/2014   Years since quitting: 8.5  Smokeless Tobacco Never   Social History   Substance and Sexual Activity  Alcohol Use Yes   Comment: occasionally - wine. none last 24hrs    Family History:  Family History  Problem Relation Age of Onset   Stroke Mother    Atrial fibrillation Mother    Liver cancer Father        age 29   Diabetes Father     Past medical history, surgical history, medications, allergies, family history and social history reviewed with patient today and changes made to appropriate areas of the chart.   ROS All other ROS negative except what is listed above and in the HPI.      Objective:    BP 104/67   Pulse 75   Temp 97.9 F (36.6 C) (Oral)   Ht 5\' 4"  (1.626 m)   Wt 148 lb (67.1 kg)   LMP  (LMP Unknown)   SpO2 96%   BMI 25.40 kg/m   Wt Readings from Last 3 Encounters:   07/21/23 148 lb (67.1 kg)  02/14/23 142 lb 6.4 oz (64.6 kg)  04/25/22 146 lb 14.4 oz (66.6 kg)    Physical Exam Vitals and nursing note reviewed. Exam conducted with a chaperone present.  Constitutional:      General: She is awake. She is not in acute distress.    Appearance: She is well-developed and well-groomed. She is not ill-appearing or toxic-appearing.  HENT:     Head: Normocephalic and atraumatic.     Right Ear: Hearing, tympanic membrane, ear canal and external ear normal. No drainage.     Left Ear: Hearing, tympanic membrane, ear canal and external ear normal. No drainage.     Nose: Nose normal.     Right Sinus: No maxillary sinus tenderness or frontal sinus tenderness.     Left Sinus: No maxillary sinus tenderness or frontal sinus tenderness.     Mouth/Throat:     Mouth: Mucous membranes are moist.     Pharynx: Oropharynx is clear. Uvula midline. No pharyngeal swelling, oropharyngeal exudate or posterior oropharyngeal erythema.  Eyes:     General: Lids are normal.        Right eye: No discharge.        Left eye: No discharge.     Extraocular Movements: Extraocular movements intact.     Conjunctiva/sclera: Conjunctivae normal.     Pupils: Pupils are equal, round, and reactive to light.     Visual Fields: Right eye visual fields normal and left eye visual fields normal.  Neck:     Thyroid: No thyromegaly.     Vascular: No carotid bruit.     Trachea: Trachea normal.  Cardiovascular:     Rate and Rhythm: Normal rate  and regular rhythm.     Heart sounds: Normal heart sounds. No murmur heard.    No gallop.  Pulmonary:     Effort: Pulmonary effort is normal. No accessory muscle usage or respiratory distress.     Breath sounds: Normal breath sounds.  Chest:  Breasts:    Right: Normal.     Left: Normal.  Abdominal:     General: Bowel sounds are normal.     Palpations: Abdomen is soft. There is no hepatomegaly or splenomegaly.     Tenderness: There is no abdominal  tenderness.  Musculoskeletal:        General: Normal range of motion.     Cervical back: Normal range of motion and neck supple.     Right lower leg: No edema.     Left lower leg: No edema.  Lymphadenopathy:     Head:     Right side of head: No submental, submandibular, tonsillar, preauricular or posterior auricular adenopathy.     Left side of head: No submental, submandibular, tonsillar, preauricular or posterior auricular adenopathy.     Cervical: No cervical adenopathy.     Upper Body:     Right upper body: No supraclavicular, axillary or pectoral adenopathy.     Left upper body: No supraclavicular, axillary or pectoral adenopathy.  Skin:    General: Skin is warm and dry.     Capillary Refill: Capillary refill takes less than 2 seconds.     Findings: No rash.  Neurological:     Mental Status: She is alert and oriented to person, place, and time.     Gait: Gait is intact.     Deep Tendon Reflexes: Reflexes are normal and symmetric.     Reflex Scores:      Brachioradialis reflexes are 2+ on the right side and 2+ on the left side.      Patellar reflexes are 2+ on the right side and 2+ on the left side. Psychiatric:        Attention and Perception: Attention normal.        Mood and Affect: Mood normal.        Speech: Speech normal.        Behavior: Behavior normal. Behavior is cooperative.        Thought Content: Thought content normal.        Judgment: Judgment normal.    Results for orders placed or performed in visit on 02/14/23  TSH   Collection Time: 02/14/23  4:30 PM  Result Value Ref Range   TSH 1.000 0.450 - 4.500 uIU/mL  T4, free   Collection Time: 02/14/23  4:30 PM  Result Value Ref Range   Free T4 1.51 0.82 - 1.77 ng/dL      Assessment & Plan:   Problem List Items Addressed This Visit       Digestive   GERD (gastroesophageal reflux disease)   Chronic, ongoing.  Continue current medication regimen and adjust as needed.  Magnesium level today. Risks of PPI  use were discussed with patient including bone loss, C. Diff diarrhea, pneumonia, infections, CKD, electrolyte abnormalities.  Verbalizes understanding and chooses to continue the medication.       Relevant Orders   Magnesium     Endocrine   Hashimoto's thyroiditis   Chronic, ongoing.  Continue current Levothyroxine dose and recheck TSH and Free T4 today.  Adjust dose as needed based on results.        Relevant Orders   TSH  T4, free     Genitourinary   CKD (chronic kidney disease) stage 3, GFR 30-59 ml/min (HCC) - Primary   Noted on labs since 2018.  She has cut back on soda and OTC NSAIDs.  Consider nephrology referral if any worsening.  Check CMP, CBC today.  Continue Olmesartan for kidney protection, which she is tolerating.  Could also consider Marcelline Deist in future, which now has CKD coverage.        Relevant Orders   Comprehensive metabolic panel   CBC with Differential/Platelet   Microalbumin, Urine Waived     Other   Anxiety and depression   Chronic, exacerbated.  Denies SI/HI.  Recently lost mother. Will increase Prozac to 50 MG, which may offer her more benefit with her anxiety -- a 40 MG tablet and a 10 MG tablet -- we discussed this.  Discussed with her and educated.  Avoid Buspar, did not tolerate.      Relevant Medications   FLUoxetine (PROZAC) 10 MG capsule   FLUoxetine (PROZAC) 40 MG capsule   Elevated low density lipoprotein (LDL) cholesterol level   Ongoing on labs.  Will  continue focus on diet and exercise at this time.  Labs today. The 10-year ASCVD risk score (Arnett DK, et al., 2019) is: 4.2%   Values used to calculate the score:     Age: 33 years     Sex: Female     Is Non-Hispanic African American: No     Diabetic: No     Tobacco smoker: No     Systolic Blood Pressure: 104 mmHg     Is BP treated: No     HDL Cholesterol: 54 mg/dL     Total Cholesterol: 293 mg/dL       Relevant Orders   Comprehensive metabolic panel   Lipid Panel w/o Chol/HDL  Ratio   Insomnia   Chronic, ongoing.  Will continue Trazodone 100 MG as needed at night, educated her on this medication use and side effects.  She tolerates well at baseline. ?Maintain a regular sleep schedule, particularly a regular wake-up time in the morning ?Try not to force sleep ?Avoid caffeinated beverages after lunch ?Avoid alcohol near bedtime (eg, late afternoon and evening)  ?Avoid smoking or other nicotine intake, particularly during the evening ?Adjust the bedroom environment as needed to decrease stimuli (eg, reduce ambient light, turn off the television or radio) ?Avoid prolonged use of light-emitting screens (laptops, tablets, smartphones, ebooks) before bedtime  ?Resolve concerns or worries before bedtime ?Exercise regularly for at least 20 minutes, preferably more than four to five hours prior to bedtime  ?Avoid daytime naps, especially if they are longer than 20 to 30 minutes or occur late in the day       Other Visit Diagnoses       Encounter for screening mammogram for malignant neoplasm of breast       Mammogram ordered and instructed how to schedule.   Relevant Orders   MM 3D SCREENING MAMMOGRAM BILATERAL BREAST     Encounter for annual physical exam       Annual physical today with labs and health maintenance reviewed, discussed with patient.       Follow up plan: Return in about 8 weeks (around 09/15/2023) for ANXIETY.   LABORATORY TESTING:  - Pap smear: up to date  IMMUNIZATIONS:   - Tetanus vaccination status reviewed: last tetanus booster within 10 years. - Influenza: Refused - Pneumovax: Refused - Prevnar: Refused - COVID: Up to  date - HPV: Not applicable - Shingrix vaccine: Refused  SCREENING: -Mammogram: Ordered today  - Colonoscopy: Refused  - Bone Density: Not applicable  -Hearing Test: Not applicable  -Spirometry: Not applicable   PATIENT COUNSELING:   Advised to take 1 mg of folate supplement per day if capable of pregnancy.    Sexuality: Discussed sexually transmitted diseases, partner selection, use of condoms, avoidance of unintended pregnancy  and contraceptive alternatives.   Advised to avoid cigarette smoking.  I discussed with the patient that most people either abstain from alcohol or drink within safe limits (<=14/week and <=4 drinks/occasion for males, <=7/weeks and <= 3 drinks/occasion for females) and that the risk for alcohol disorders and other health effects rises proportionally with the number of drinks per week and how often a drinker exceeds daily limits.  Discussed cessation/primary prevention of drug use and availability of treatment for abuse.   Diet: Encouraged to adjust caloric intake to maintain  or achieve ideal body weight, to reduce intake of dietary saturated fat and total fat, to limit sodium intake by avoiding high sodium foods and not adding table salt, and to maintain adequate dietary potassium and calcium preferably from fresh fruits, vegetables, and low-fat dairy products.    Stressed the importance of regular exercise  Injury prevention: Discussed safety belts, safety helmets, smoke detector, smoking near bedding or upholstery.   Dental health: Discussed importance of regular tooth brushing, flossing, and dental visits.    NEXT PREVENTATIVE PHYSICAL DUE IN 1 YEAR. Return in about 8 weeks (around 09/15/2023) for ANXIETY.

## 2023-07-21 NOTE — Assessment & Plan Note (Signed)
Chronic, ongoing.  Continue current medication regimen and adjust as needed.   Magnesium level today. Risks of PPI use were discussed with patient including bone loss, C. Diff diarrhea, pneumonia, infections, CKD, electrolyte abnormalities.  Verbalizes understanding and chooses to continue the medication.

## 2023-07-22 ENCOUNTER — Encounter: Payer: Self-pay | Admitting: Nurse Practitioner

## 2023-07-22 LAB — COMPREHENSIVE METABOLIC PANEL
ALT: 16 [IU]/L (ref 0–32)
AST: 17 [IU]/L (ref 0–40)
Albumin: 4.2 g/dL (ref 3.9–4.9)
Alkaline Phosphatase: 116 [IU]/L (ref 44–121)
BUN/Creatinine Ratio: 14 (ref 12–28)
BUN: 20 mg/dL (ref 8–27)
Bilirubin Total: <0.2 mg/dL (ref 0.0–1.2)
CO2: 19 mmol/L — ABNORMAL LOW (ref 20–29)
Calcium: 9.4 mg/dL (ref 8.7–10.3)
Chloride: 105 mmol/L (ref 96–106)
Creatinine, Ser: 1.42 mg/dL — ABNORMAL HIGH (ref 0.57–1.00)
Globulin, Total: 2.6 g/dL (ref 1.5–4.5)
Glucose: 78 mg/dL (ref 70–99)
Potassium: 4.4 mmol/L (ref 3.5–5.2)
Sodium: 141 mmol/L (ref 134–144)
Total Protein: 6.8 g/dL (ref 6.0–8.5)
eGFR: 41 mL/min/{1.73_m2} — ABNORMAL LOW (ref >59)

## 2023-07-22 LAB — CBC WITH DIFFERENTIAL/PLATELET
Basophils Absolute: 0.1 10*3/uL (ref 0.0–0.2)
Basos: 1 %
EOS (ABSOLUTE): 0.2 10*3/uL (ref 0.0–0.4)
Eos: 2 %
Hematocrit: 38.1 % (ref 34.0–46.6)
Hemoglobin: 12.8 g/dL (ref 11.1–15.9)
Immature Grans (Abs): 0 10*3/uL (ref 0.0–0.1)
Immature Granulocytes: 0 %
Lymphocytes Absolute: 4.1 10*3/uL — ABNORMAL HIGH (ref 0.7–3.1)
Lymphs: 41 %
MCH: 29.9 pg (ref 26.6–33.0)
MCHC: 33.6 g/dL (ref 31.5–35.7)
MCV: 89 fL (ref 79–97)
Monocytes Absolute: 0.6 10*3/uL (ref 0.1–0.9)
Monocytes: 6 %
Neutrophils Absolute: 5 10*3/uL (ref 1.4–7.0)
Neutrophils: 50 %
Platelets: 236 10*3/uL (ref 150–450)
RBC: 4.28 x10E6/uL (ref 3.77–5.28)
RDW: 13.2 % (ref 11.7–15.4)
WBC: 9.9 10*3/uL (ref 3.4–10.8)

## 2023-07-22 LAB — LIPID PANEL W/O CHOL/HDL RATIO
Cholesterol, Total: 291 mg/dL — ABNORMAL HIGH (ref 100–199)
HDL: 42 mg/dL (ref >39)
LDL Chol Calc (NIH): 175 mg/dL — ABNORMAL HIGH (ref 0–99)
Triglycerides: 380 mg/dL — ABNORMAL HIGH (ref 0–149)
VLDL Cholesterol Cal: 74 mg/dL — ABNORMAL HIGH (ref 5–40)

## 2023-07-22 LAB — MAGNESIUM: Magnesium: 1.8 mg/dL (ref 1.6–2.3)

## 2023-07-22 LAB — T4, FREE: Free T4: 1.71 ng/dL (ref 0.82–1.77)

## 2023-07-22 LAB — TSH: TSH: 2.39 u[IU]/mL (ref 0.450–4.500)

## 2023-07-22 NOTE — Progress Notes (Signed)
 Contacted via MyChart The 10-year ASCVD risk score (Arnett DK, et al., 2019) is: 4.7%   Values used to calculate the score:     Age: 63 years     Sex: Female     Is Non-Hispanic African American: No     Diabetic: No     Tobacco smoker: No     Systolic Blood Pressure: 104 mmHg     Is BP treated: No     HDL Cholesterol: 42 mg/dL     Total Cholesterol: 291 mg/dL   Good evening Laura Soto, your labs have returned: - Kidney function continues to show Type 3a kidney diease. Next visit we will discuss some medication options to help keep kidneys safe as I feel you may benefit from something. - CBC shows no anemia or infection.  Your lymphocytes, one of your white blood cells, has been mildly elevated since 2018 but not trending up.  So we will continue to monitor this. - Thyroid levels are stable, continue current Levothyroxine dosing. - Lipid panel continues to show elevation.  Continue healthy diet focus at home and ensure regular exercise.  We will continue to monitor and start medication as needed.  Any questions? Keep being amazing!!  Thank you for allowing me to participate in your care.  I appreciate you. Kindest regards, Kordell Jafri

## 2023-07-22 NOTE — Progress Notes (Signed)
Contacted via MyChart

## 2023-09-13 NOTE — Patient Instructions (Signed)
 Managing Anxiety, Adult  After being diagnosed with anxiety, you may be relieved to know why you have felt or behaved a certain way. You may also feel overwhelmed about the treatment ahead and what it will mean for your life. With care and support, you can manage your anxiety.  How to manage lifestyle changes  Understanding the difference between stress and anxiety  Although stress can play a role in anxiety, it is not the same as anxiety. Stress is your body's reaction to life changes and events, both good and bad. Stress is often caused by something external, such as a deadline, test, or competition. It normally goes away after the event has ended and will last just a few hours. But, stress can be ongoing and can lead to more than just stress.  Anxiety is caused by something internal, such as imagining a terrible outcome or worrying that something will go wrong that will greatly upset you. Anxiety often does not go away even after the event is over, and it can become a long-term (chronic) worry.  Lowering stress and anxiety    Talk with your health care provider or a counselor to learn more about lowering anxiety and stress. They may suggest tension-reduction techniques, such as:  Music. Spend time creating or listening to music that you enjoy and that inspires you.  Mindfulness-based meditation. Practice being aware of your normal breaths while not trying to control your breathing. It can be done while sitting or walking.  Centering prayer. Focus on a word, phrase, or sacred image that means something to you and brings you peace.  Deep breathing. Expand your stomach and inhale slowly through your nose. Hold your breath for 3-5 seconds. Then breathe out slowly, letting your stomach muscles relax.  Self-talk. Learn to notice and spot thought patterns that lead to anxiety reactions. Change those patterns to thoughts that feel peaceful.  Muscle relaxation. Take time to tense muscles and then relax them.  Choose a  tension-reduction technique that fits your lifestyle and personality. These techniques take time and practice. Set aside 5-15 minutes a day to do them. Specialized therapists can offer counseling and training in these techniques. The training to help with anxiety may be covered by some insurance plans.  Other things you can do to manage stress and anxiety include:  Keeping a stress diary. This can help you learn what triggers your reaction and then learn ways to manage your response.  Thinking about how you react to certain situations. You may not be able to control everything, but you can control your response.  Making time for activities that help you relax and not feeling guilty about spending your time in this way.  Doing visual imagery. This involves imagining or creating mental pictures to help you relax.  Practicing yoga. Through yoga poses, you can lower tension and relax.     Medicines  Medicines for anxiety include:  Antidepressant medicines. These are usually prescribed for long-term daily control.  Anti-anxiety medicines. These may be added in severe cases, especially when panic attacks occur.  When used together, medicines, psychotherapy, and tension-reduction techniques may be the most effective treatment.  Relationships  Relationships can play a big part in helping you recover. Spend more time connecting with trusted friends and family members. Think about going to couples counseling if you have a partner, taking family education classes, or going to family therapy. Therapy can help you and others better understand your anxiety.  How to recognize changes in  your anxiety  Everyone responds differently to treatment for anxiety. Recovery from anxiety happens when symptoms lessen and stop interfering with your daily life at home or work. This may mean that you will start to:  Have better concentration and focus. Worry will interfere less in your daily thinking.  Sleep better.  Be less irritable.  Have  more energy.  Have improved memory.  Try to recognize when your condition is getting worse. Contact your provider if your symptoms interfere with home or work and you feel like your condition is not improving.  Follow these instructions at home:  Activity  Exercise. Adults should:  Exercise for at least 150 minutes each week. The exercise should increase your heart rate and make you sweat (moderate-intensity exercise).  Do strengthening exercises at least twice a week.  Get the right amount and quality of sleep. Most adults need 7-9 hours of sleep each night.  Lifestyle    Eat a healthy diet that includes plenty of vegetables, fruits, whole grains, low-fat dairy products, and lean protein.  Do not eat a lot of foods that are high in fats, added sugars, or salt (sodium).  Make choices that simplify your life.  Do not use any products that contain nicotine or tobacco. These products include cigarettes, chewing tobacco, and vaping devices, such as e-cigarettes. If you need help quitting, ask your provider.  Avoid caffeine, alcohol, and certain over-the-counter cold medicines. These may make you feel worse. Ask your pharmacist which medicines to avoid.  General instructions  Take over-the-counter and prescription medicines only as told by your provider.  Keep all follow-up visits. This is to make sure you are managing your anxiety well or if you need more support.  Where to find support  You can get help and support from:  Self-help groups.  Online and [employer].  A trusted spiritual leader.  Couples counseling.  Family education classes.  Family therapy.  Where to find more information  You may find that joining a support group helps you deal with your anxiety. The following sources can help you find counselors or support groups near you:  Mental Health America: mentalhealthamerica.net  Anxiety and Depression Association of [place] (ADAA): adaa.org  [employer] Mental Illness (NAMI):  nami.org  Contact a health care provider if:  You have a hard time staying focused or finishing tasks.  You spend many hours a day feeling worried about everyday life.  You are very tired because you cannot stop worrying.  You start to have headaches or often feel tense.  You have chronic nausea or diarrhea.  Get help right away if:  Your heart feels like it is racing.  You have shortness of breath.  You have thoughts of hurting yourself or others.  Get help right away if you feel like you may hurt yourself or others, or have thoughts about taking your own life. Go to your nearest emergency room or:  Call 911.  Call the National Suicide Prevention Lifeline at 2043122231 or 988. This is open 24 hours a day.  Text the Crisis Text Line at (832)170-7952.  This information is not intended to replace advice given to you by your health care provider. Make sure you discuss any questions you have with your health care provider.  Document Revised: 02/19/2022 Document Reviewed: 09/03/2020  Elsevier Patient Education  2024 [employer].

## 2023-09-16 ENCOUNTER — Encounter: Payer: Self-pay | Admitting: Nurse Practitioner

## 2023-09-16 ENCOUNTER — Telehealth (INDEPENDENT_AMBULATORY_CARE_PROVIDER_SITE_OTHER): Payer: Medicaid Other | Admitting: Nurse Practitioner

## 2023-09-16 DIAGNOSIS — F32A Depression, unspecified: Secondary | ICD-10-CM | POA: Diagnosis not present

## 2023-09-16 DIAGNOSIS — F419 Anxiety disorder, unspecified: Secondary | ICD-10-CM | POA: Diagnosis not present

## 2023-09-16 DIAGNOSIS — G4700 Insomnia, unspecified: Secondary | ICD-10-CM

## 2023-09-16 DIAGNOSIS — F5101 Primary insomnia: Secondary | ICD-10-CM

## 2023-09-16 MED ORDER — FLUOXETINE HCL 20 MG PO CAPS
20.0000 mg | ORAL_CAPSULE | Freq: Every day | ORAL | 2 refills | Status: DC
Start: 2023-09-16 — End: 2023-11-19

## 2023-09-16 NOTE — Assessment & Plan Note (Signed)
 Chronic, improving and would like to lower dosing.  Denies SI/HI.  Recently lost mother. Will reduce Prozac  to 20 MG daily and if any issues with this present to alert provider,  Do not cold [place] stop medication.  Discussed with her and educated.  Avoid Buspar , did not tolerate.

## 2023-09-16 NOTE — Assessment & Plan Note (Signed)
 Chronic, ongoing.  Will continue Trazodone 100 MG as needed at night, educated her on this medication use and side effects.  She tolerates well at baseline. ?Maintain a regular sleep schedule, particularly a regular wake-up time in the morning ?Try not to force sleep ?Avoid caffeinated beverages after lunch ?Avoid alcohol near bedtime (eg, late afternoon and evening)  ?Avoid smoking or other nicotine intake, particularly during the evening ?Adjust the bedroom environment as needed to decrease stimuli (eg, reduce ambient light, turn off the television or radio) ?Avoid prolonged use of light-emitting screens (laptops, tablets, smartphones, ebooks) before bedtime  ?Resolve concerns or worries before bedtime ?Exercise regularly for at least 20 minutes, preferably more than four to five hours prior to bedtime  ?Avoid daytime naps, especially if they are longer than 20 to 30 minutes or occur late in the day

## 2023-09-16 NOTE — Progress Notes (Signed)
 LMP  (LMP Unknown)    Subjective:    Patient ID: Laura Soto, female    DOB: July 24, 1960, 63 y.o.   MRN: [id number]  HPI: Laura Soto is a 63 y.o. female  Chief Complaint  Patient presents with   Anxiety   Virtual Visit via Video Note  I connected with Laura Soto on 10-16-2023 at 10:20 AM EDT by a video enabled telemedicine application and verified that I am speaking with the correct person using two identifiers.  Location: Patient: home Provider: office   I discussed the limitations of evaluation and management by telemedicine and the availability of in person appointments. The patient expressed understanding and agreed to proceed.  I discussed the assessment and treatment plan with the patient. The patient was provided an opportunity to ask questions and all were answered. The patient agreed with the plan and demonstrated an understanding of the instructions.   The patient was advised to call back or seek an in-person evaluation if the symptoms worsen or if the condition fails to improve as anticipated.  I provided 25 minutes of non-face-to-face time during this encounter.   Eldred Lievanos T Alaysha Jefcoat, NP   ANXIETY/STRESS Follow-up today for anxiety with exacerbation due to losing mother recently.  At recent visit increase Prozac  to 50 MG, small increase per patient request.  In past took Buspar  but did not tolerate this.  Continues Trazodone  for sleep.  She reduced her Prozac  to 40 MG recently, but does not feel she needs this much anymore. Duration:stable Anxious mood: no  Excessive worrying: no Irritability: no  Sweating: no Nausea: no Palpitations:no Hyperventilation: no Panic attacks: no Agoraphobia: no  Obscessions/compulsions: no Depressed mood: occasional    10/16/23    9:37 AM 07/21/2023    1:57 PM 11/11/2022   11:24 AM 10/11/2022   10:35 AM 09/11/2022    8:27 AM  Depression screen PHQ 2/9  Decreased Interest 1 0 0 0 2  Down, Depressed, Hopeless 0 1 0 0 2   PHQ - 2 Score 1 1 0 0 4  Altered sleeping 1 0 0 1 2  Tired, decreased energy 1 1 0 1 2  Change in appetite 0 0 0 1 0  Feeling bad or failure about yourself  0 0 0 0 0  Trouble concentrating 0 0 0 1 0  Moving slowly or fidgety/restless 0 0 0 0 0  Suicidal thoughts 0 0 0 0 0  PHQ-9 Score 3 2 0 4 8  Difficult doing work/chores Somewhat difficult Somewhat difficult Not difficult at all Somewhat difficult Somewhat difficult  Anhedonia: no Weight changes: no Insomnia: none Hypersomnia: no Fatigue/loss of energy: no Feelings of worthlessness: no Feelings of guilt: no Impaired concentration/indecisiveness: no Suicidal ideations: no  Crying spells: no Recent Stressors/Life Changes: no   Relationship problems: no   Family stress: no     Financial stress: no    Job stress: no    Recent death/loss: no     10/16/2023    9:39 AM 07/21/2023    1:58 PM 11/11/2022   11:25 AM 10/11/2022   10:36 AM  GAD 7 : Generalized Anxiety Score  Nervous, Anxious, on Edge 0 2 0 3  Control/stop worrying 1 1 0 3  Worry too much - different things 1 1 0 3  Trouble relaxing 1 0 0 3  Restless 1 0 0 3  Easily annoyed or irritable 1 2 0 2  Afraid - awful might happen 0 0  0 0  Total GAD 7 Score 5 6 0 17  Anxiety Difficulty Somewhat difficult Somewhat difficult Not difficult at all Somewhat difficult      Relevant past medical, surgical, family and social history reviewed and updated as indicated. Interim medical history since our last visit reviewed. Allergies and medications reviewed and updated.  Review of Systems  Constitutional:  Negative for activity change, appetite change, diaphoresis, fatigue and fever.  Respiratory:  Negative for cough, chest tightness and shortness of breath.   Cardiovascular:  Negative for chest pain, palpitations and leg swelling.  Gastrointestinal: Negative.   Neurological: Negative.   Psychiatric/Behavioral:  Negative for decreased concentration, self-injury, sleep  disturbance and suicidal ideas. The patient is not nervous/anxious.     Per HPI unless specifically indicated above     Objective:    LMP  (LMP Unknown)   Wt Readings from Last 3 Encounters:  07/21/23 148 lb (67.1 kg)  02/14/23 142 lb 6.4 oz (64.6 kg)  04/25/22 146 lb 14.4 oz (66.6 kg)    Physical Exam Vitals and nursing note reviewed.  Constitutional:      General: She is awake. She is not in acute distress.    Appearance: She is well-developed. She is not ill-appearing.  HENT:     Head: Normocephalic.     Right Ear: Hearing normal.     Left Ear: Hearing normal.  Eyes:     General: Lids are normal.        Right eye: No discharge.        Left eye: No discharge.     Conjunctiva/sclera: Conjunctivae normal.  Pulmonary:     Effort: Pulmonary effort is normal. No accessory muscle usage or respiratory distress.  Musculoskeletal:     Cervical back: Normal range of motion.  Neurological:     Mental Status: She is alert and oriented to person, place, and time.  Psychiatric:        Attention and Perception: Attention normal.        Mood and Affect: Mood normal.        Behavior: Behavior normal. Behavior is cooperative.        Thought Content: Thought content normal.        Judgment: Judgment normal.     Results for orders placed or performed in visit on 07/21/23  Comprehensive metabolic panel   Collection Time: 07/21/23  2:11 PM  Result Value Ref Range   Glucose 78 70 - 99 mg/dL   BUN 20 8 - 27 mg/dL   Creatinine, Ser 1.61 (H) 0.57 - 1.00 mg/dL   eGFR 41 (L) >09 UE/AVW/0.98   BUN/Creatinine Ratio 14 12 - 28   Sodium 141 134 - 144 mmol/L   Potassium 4.4 3.5 - 5.2 mmol/L   Chloride 105 96 - 106 mmol/L   CO2 19 (L) 20 - 29 mmol/L   Calcium 9.4 8.7 - 10.3 mg/dL   Total Protein 6.8 6.0 - 8.5 g/dL   Albumin 4.2 3.9 - 4.9 g/dL   Globulin, Total 2.6 1.5 - 4.5 g/dL   Bilirubin Total <1.1 0.0 - 1.2 mg/dL   Alkaline Phosphatase 116 44 - 121 IU/L   AST 17 0 - 40 IU/L   ALT  16 0 - 32 IU/L  CBC with Differential/Platelet   Collection Time: 07/21/23  2:11 PM  Result Value Ref Range   WBC 9.9 3.4 - 10.8 x10E3/uL   RBC 4.28 3.77 - 5.28 x10E6/uL   Hemoglobin 12.8 11.1 - 15.9  g/dL   Hematocrit 82.9 56.2 - 46.6 %   MCV 89 79 - 97 fL   MCH 29.9 26.6 - 33.0 pg   MCHC 33.6 31.5 - 35.7 g/dL   RDW 13.0 86.5 - 78.4 %   Platelets 236 150 - 450 x10E3/uL   Neutrophils 50 Not Estab. %   Lymphs 41 Not Estab. %   Monocytes 6 Not Estab. %   Eos 2 Not Estab. %   Basos 1 Not Estab. %   Neutrophils Absolute 5.0 1.4 - 7.0 x10E3/uL   Lymphocytes Absolute 4.1 (H) 0.7 - 3.1 x10E3/uL   Monocytes Absolute 0.6 0.1 - 0.9 x10E3/uL   EOS (ABSOLUTE) 0.2 0.0 - 0.4 x10E3/uL   Basophils Absolute 0.1 0.0 - 0.2 x10E3/uL   Immature Granulocytes 0 Not Estab. %   Immature Grans (Abs) 0.0 0.0 - 0.1 x10E3/uL  Lipid Panel w/o Chol/HDL Ratio   Collection Time: 07/21/23  2:11 PM  Result Value Ref Range   Cholesterol, Total 291 (H) 100 - 199 mg/dL   Triglycerides 696 (H) 0 - 149 mg/dL   HDL 42 >29 mg/dL   VLDL Cholesterol Cal 74 (H) 5 - 40 mg/dL   LDL Chol Calc (NIH) 528 (H) 0 - 99 mg/dL   LDL CALC COMMENT: Comment   TSH   Collection Time: 07/21/23  2:11 PM  Result Value Ref Range   TSH 2.390 0.450 - 4.500 uIU/mL  Magnesium   Collection Time: 07/21/23  2:11 PM  Result Value Ref Range   Magnesium 1.8 1.6 - 2.3 mg/dL  Microalbumin, Urine Waived   Collection Time: 07/21/23  2:11 PM  Result Value Ref Range   Microalb, Ur Waived 150 (H) 0 - 19 mg/L   Creatinine, Urine Waived 300 10 - 300 mg/dL   Microalb/Creat Ratio 30-300 (H) <30 mg/g  T4, free   Collection Time: 07/21/23  2:11 PM  Result Value Ref Range   Free T4 1.71 0.82 - 1.77 ng/dL      Assessment & Plan:   Problem List Items Addressed This Visit       Other   Insomnia   Chronic, ongoing.  Will continue Trazodone  100 MG as needed at night, educated her on this medication use and side effects.  She tolerates well at  baseline. ?Maintain a regular sleep schedule, particularly a regular wake-up time in the morning ?Try not to force sleep ?Avoid caffeinated beverages after lunch ?Avoid alcohol near bedtime (eg, late afternoon and evening)  ?Avoid smoking or other nicotine intake, particularly during the evening ?Adjust the bedroom environment as needed to decrease stimuli (eg, reduce ambient light, turn off the television or radio) ?Avoid prolonged use of light-emitting screens (laptops, tablets, smartphones, ebooks) before bedtime  ?Resolve concerns or worries before bedtime ?Exercise regularly for at least 20 minutes, preferably more than four to five hours prior to bedtime  ?Avoid daytime naps, especially if they are longer than 20 to 30 minutes or occur late in the day       Anxiety and depression - Primary   Chronic, improving and would like to lower dosing.  Denies SI/HI.  Recently lost mother. Will reduce Prozac  to 20 MG daily and if any issues with this present to alert provider,  Do not cold [place] stop medication.  Discussed with her and educated.  Avoid Buspar , did not tolerate.      Relevant Medications   FLUoxetine  (PROZAC ) 20 MG capsule     Follow up plan: Return in  about 5 months (around 02/16/2024) for Hypothyroid, HTN/HLD, ANXIETY.

## 2023-09-25 NOTE — Progress Notes (Signed)
 Appointment has been made a reminder has been mailed

## 2023-10-14 ENCOUNTER — Encounter: Payer: Self-pay | Admitting: Nurse Practitioner

## 2023-11-19 ENCOUNTER — Ambulatory Visit (INDEPENDENT_AMBULATORY_CARE_PROVIDER_SITE_OTHER): Admitting: Nurse Practitioner

## 2023-11-19 ENCOUNTER — Ambulatory Visit: Payer: Self-pay

## 2023-11-19 ENCOUNTER — Encounter: Payer: Self-pay | Admitting: Nurse Practitioner

## 2023-11-19 VITALS — BP 100/64 | HR 71 | Temp 98.1°F | Ht 64.0 in | Wt 158.6 lb

## 2023-11-19 DIAGNOSIS — F5101 Primary insomnia: Secondary | ICD-10-CM

## 2023-11-19 DIAGNOSIS — J0110 Acute frontal sinusitis, unspecified: Secondary | ICD-10-CM

## 2023-11-19 MED ORDER — ALBUTEROL SULFATE HFA 108 (90 BASE) MCG/ACT IN AERS
2.0000 | INHALATION_SPRAY | Freq: Four times a day (QID) | RESPIRATORY_TRACT | 0 refills | Status: DC | PRN
Start: 2023-11-19 — End: 2024-02-10

## 2023-11-19 MED ORDER — TRAZODONE HCL 50 MG PO TABS: 50.0000 mg | ORAL_TABLET | Freq: Every evening | ORAL | 2 refills | Status: DC | PRN

## 2023-11-19 MED ORDER — AZITHROMYCIN 250 MG PO TABS: ORAL_TABLET | ORAL | 0 refills | Status: AC

## 2023-11-19 NOTE — Telephone Encounter (Addendum)
 FYI Only or Action Required?: FYI only for provider.  Patient was last seen in primary care on 09/16/2023 by Cannady, Jolene T, NP. Called Nurse Triage reporting URI. Symptoms began a week ago. Interventions attempted: OTC medications: allergy medication and netipot. Symptoms are: unchanged.  Triage Disposition: See PCP When Office is Open (Within 3 Days)  Patient/caregiver understands and will follow disposition?: Yes  Appt today with PCP [id number]  Copied from CRM [phone]. Topic: Clinical - Red Word Triage >> Nov 19, 2023  8:39 AM Selinda RAMAN wrote: Red Word that prompted transfer to Nurse Triage: The patient called in stating she has been sneezing a little bit, had a cough that is deep in her chest, her eyelids are swollen and she has bad wheezing. I will transfer her to E2C2 NT Answer Assessment - Initial Assessment Questions 1. LOCATION: Where does it hurt?      Facial pain 2. ONSET: When did the sinus pain start?  (e.g., hours, days)      1.5 week 5. NASAL CONGESTION: Is the nose blocked? If Yes, ask: Can you open it or must you breathe through your mouth?     yes 6. NASAL DISCHARGE: Do you have discharge from your nose? If so ask, What color?     Yes, clear  8. OTHER SYMPTOMS: Do you have any other symptoms? (e.g., sore throat, cough, earache, difficulty breathing)     Wheezing, eyes swelling, cough  Protocols used: Sinus Pain or Congestion-A-AH

## 2023-11-19 NOTE — Telephone Encounter (Signed)
 Reason for Disposition . [1] Nasal discharge AND [2] present > 10 days  Protocols used: Sinus Pain or Congestion-A-AH

## 2023-11-19 NOTE — Patient Instructions (Addendum)
 Levocetirizine (Xyzal) and Cetirizine (Zyrtec)  Upper Respiratory Infection, Adult An upper respiratory infection (URI) affects the nose, throat, and upper airways that lead to the lungs. The most common type of URI is often called the common cold. URIs usually get better on their own, without medical treatment. What are the causes? A URI is caused by a germ (virus). You may catch these germs by: Breathing in droplets from an infected person's cough or sneeze. Touching something that has the germ on it (is contaminated) and then touching your mouth, nose, or eyes. What increases the risk? You are more likely to get a URI if: You are very young or very old. You have close contact with others, such as at work, school, or a health care facility. You smoke. You have long-term (chronic) heart or lung disease. You have a weakened disease-fighting system (immune system). You have nasal allergies or asthma. You have a lot of stress. You have poor nutrition. What are the signs or symptoms? Runny or stuffy (congested) nose. Cough. Sneezing. Sore throat. Headache. Feeling tired (fatigue). Fever. Not wanting to eat as much as usual. Pain in your forehead, behind your eyes, and over your cheekbones (sinus pain). Muscle aches. Redness or irritation of the eyes. Pressure in the ears or face. How is this treated? URIs usually get better on their own within 7-10 days. Medicines cannot cure URIs, but your doctor may recommend certain medicines to help relieve symptoms, such as: Over-the-counter cold medicines. Medicines to reduce coughing (cough suppressants). Coughing is a type of defense against infection that helps to clear the nose, throat, windpipe, and lungs (respiratory system). Take these medicines only as told by your doctor. Medicines to lower your fever. Follow these instructions at home: Activity Rest as needed. If you have a fever, stay home from work or school until your fever is  gone, or until your doctor says you may return to work or school. You should stay home until you cannot spread the infection anymore (you are not contagious). Your doctor may have you wear a face mask so you have less risk of spreading the infection. Relieving symptoms Rinse your mouth often with salt water. To make salt water, dissolve -1 tsp (3-6 g) of salt in 1 cup (237 mL) of warm water. Use a cool-mist humidifier to add moisture to the air. This can help you breathe more easily. Eating and drinking  Drink enough fluid to keep your pee (urine) pale yellow. Eat soups and other clear broths. General instructions  Take over-the-counter and prescription medicines only as told by your doctor. Do not smoke or use any products that contain nicotine or tobacco. If you need help quitting, ask your doctor. Avoid being where people are smoking (avoid secondhand smoke). Stay up to date on all your shots (immunizations), and get the flu shot every year. Keep all follow-up visits. How to prevent the spread of infection to others  Wash your hands with soap and water for at least 20 seconds. If you cannot use soap and water, use hand sanitizer. Avoid touching your mouth, face, eyes, or nose. Cough or sneeze into a tissue or your sleeve or elbow. Do not cough or sneeze into your hand or into the air. Contact a doctor if: You are getting worse, not better. You have any of these: A fever or chills. Brown or red mucus in your nose. Yellow or brown fluid (discharge)coming from your nose. Pain in your face, especially when you bend forward. Swollen  neck glands. Pain when you swallow. White areas in the back of your throat. Get help right away if: You have shortness of breath that gets worse. You have very bad or constant: Headache. Ear pain. Pain in your forehead, behind your eyes, and over your cheekbones (sinus pain). Chest pain. You have long-lasting (chronic) lung disease along with any of  these: Making high-pitched whistling sounds when you breathe, most often when you breathe out (wheezing). Long-lasting cough (more than 14 days). Coughing up blood. A change in your usual mucus. You have a stiff neck. You have changes in your: Vision. Hearing. Thinking. Mood. These symptoms may be an emergency. Get help right away. Call 911. Do not wait to see if the symptoms will go away. Do not drive yourself to the hospital. Summary An upper respiratory infection (URI) is caused by a germ (virus). The most common type of URI is often called the common cold. URIs usually get better within 7-10 days. Take over-the-counter and prescription medicines only as told by your doctor. This information is not intended to replace advice given to you by your health care provider. Make sure you discuss any questions you have with your health care provider. Document Revised: 12/13/2020 Document Reviewed: 12/13/2020 Elsevier Patient Education  2024 [employer].

## 2023-11-19 NOTE — Assessment & Plan Note (Signed)
 Acute for 1 1/2 to 2 weeks with no improvement.  Sent in Zpack and Albuterol  to use as needed for wheezing.  Recommend: - Increased rest - Increasing Fluids - Acetaminophen as needed for fever/pain.  - Salt water gargling, chloraseptic spray and throat lozenges - Mucinex.  - Saline sinus flushes or a neti pot.  - Humidifying the air.

## 2023-11-19 NOTE — Assessment & Plan Note (Signed)
 Chronic, ongoing.  Will reduce Trazodone  to 50 MG as needed at night, educated her on this medication use and side effects.  She tolerates well at baseline. ?Maintain a regular sleep schedule, particularly a regular wake-up time in the morning ?Try not to force sleep ?Avoid caffeinated beverages after lunch ?Avoid alcohol near bedtime (eg, late afternoon and evening)  ?Avoid smoking or other nicotine intake, particularly during the evening ?Adjust the bedroom environment as needed to decrease stimuli (eg, reduce ambient light, turn off the television or radio) ?Avoid prolonged use of light-emitting screens (laptops, tablets, smartphones, ebooks) before bedtime  ?Resolve concerns or worries before bedtime ?Exercise regularly for at least 20 minutes, preferably more than four to five hours prior to bedtime  ?Avoid daytime naps, especially if they are longer than 20 to 30 minutes or occur late in the day

## 2023-11-19 NOTE — Progress Notes (Signed)
 BP 100/64   Pulse 71   Temp 98.1 F (36.7 C) (Oral)   Ht 5' 4 (1.626 m)   Wt 158 lb 9.6 oz (71.9 kg)   LMP  (LMP Unknown)   SpO2 98%   BMI 27.22 kg/m    Subjective:    Patient ID: Laura Soto, female    DOB: 08/15/1960, 63 y.o.   MRN: [id number]  HPI: Laura Soto is a 63 y.o. female  Chief Complaint  Patient presents with   Chest Congestion    Patient states she has been having chest congestion, cough, and wheezing for the last week and a half.    Insomnia    Patient wants to discuss dropping her Trazadone down to 50 mg, states 100 mg is too much   UPPER RESPIRATORY TRACT INFECTION Has been sick for 1 1/2 weeks or more.  Started out as allergies, but has progressed. Fever: no Cough: yes Shortness of breath: no Wheezing: yes Chest pain: no Chest tightness: yes Chest congestion: yes Nasal congestion: yes Runny nose: yes Post nasal drip: yes Sneezing: no Sore throat: no Swollen glands: no Sinus pressure: yes Headache: yes Face pain: yes Toothache: no Ear pain: none Ear pressure: yes bilateral Eyes red/itching:yes Eye drainage/crusting: no  Vomiting: no Rash: no Fatigue: yes Sick contacts: no Strep contacts: no  Context: fluctuating Recurrent sinusitis: no Relief with OTC cold/cough medications: no  Treatments attempted: not at present -- allergy medications did not help    INSOMNIA Has reduced her Trazodone  to 50 MG, as 100 MG makes her too sleepy. Only takes as needed. Duration: chronic Satisfied with sleep quality: yes Difficulty falling asleep: yes Difficulty staying asleep: no Waking a few hours after sleep onset: no Early morning awakenings: no Daytime hypersomnolence: no Wakes feeling refreshed: no Good sleep hygiene: no Apnea: no Snoring: no Depressed/anxious mood: no Recent stress: no Restless legs/nocturnal leg cramps: no Chronic pain/arthritis: no History of sleep study: no Treatments attempted: Trazodone     Relevant past  medical, surgical, family and social history reviewed and updated as indicated. Interim medical history since our last visit reviewed. Allergies and medications reviewed and updated.  Review of Systems  Constitutional:  Positive for fatigue. Negative for activity change, appetite change, chills and fever.  HENT:  Positive for congestion, postnasal drip, rhinorrhea and sinus pressure. Negative for ear discharge, ear pain, facial swelling, sinus pain, sneezing, sore throat and voice change.   Respiratory:  Positive for cough, chest tightness and wheezing. Negative for shortness of breath.   Cardiovascular:  Negative for chest pain, palpitations and leg swelling.  Gastrointestinal: Negative.   Musculoskeletal:  Positive for myalgias.  Neurological:  Positive for headaches. Negative for dizziness and numbness.  Psychiatric/Behavioral: Negative.     Per HPI unless specifically indicated above     Objective:    BP 100/64   Pulse 71   Temp 98.1 F (36.7 C) (Oral)   Ht 5' 4 (1.626 m)   Wt 158 lb 9.6 oz (71.9 kg)   LMP  (LMP Unknown)   SpO2 98%   BMI 27.22 kg/m   Wt Readings from Last 3 Encounters:  11/19/23 158 lb 9.6 oz (71.9 kg)  07/21/23 148 lb (67.1 kg)  02/14/23 142 lb 6.4 oz (64.6 kg)    Physical Exam Vitals and nursing note reviewed.  Constitutional:      General: She is awake. She is not in acute distress.    Appearance: She is well-developed and well-groomed.  She is not ill-appearing or toxic-appearing.  HENT:     Head: Normocephalic.     Right Ear: Hearing, ear canal and external ear normal. A middle ear effusion is present. Tympanic membrane is not injected or perforated.     Left Ear: Hearing, ear canal and external ear normal. A middle ear effusion is present. Tympanic membrane is not injected or perforated.     Nose: Rhinorrhea present. Rhinorrhea is clear.     Right Sinus: Frontal sinus tenderness present. No maxillary sinus tenderness.     Left Sinus: Frontal  sinus tenderness present. No maxillary sinus tenderness.     Mouth/Throat:     Mouth: Mucous membranes are moist.     Pharynx: Posterior oropharyngeal erythema (mild) present. No pharyngeal swelling or oropharyngeal exudate.   Eyes:     General: Lids are normal.        Right eye: No discharge.        Left eye: No discharge.     Conjunctiva/sclera: Conjunctivae normal.     Pupils: Pupils are equal, round, and reactive to light.   Neck:     Thyroid : No thyromegaly.     Vascular: No carotid bruit.   Cardiovascular:     Rate and Rhythm: Normal rate and regular rhythm.     Heart sounds: Normal heart sounds. No murmur heard.    No gallop.  Pulmonary:     Effort: Pulmonary effort is normal. No accessory muscle usage or respiratory distress.     Breath sounds: Wheezing (occasional expiratory wheezing) present. No decreased breath sounds or rales.  Abdominal:     General: Bowel sounds are normal.     Palpations: Abdomen is soft. There is no hepatomegaly or splenomegaly.   Musculoskeletal:     Cervical back: Normal range of motion and neck supple.     Right lower leg: No edema.     Left lower leg: No edema.  Lymphadenopathy:     Head:     Right side of head: Submandibular and tonsillar adenopathy present. No submental, preauricular or posterior auricular adenopathy.     Left side of head: Submandibular and tonsillar adenopathy present. No submental, preauricular or posterior auricular adenopathy.     Cervical: No cervical adenopathy.   Skin:    General: Skin is warm and dry.   Neurological:     Mental Status: She is alert and oriented to person, place, and time.   Psychiatric:        Attention and Perception: Attention normal.        Mood and Affect: Mood normal.        Speech: Speech normal.        Behavior: Behavior normal. Behavior is cooperative.        Thought Content: Thought content normal.     Results for orders placed or performed in visit on 07/21/23   Comprehensive metabolic panel   Collection Time: 07/21/23  2:11 PM  Result Value Ref Range   Glucose 78 70 - 99 mg/dL   BUN 20 8 - 27 mg/dL   Creatinine, Ser 8.57 (H) 0.57 - 1.00 mg/dL   eGFR 41 (L) >40 fO/fpw/8.26   BUN/Creatinine Ratio 14 12 - 28   Sodium 141 134 - 144 mmol/L   Potassium 4.4 3.5 - 5.2 mmol/L   Chloride 105 96 - 106 mmol/L   CO2 19 (L) 20 - 29 mmol/L   Calcium 9.4 8.7 - 10.3 mg/dL   Total Protein 6.8 6.0 -  8.5 g/dL   Albumin 4.2 3.9 - 4.9 g/dL   Globulin, Total 2.6 1.5 - 4.5 g/dL   Bilirubin Total <9.7 0.0 - 1.2 mg/dL   Alkaline Phosphatase 116 44 - 121 IU/L   AST 17 0 - 40 IU/L   ALT 16 0 - 32 IU/L  CBC with Differential/Platelet   Collection Time: 07/21/23  2:11 PM  Result Value Ref Range   WBC 9.9 3.4 - 10.8 x10E3/uL   RBC 4.28 3.77 - 5.28 x10E6/uL   Hemoglobin 12.8 11.1 - 15.9 g/dL   Hematocrit 61.8 65.9 - 46.6 %   MCV 89 79 - 97 fL   MCH 29.9 26.6 - 33.0 pg   MCHC 33.6 31.5 - 35.7 g/dL   RDW 86.7 88.2 - 84.5 %   Platelets 236 150 - 450 x10E3/uL   Neutrophils 50 Not Estab. %   Lymphs 41 Not Estab. %   Monocytes 6 Not Estab. %   Eos 2 Not Estab. %   Basos 1 Not Estab. %   Neutrophils Absolute 5.0 1.4 - 7.0 x10E3/uL   Lymphocytes Absolute 4.1 (H) 0.7 - 3.1 x10E3/uL   Monocytes Absolute 0.6 0.1 - 0.9 x10E3/uL   EOS (ABSOLUTE) 0.2 0.0 - 0.4 x10E3/uL   Basophils Absolute 0.1 0.0 - 0.2 x10E3/uL   Immature Granulocytes 0 Not Estab. %   Immature Grans (Abs) 0.0 0.0 - 0.1 x10E3/uL  Lipid Panel w/o Chol/HDL Ratio   Collection Time: 07/21/23  2:11 PM  Result Value Ref Range   Cholesterol, Total 291 (H) 100 - 199 mg/dL   Triglycerides 619 (H) 0 - 149 mg/dL   HDL 42 >60 mg/dL   VLDL Cholesterol Cal 74 (H) 5 - 40 mg/dL   LDL Chol Calc (NIH) 824 (H) 0 - 99 mg/dL   LDL CALC COMMENT: Comment   TSH   Collection Time: 07/21/23  2:11 PM  Result Value Ref Range   TSH 2.390 0.450 - 4.500 uIU/mL  Magnesium   Collection Time: 07/21/23  2:11 PM  Result Value  Ref Range   Magnesium 1.8 1.6 - 2.3 mg/dL  Microalbumin, Urine Waived   Collection Time: 07/21/23  2:11 PM  Result Value Ref Range   Microalb, Ur Waived 150 (H) 0 - 19 mg/L   Creatinine, Urine Waived 300 10 - 300 mg/dL   Microalb/Creat Ratio 30-300 (H) <30 mg/g  T4, free   Collection Time: 07/21/23  2:11 PM  Result Value Ref Range   Free T4 1.71 0.82 - 1.77 ng/dL      Assessment & Plan:   Problem List Items Addressed This Visit       Respiratory   Sinus infection - Primary   Acute for 1 1/2 to 2 weeks with no improvement.  Sent in Zpack and Albuterol  to use as needed for wheezing.  Recommend: - Increased rest - Increasing Fluids - Acetaminophen as needed for fever/pain.  - Salt water gargling, chloraseptic spray and throat lozenges - Mucinex.  - Saline sinus flushes or a neti pot.  - Humidifying the air.       Relevant Medications   azithromycin  (ZITHROMAX ) 250 MG tablet     Other   Insomnia   Chronic, ongoing.  Will reduce Trazodone  to 50 MG as needed at night, educated her on this medication use and side effects.  She tolerates well at baseline. ?Maintain a regular sleep schedule, particularly a regular wake-up time in the morning ?Try not to force sleep ?Avoid caffeinated beverages  after lunch ?Avoid alcohol near bedtime (eg, late afternoon and evening)  ?Avoid smoking or other nicotine intake, particularly during the evening ?Adjust the bedroom environment as needed to decrease stimuli (eg, reduce ambient light, turn off the television or radio) ?Avoid prolonged use of light-emitting screens (laptops, tablets, smartphones, ebooks) before bedtime  ?Resolve concerns or worries before bedtime ?Exercise regularly for at least 20 minutes, preferably more than four to five hours prior to bedtime  ?Avoid daytime naps, especially if they are longer than 20 to 30 minutes or occur late in the day         Follow up plan: Return for as scheduled in September.

## 2024-01-27 ENCOUNTER — Other Ambulatory Visit: Payer: Self-pay | Admitting: Nurse Practitioner

## 2024-01-27 NOTE — Telephone Encounter (Signed)
 Requested Prescriptions  Pending Prescriptions Disp Refills   omeprazole  (PRILOSEC) 20 MG capsule [Pharmacy Med Name: OMEPRAZOLE  DR 20 MG CAPSULE] 90 capsule 1    Sig: Take 1 capsule (20 mg total) by mouth daily.     Gastroenterology: Proton Pump Inhibitors Passed - 01/27/2024  5:08 PM      Passed - Valid encounter within last 12 months    Recent Outpatient Visits           2 months ago Acute non-recurrent frontal sinusitis   [place] [facility] [place], [place] T, NP   4 months ago Anxiety and depression   [place] [facility] [place], [place] T, NP   6 months ago Stage 3a chronic kidney disease [facility])    [facility] [place] Melanie DASEN, NP

## 2024-02-07 NOTE — Patient Instructions (Signed)
 Be Involved in Caring For Your Health:  Taking Medications When medications are taken as directed, they can greatly improve your health. But if they are not taken as prescribed, they may not work. In some cases, not taking them correctly can be harmful. To help ensure your treatment remains effective and safe, understand your medications and how to take them. Bring your medications to each visit for review by your provider.  Your lab results, notes, and after visit summary will be available on My Chart. We strongly encourage you to use this feature. If lab results are abnormal the clinic will contact you with the appropriate steps. If the clinic does not contact you assume the results are satisfactory. You can always view your results on My Chart. If you have questions regarding your health or results, please contact the clinic during office hours. You can also ask questions on My Chart.  We at [facility] are grateful that you chose [place] to provide your care. We strive to provide evidence-based and compassionate care and are always looking for feedback. If you get a survey from the clinic please complete this so we can hear your opinions.  Chronic Kidney Disease: Eating Plan Chronic kidney disease (CKD) is when your kidneys aren't working well. They can't remove waste, fluids, and other substances from your blood. When these substances build up, they can worsen kidney damage and affect your health. Eating certain foods can lead to a buildup of these substances. Changing your diet can help prevent more kidney damage. Diet changes may also delay dialysis or even keep you from needing it. What nutrients should I limit? Work with your health care team and an expert in healthy eating (dietitian) to make a meal plan that's right for you. Foods you can eat and foods you should limit or avoid will depend on: The stage of your kidney disease. Any other conditions you have. The items listed below  are not a complete list. Talk with a dietitian to learn what's best for you. Potassium Potassium affects how well your heart beats. Too much potassium in your blood can cause an irregular heartbeat or even a heart attack. You may need to limit foods that are high in potassium, such as: Liquid milk and soy milk. Salt substitutes that contain potassium. Fruits like: Bananas. Apricots. Melon. Prunes and raisins. Kiwi. Nectarines and oranges. Vegetables, such as: Potatoes, sweet potatoes, and yams. Tomatoes. Leafy greens. Beets. Avocado. Pumpkin and winter squash. Beans, like lima beans. Nuts. Phosphorus Phosphorus is a mineral found in your bones. You need a balance between calcium and phosphorus to build and maintain healthy bones.  Too much added phosphorus from the foods you eat can pull calcium from your bones. Losing calcium can make your bones weak and more likely to break. Too much phosphorus can also make your skin itch. You may need to limit foods that are high in phosphorus or that have added phosphorus, such as: Liquid milk and dairy products. Dark-colored sodas or soft drinks. Bran cereals and oatmeal. Protein  Protein helps your body make and keep muscle. Protein also helps to repair your body's cells and tissues.  One of the natural breakdown products of protein is a waste product called urea. When your kidneys aren't working well, they can't remove waste like urea. Reducing protein in your diet can help keep urea from building up in your blood. Depending on your stage of kidney disease, you may need to eat smaller portions of foods that  are high in protein. Sources of animal protein include: Meat (all types). Fish and seafood. Poultry. Eggs. Dairy. Other protein foods include: Beans and legumes. Nuts and nut butter. Soy, like tofu.  Sodium Salt (sodium) helps to keep a healthy balance of fluids in your body. Too much salt can increase your blood pressure,  which can harm your heart and lungs. Extra salt can also cause your body to keep too much fluid, making your kidneys work harder. You may need to limit or avoid foods that are high in salt, such as: Salt seasonings. Soy and teriyaki sauce. Meats that are: Packaged. Precooked. Cured. Processed. Salted crackers and snack foods. Fast food. Canned soups and foods. Pickled foods. Boxed mixes or ready-to-eat boxed meals and side dishes. Bottled dressings, sauces, and marinades. Talk with your dietitian about how much potassium, phosphorus, protein, and salt you may have each day. What are tips for following this plan? Reading food labels  Check the amount of salt in foods. Limit foods that have salt listed among the first five ingredients. Try to eat low-salt foods. Check the ingredient list for added phosphorus or potassium. "Phos" in an ingredient is a sign that phosphorus has been added. Do not buy foods that are calcium-enriched or that have calcium added to them (fortified). Buy canned vegetables and beans that say "no salt added." Rinse them before eating. Lifestyle Limit the amount of protein you eat from animal sources each day. Focus on protein from plant sources, like tofu and dried beans, peas, and lentils. Do not add salt to food when cooking or before eating. Do not eat star fruit. It can be toxic for people with kidney problems. Talk with your health care provider before taking any vitamin or mineral supplements. If told by your provider: Track how much liquid you have so you can avoid drinking too much. Try to eat foods that are made mostly from water, like gelatin, ice cream, soups, and juicy fruits and vegetables. If you have diabetes and chronic kidney disease: If you have diabetes and CKD, you need to keep your blood sugar (glucose) in the target range recommended by your provider. Follow your diabetes management plan. This may include: Checking your blood glucose  regularly. Taking medicines by mouth, or taking insulin, or both. Exercising for at least 30 minutes on 5 or more days each week, or as told by your provider. Tracking how many servings of carbohydrates you eat at each meal. Not using orange juice to treat low blood sugars. Instead, use apple juice, cranberry juice, or clear soda. You may be given guidelines on what foods and nutrients you may eat, and how much you can have each day. This depends on your stage of kidney disease and whether you have high blood pressure. Follow the meal plan your dietitian gives you. Where to find more information [employer] of Diabetes and Digestive and Kidney Diseases: [employer]Sync.si National Kidney Foundation: kidney.org This information is not intended to replace advice given to you by your health care provider. Make sure you discuss any questions you have with your health care provider. Document Revised: 12/24/2022 Document Reviewed: 12/24/2022 Elsevier Patient Education  2024 [employer].

## 2024-02-10 ENCOUNTER — Encounter: Payer: Self-pay | Admitting: Nurse Practitioner

## 2024-02-10 ENCOUNTER — Ambulatory Visit (INDEPENDENT_AMBULATORY_CARE_PROVIDER_SITE_OTHER): Admitting: Nurse Practitioner

## 2024-02-10 ENCOUNTER — Other Ambulatory Visit: Payer: Self-pay | Admitting: Nurse Practitioner

## 2024-02-10 VITALS — BP 120/79 | HR 65 | Temp 97.9°F | Resp 15 | Ht 64.02 in | Wt 156.0 lb

## 2024-02-10 DIAGNOSIS — N1831 Chronic kidney disease, stage 3a: Secondary | ICD-10-CM | POA: Diagnosis not present

## 2024-02-10 DIAGNOSIS — E063 Autoimmune thyroiditis: Secondary | ICD-10-CM | POA: Diagnosis not present

## 2024-02-10 DIAGNOSIS — F419 Anxiety disorder, unspecified: Secondary | ICD-10-CM | POA: Diagnosis not present

## 2024-02-10 DIAGNOSIS — E7800 Pure hypercholesterolemia, unspecified: Secondary | ICD-10-CM

## 2024-02-10 DIAGNOSIS — F5101 Primary insomnia: Secondary | ICD-10-CM | POA: Diagnosis not present

## 2024-02-10 DIAGNOSIS — F32A Depression, unspecified: Secondary | ICD-10-CM | POA: Diagnosis not present

## 2024-02-10 DIAGNOSIS — Z1231 Encounter for screening mammogram for malignant neoplasm of breast: Secondary | ICD-10-CM

## 2024-02-10 MED ORDER — LEVOTHYROXINE SODIUM 112 MCG PO TABS: 112.0000 ug | ORAL_TABLET | Freq: Every day | ORAL | 4 refills | Status: AC

## 2024-02-10 MED ORDER — PROAIR DIGIHALER 108 (90 BASE) MCG/ACT IN AEPB: 1.0000 | INHALATION_SPRAY | Freq: Four times a day (QID) | RESPIRATORY_TRACT | 4 refills | Status: AC | PRN

## 2024-02-10 NOTE — Assessment & Plan Note (Signed)
 Chronic, ongoing.  Will continue Trazodone  50 MG as needed at night, educated her on this medication use and side effects.  She tolerates well at baseline. ?Maintain a regular sleep schedule, particularly a regular wake-up time in the morning ?Try not to force sleep ?Avoid caffeinated beverages after lunch ?Avoid alcohol near bedtime (eg, late afternoon and evening)  ?Avoid smoking or other nicotine intake, particularly during the evening ?Adjust the bedroom environment as needed to decrease stimuli (eg, reduce ambient light, turn off the television or radio) ?Avoid prolonged use of light-emitting screens (laptops, tablets, smartphones, ebooks) before bedtime  ?Resolve concerns or worries before bedtime ?Exercise regularly for at least 20 minutes, preferably more than four to five hours prior to bedtime  ?Avoid daytime naps, especially if they are longer than 20 to 30 minutes or occur late in the day

## 2024-02-10 NOTE — Assessment & Plan Note (Signed)
 Ongoing on labs.  Will  continue focus on diet and exercise at this time.  Labs today. The 10-year ASCVD risk score (Arnett DK, et al., 2019) is: 6.7%   Values used to calculate the score:     Age: 63 years     Clincally relevant sex: Female     Is Non-Hispanic African American: No     Diabetic: No     Tobacco smoker: No     Systolic Blood Pressure: 120 mmHg     Is BP treated: No     HDL Cholesterol: 42 mg/dL     Total Cholesterol: 291 mg/dL

## 2024-02-10 NOTE — Assessment & Plan Note (Addendum)
 Chronic, stable.  Denies SI/HI.  Taking Trazodone  only as needed for sleep and mood.  Discussed with her and educated.  Avoid Buspar , did not tolerate.  Took Prozac  in past with benefit.

## 2024-02-10 NOTE — Assessment & Plan Note (Signed)
 Noted on labs since 2018.  Has cut back on soda and OTC NSAIDs.  Consider nephrology referral if any worsening.  Check CMP, CBC today.  Took Olmesartan  in past, but self stopped this as prefers less medications.  Could also consider Farxiga  in future, which now has CKD coverage.

## 2024-02-10 NOTE — Progress Notes (Signed)
 BP 120/79 (BP Location: Left Arm, Patient Position: Sitting, Cuff Size: Normal)   Pulse 65   Temp 97.9 F (36.6 C) (Oral)   Resp 15   Ht 5' 4.02 (1.626 m)   Wt 156 lb (70.8 kg)   LMP  (LMP Unknown)   BMI 26.76 kg/m    Subjective:    Patient ID: Laura Soto, female    DOB: Sep 29, 1960, 63 y.o.   MRN: [id number]  HPI: Laura Soto is a 63 y.o. female  Chief Complaint  Patient presents with   Thyroid  Problem    No concerns.    Mood    Going good except for death in family.    Medication Problem    Would like another inhaler brand. The ones she had last tends to malfunction per pharmacy.    Needs different inhaler, as current one is malfunctioning.  HYPOTHYROIDISM Continues Levothyroxine  112 MCG daily.  Thyroid  control status: stable Satisfied with current treatment? yes Medication side effects: no Medication compliance: good compliance Etiology of hypothyroidism: Hashimoto's Recent dose adjustment:no Fatigue: yes Cold intolerance: no Heat intolerance: no Weight gain: no Weight loss: no Constipation: no Diarrhea/loose stools: no Palpitations: no Lower extremity edema: no Anxiety/depressed mood: no The 10-year ASCVD risk score (Arnett DK, et al., 2019) is: 6.7%   Values used to calculate the score:     Age: 44 years     Clincally relevant sex: Female     Is Non-Hispanic African American: No     Diabetic: No     Tobacco smoker: No     Systolic Blood Pressure: 120 mmHg     Is BP treated: No     HDL Cholesterol: 42 mg/dL     Total Cholesterol: 291 mg/dL  CHRONIC KIDNEY DISEASE (3a) No medications at this time, prefers not to take anything unless needed. CKD status: stable Medications renally dose: yes Previous renal evaluation: no Pneumovax:  Not up to Date - refuses Influenza Vaccine:  Not up to Date -- refuses  DEPRESSION Taking  Trazodone .  Recent death in family. Mood status: exacerbated Satisfied with current treatment?: yes Symptom severity:  moderate  Duration of current treatment : chronic Side effects: no Medication compliance: good compliance Psychotherapy/counseling: none Depressed mood: no Anxious mood: no Anhedonia: no Significant weight loss or gain: no Insomnia: if takes Trazodone  sleeps well, does get up to urinate in middle of night Fatigue: no Feelings of worthlessness or guilt: no Impaired concentration/indecisiveness: no Suicidal ideations: no Hopelessness: no Crying spells: yes    02/10/2024   10:28 AM 09/16/2023    9:37 AM 07/21/2023    1:57 PM 11/11/2022   11:24 AM 10/11/2022   10:35 AM  Depression screen PHQ 2/9  Decreased Interest 1 1 0 0 0  Down, Depressed, Hopeless 0 0 1 0 0  PHQ - 2 Score 1 1 1  0 0  Altered sleeping 1 1 0 0 1  Tired, decreased energy 0 1 1 0 1  Change in appetite 1 0 0 0 1  Feeling bad or failure about yourself  0 0 0 0 0  Trouble concentrating 0 0 0 0 1  Moving slowly or fidgety/restless 0 0 0 0 0  Suicidal thoughts 0 0 0 0 0  PHQ-9 Score 3 3 2  0 4  Difficult doing work/chores Not difficult at all Somewhat difficult Somewhat difficult Not difficult at all Somewhat difficult       02/10/2024   10:28 AM 09/16/2023  9:39 AM 07/21/2023    1:58 PM 11/11/2022   11:25 AM  GAD 7 : Generalized Anxiety Score  Nervous, Anxious, on Edge 1 0 2 0  Control/stop worrying 1 1 1  0  Worry too much - different things 1 1 1  0  Trouble relaxing 0 1 0 0  Restless 1 1 0 0  Easily annoyed or irritable 1 1 2  0  Afraid - awful might happen 0 0 0 0  Total GAD 7 Score 5 5 6  0  Anxiety Difficulty Not difficult at all Somewhat difficult Somewhat difficult Not difficult at all   Relevant past medical, surgical, family and social history reviewed and updated as indicated. Interim medical history since our last visit reviewed. Allergies and medications reviewed and updated.  Review of Systems  Constitutional:  Negative for activity change, appetite change, diaphoresis, fatigue and fever.   Respiratory:  Negative for cough, chest tightness and shortness of breath.   Cardiovascular:  Negative for chest pain, palpitations and leg swelling.  Gastrointestinal: Negative.   Neurological: Negative.   Psychiatric/Behavioral:  Negative for decreased concentration, self-injury, sleep disturbance and suicidal ideas. The patient is not nervous/anxious.     Per HPI unless specifically indicated above     Objective:    BP 120/79 (BP Location: Left Arm, Patient Position: Sitting, Cuff Size: Normal)   Pulse 65   Temp 97.9 F (36.6 C) (Oral)   Resp 15   Ht 5' 4.02 (1.626 m)   Wt 156 lb (70.8 kg)   LMP  (LMP Unknown)   BMI 26.76 kg/m   Wt Readings from Last 3 Encounters:  02/10/24 156 lb (70.8 kg)  11/19/23 158 lb 9.6 oz (71.9 kg)  07/21/23 148 lb (67.1 kg)    Physical Exam Vitals and nursing note reviewed.  Constitutional:      General: She is awake. She is not in acute distress.    Appearance: She is well-developed and well-groomed. She is not ill-appearing or toxic-appearing.  HENT:     Head: Normocephalic.     Right Ear: Hearing and external ear normal.     Left Ear: Hearing and external ear normal.  Eyes:     General: Lids are normal.        Right eye: No discharge.        Left eye: No discharge.     Conjunctiva/sclera: Conjunctivae normal.     Pupils: Pupils are equal, round, and reactive to light.  Neck:     Thyroid : No thyromegaly.     Vascular: No carotid bruit.  Cardiovascular:     Rate and Rhythm: Normal rate and regular rhythm.     Heart sounds: Normal heart sounds. No murmur heard.    No gallop.  Pulmonary:     Effort: Pulmonary effort is normal. No accessory muscle usage or respiratory distress.     Breath sounds: Normal breath sounds.  Abdominal:     General: Bowel sounds are normal. There is no distension.     Palpations: Abdomen is soft.     Tenderness: There is no abdominal tenderness.  Musculoskeletal:     Cervical back: Normal range of  motion and neck supple.     Right lower leg: No edema.     Left lower leg: No edema.  Lymphadenopathy:     Cervical: No cervical adenopathy.  Skin:    General: Skin is warm and dry.  Neurological:     Mental Status: She is alert and oriented to  person, place, and time.     Deep Tendon Reflexes: Reflexes are normal and symmetric.     Reflex Scores:      Brachioradialis reflexes are 2+ on the right side and 2+ on the left side.      Patellar reflexes are 2+ on the right side and 2+ on the left side. Psychiatric:        Attention and Perception: Attention normal.        Mood and Affect: Mood normal.        Speech: Speech normal.        Behavior: Behavior normal. Behavior is cooperative.        Thought Content: Thought content normal.    Results for orders placed or performed in visit on 07/21/23  Comprehensive metabolic panel   Collection Time: 07/21/23  2:11 PM  Result Value Ref Range   Glucose 78 70 - 99 mg/dL   BUN 20 8 - 27 mg/dL   Creatinine, Ser 8.57 (H) 0.57 - 1.00 mg/dL   eGFR 41 (L) >40 fO/fpw/8.26   BUN/Creatinine Ratio 14 12 - 28   Sodium 141 134 - 144 mmol/L   Potassium 4.4 3.5 - 5.2 mmol/L   Chloride 105 96 - 106 mmol/L   CO2 19 (L) 20 - 29 mmol/L   Calcium 9.4 8.7 - 10.3 mg/dL   Total Protein 6.8 6.0 - 8.5 g/dL   Albumin 4.2 3.9 - 4.9 g/dL   Globulin, Total 2.6 1.5 - 4.5 g/dL   Bilirubin Total <9.7 0.0 - 1.2 mg/dL   Alkaline Phosphatase 116 44 - 121 IU/L   AST 17 0 - 40 IU/L   ALT 16 0 - 32 IU/L  CBC with Differential/Platelet   Collection Time: 07/21/23  2:11 PM  Result Value Ref Range   WBC 9.9 3.4 - 10.8 x10E3/uL   RBC 4.28 3.77 - 5.28 x10E6/uL   Hemoglobin 12.8 11.1 - 15.9 g/dL   Hematocrit 61.8 65.9 - 46.6 %   MCV 89 79 - 97 fL   MCH 29.9 26.6 - 33.0 pg   MCHC 33.6 31.5 - 35.7 g/dL   RDW 86.7 88.2 - 84.5 %   Platelets 236 150 - 450 x10E3/uL   Neutrophils 50 Not Estab. %   Lymphs 41 Not Estab. %   Monocytes 6 Not Estab. %   Eos 2 Not Estab. %    Basos 1 Not Estab. %   Neutrophils Absolute 5.0 1.4 - 7.0 x10E3/uL   Lymphocytes Absolute 4.1 (H) 0.7 - 3.1 x10E3/uL   Monocytes Absolute 0.6 0.1 - 0.9 x10E3/uL   EOS (ABSOLUTE) 0.2 0.0 - 0.4 x10E3/uL   Basophils Absolute 0.1 0.0 - 0.2 x10E3/uL   Immature Granulocytes 0 Not Estab. %   Immature Grans (Abs) 0.0 0.0 - 0.1 x10E3/uL  Lipid Panel w/o Chol/HDL Ratio   Collection Time: 07/21/23  2:11 PM  Result Value Ref Range   Cholesterol, Total 291 (H) 100 - 199 mg/dL   Triglycerides 619 (H) 0 - 149 mg/dL   HDL 42 >60 mg/dL   VLDL Cholesterol Cal 74 (H) 5 - 40 mg/dL   LDL Chol Calc (NIH) 824 (H) 0 - 99 mg/dL   LDL CALC COMMENT: Comment   TSH   Collection Time: 07/21/23  2:11 PM  Result Value Ref Range   TSH 2.390 0.450 - 4.500 uIU/mL  Magnesium   Collection Time: 07/21/23  2:11 PM  Result Value Ref Range   Magnesium 1.8 1.6 - 2.3  mg/dL  Microalbumin, Urine Waived   Collection Time: 07/21/23  2:11 PM  Result Value Ref Range   Microalb, Ur Waived 150 (H) 0 - 19 mg/L   Creatinine, Urine Waived 300 10 - 300 mg/dL   Microalb/Creat Ratio 30-300 (H) <30 mg/g  T4, free   Collection Time: 07/21/23  2:11 PM  Result Value Ref Range   Free T4 1.71 0.82 - 1.77 ng/dL      Assessment & Plan:   Problem List Items Addressed This Visit       Endocrine   Hashimoto's thyroiditis   Chronic, ongoing.  Continue current Levothyroxine  dose and adjust as needed.  Labs up to date.        Genitourinary   CKD (chronic kidney disease) stage 3, GFR 30-59 ml/min (HCC) - Primary   Noted on labs since 2018.  Has cut back on soda and OTC NSAIDs.  Consider nephrology referral if any worsening.  Check CMP, CBC today.  Took Olmesartan  in past, but self stopped this as prefers less medications.  Could also consider Farxiga  in future, which now has CKD coverage.        Relevant Orders   Comprehensive metabolic panel with GFR   CBC with Differential/Platelet     Other   Insomnia   Chronic, ongoing.   Will continue Trazodone  50 MG as needed at night, educated her on this medication use and side effects.  She tolerates well at baseline. ?Maintain a regular sleep schedule, particularly a regular wake-up time in the morning ?Try not to force sleep ?Avoid caffeinated beverages after lunch ?Avoid alcohol near bedtime (eg, late afternoon and evening)  ?Avoid smoking or other nicotine intake, particularly during the evening ?Adjust the bedroom environment as needed to decrease stimuli (eg, reduce ambient light, turn off the television or radio) ?Avoid prolonged use of light-emitting screens (laptops, tablets, smartphones, ebooks) before bedtime  ?Resolve concerns or worries before bedtime ?Exercise regularly for at least 20 minutes, preferably more than four to five hours prior to bedtime  ?Avoid daytime naps, especially if they are longer than 20 to 30 minutes or occur late in the day       Elevated low density lipoprotein (LDL) cholesterol level   Ongoing on labs.  Will  continue focus on diet and exercise at this time.  Labs today. The 10-year ASCVD risk score (Arnett DK, et al., 2019) is: 6.7%   Values used to calculate the score:     Age: 74 years     Clincally relevant sex: Female     Is Non-Hispanic African American: No     Diabetic: No     Tobacco smoker: No     Systolic Blood Pressure: 120 mmHg     Is BP treated: No     HDL Cholesterol: 42 mg/dL     Total Cholesterol: 291 mg/dL       Relevant Orders   Comprehensive metabolic panel with GFR   Lipid Panel w/o Chol/HDL Ratio   Anxiety and depression   Chronic, stable.  Denies SI/HI.  Taking Trazodone  only as needed for sleep and mood.  Discussed with her and educated.  Avoid Buspar , did not tolerate.  Took Prozac  in past with benefit.      Other Visit Diagnoses       Encounter for screening mammogram for malignant neoplasm of breast       Mammogram ordered and instructed how to schedule.   Relevant Orders   MM 3D  SCREENING MAMMOGRAM BILATERAL BREAST        Follow up plan: Return in about 6 months (around 08/09/2024) for NEED WELCOME TO MEDICARE BEFORE 09/01/24.

## 2024-02-10 NOTE — Assessment & Plan Note (Signed)
Chronic, ongoing.  Continue current Levothyroxine dose and adjust as needed.  Labs up to date.

## 2024-02-11 ENCOUNTER — Ambulatory Visit: Payer: Self-pay | Admitting: Nurse Practitioner

## 2024-02-11 DIAGNOSIS — E7800 Pure hypercholesterolemia, unspecified: Secondary | ICD-10-CM

## 2024-02-11 LAB — COMPREHENSIVE METABOLIC PANEL WITH GFR
ALT: 18 IU/L (ref 0–32)
AST: 19 IU/L (ref 0–40)
Albumin: 4.2 g/dL (ref 3.9–4.9)
Alkaline Phosphatase: 105 IU/L (ref 49–135)
BUN/Creatinine Ratio: 13 (ref 12–28)
BUN: 21 mg/dL (ref 8–27)
Bilirubin Total: 0.4 mg/dL (ref 0.0–1.2)
CO2: 20 mmol/L (ref 20–29)
Calcium: 9.2 mg/dL (ref 8.7–10.3)
Chloride: 104 mmol/L (ref 96–106)
Creatinine, Ser: 1.62 mg/dL — ABNORMAL HIGH (ref 0.57–1.00)
Globulin, Total: 2.9 g/dL (ref 1.5–4.5)
Glucose: 82 mg/dL (ref 70–99)
Potassium: 4.5 mmol/L (ref 3.5–5.2)
Sodium: 139 mmol/L (ref 134–144)
Total Protein: 7.1 g/dL (ref 6.0–8.5)
eGFR: 35 mL/min/1.73 — ABNORMAL LOW (ref >59)

## 2024-02-11 LAB — CBC WITH DIFFERENTIAL/PLATELET
Basophils Absolute: 0 x10E3/uL (ref 0.0–0.2)
Basos: 1 %
EOS (ABSOLUTE): 0.2 x10E3/uL (ref 0.0–0.4)
Eos: 3 %
Hematocrit: 42.2 % (ref 34.0–46.6)
Hemoglobin: 13.9 g/dL (ref 11.1–15.9)
Immature Grans (Abs): 0 x10E3/uL (ref 0.0–0.1)
Immature Granulocytes: 0 %
Lymphocytes Absolute: 3.4 x10E3/uL — ABNORMAL HIGH (ref 0.7–3.1)
Lymphs: 41 %
MCH: 29 pg (ref 26.6–33.0)
MCHC: 32.9 g/dL (ref 31.5–35.7)
MCV: 88 fL (ref 79–97)
Monocytes Absolute: 0.6 x10E3/uL (ref 0.1–0.9)
Monocytes: 7 %
Neutrophils Absolute: 3.9 x10E3/uL (ref 1.4–7.0)
Neutrophils: 48 %
Platelets: 225 x10E3/uL (ref 150–450)
RBC: 4.79 x10E6/uL (ref 3.77–5.28)
RDW: 13.8 % (ref 11.7–15.4)
WBC: 8.2 x10E3/uL (ref 3.4–10.8)

## 2024-02-11 LAB — LIPID PANEL W/O CHOL/HDL RATIO
Cholesterol, Total: 310 mg/dL — ABNORMAL HIGH (ref 100–199)
HDL: 48 mg/dL (ref >39)
LDL Chol Calc (NIH): 218 mg/dL — ABNORMAL HIGH (ref 0–99)
Triglycerides: 221 mg/dL — ABNORMAL HIGH (ref 0–149)
VLDL Cholesterol Cal: 44 mg/dL — ABNORMAL HIGH (ref 5–40)

## 2024-02-11 MED ORDER — DAPAGLIFLOZIN PROPANEDIOL 10 MG PO TABS
10.0000 mg | ORAL_TABLET | Freq: Every day | ORAL | 3 refills | Status: DC
Start: 2024-02-11 — End: 2024-04-14

## 2024-02-11 NOTE — Telephone Encounter (Signed)
 Rx 02/13/24 1each, 4RF Requested Prescriptions  Pending Prescriptions Disp Refills   albuterol  (VENTOLIN  HFA) 108 (90 Base) MCG/ACT inhaler [Pharmacy Med Name: ALBUTEROL  HFA 90 MCG INHALER] 8.5 g 0    Sig: Inhale 2 puffs into the lungs every 6 (six) hours as needed for wheezing or shortness of breath.     Pulmonology:  Beta Agonists 2 Passed - 02/11/2024  2:16 PM      Passed - Last BP in normal range    BP Readings from Last 1 Encounters:  02/10/24 120/79         Passed - Last Heart Rate in normal range    Pulse Readings from Last 1 Encounters:  02/10/24 65         Passed - Valid encounter within last 12 months    Recent Outpatient Visits           Yesterday Stage 3a chronic kidney disease (HCC)   [facility] Reader, [place] T, NP   2 months ago Acute non-recurrent frontal sinusitis   [facility] [place], [place] T, NP   4 months ago Anxiety and depression   [facility] [place], [place] T, NP   6 months ago Stage 3a chronic kidney disease [facility])   [place] [facility] Valerio Melanie DASEN, NP

## 2024-02-11 NOTE — Progress Notes (Signed)
 Contacted via MyChart = needs lab only visit in 2 weeks fasting to recheck lipid panel  Good afternoon Nature, your labs have returned: - Kidney function does continue to show chronic kidney disease stage 3a to 3b.  I would recommend starting a medication called Farxiga .  This is a medication initially used for diabetes, still is used for this, but they also found it offers a lot of benefit to kidney and heart health.  We now use it in patients who have kidney disease and no diabetes.  I am going to send this in.  It may make you urinate a little more, so take in morning. If any side effects present with this let me know.  Ensure to drink plenty of water daily while taking this, stay hydrated!! - Lipid panel is showing significant elevations, I suspect you were not fasting. I would like you to return in 2 weeks for fasting check.  We may need to start statin therapy to help lower levels if they remain this high.  Any questions? Keep being stellar!!  Thank you for allowing me to participate in your care.  I appreciate you. Kindest regards, Dortha Neighbors

## 2024-02-11 NOTE — Progress Notes (Signed)
 Scheduled

## 2024-02-18 ENCOUNTER — Ambulatory Visit: Admitting: Nurse Practitioner

## 2024-02-26 ENCOUNTER — Other Ambulatory Visit

## 2024-03-01 ENCOUNTER — Ambulatory Visit: Payer: Self-pay

## 2024-03-01 NOTE — Telephone Encounter (Signed)
 Unable to reach patient for triage x 3 attempts.

## 2024-03-01 NOTE — Telephone Encounter (Signed)
 Copied from CRM #[id number]. Topic: Clinical - Pink Word Triage >> Mar 01, 2024 10:06 AM Terri G wrote: Reason for Triage: Patient been taking FARXIGA  and she's been experiencing headaches and nauseous after eating. Callback number [phone] >> Mar 01, 2024 10:07 AM Terri G wrote: Patient been taking FARXIGA  and she's been experiencing headaches and nauseous after eating.

## 2024-03-01 NOTE — Telephone Encounter (Signed)
 Patient been taking FARXIGA  and she's been experiencing headaches and nauseous after eating.

## 2024-03-01 NOTE — Telephone Encounter (Signed)
 This RN attempted to contact pt x1, unable to leave vm message, vm not set up.

## 2024-03-02 NOTE — Telephone Encounter (Signed)
 w All Conversations on this Encounter   03/02/24 11:08 AM Nelwyn Laymon SAILOR, CMA routed this conversation to [facility] Pod B  (Selected Message)   03/02/24 10:50 AM Windell Albino HERO, RN routed this conversation to [facility]  Windell Albino HERO, RN    03/01/24 12:26 PM Note This RN attempted to contact pt x1, unable to leave vm message, vm not set up.    Windell Albino HERO, RN to Dann Anon F     03/01/24 12:26 PM vm not setup Windell Albino HERO, RN    03/01/24 12:24 PM Note Copied from CRM #[id number]. Topic: Clinical - Pink Word Triage >> Mar 01, 2024 10:06 AM Tysheama G wrote: Reason for Triage: Patient been taking FARXIGA  and she's been experiencing headaches and nauseous after eating. Callback number [phone] >> Mar 01, 2024 10:07 AM Terri G wrote: Patient been taking FARXIGA  and she's been experiencing headaches and nauseous after eating.       03/01/24 10:00 AM Parlow, Kahealani F contacted Veva Terri PENNER

## 2024-03-02 NOTE — Telephone Encounter (Signed)
 This encounter was created in error - please disregard.

## 2024-03-02 NOTE — Addendum Note (Signed)
 Addended by: Ashaz Robling M on: 03/02/2024 12:58 PM   Modules accepted: Orders, Level of Service

## 2024-03-03 NOTE — Telephone Encounter (Signed)
 Nausea, dizziness and headaches didn't resolve and restart with each dose but just continued. Says had been taking for about 2 weeks. Monday was the last time she had taken.   She is scheduled for labs only on 03/11/2024. Patient is aware this will be discussed with the provider and we will call her back based on provider information.

## 2024-03-05 NOTE — Telephone Encounter (Signed)
 Returned call patient. She is aware she does not need to resume and will be in office for lab only appointment next week.

## 2024-03-11 ENCOUNTER — Other Ambulatory Visit

## 2024-03-11 DIAGNOSIS — E7800 Pure hypercholesterolemia, unspecified: Secondary | ICD-10-CM | POA: Diagnosis not present

## 2024-03-12 ENCOUNTER — Ambulatory Visit: Payer: Self-pay | Admitting: Nurse Practitioner

## 2024-03-12 LAB — LIPID PANEL W/O CHOL/HDL RATIO
Cholesterol, Total: 263 mg/dL — ABNORMAL HIGH (ref 100–199)
HDL: 40 mg/dL (ref >39)
LDL Chol Calc (NIH): 171 mg/dL — ABNORMAL HIGH (ref 0–99)
Triglycerides: 275 mg/dL — ABNORMAL HIGH (ref 0–149)
VLDL Cholesterol Cal: 52 mg/dL — ABNORMAL HIGH (ref 5–40)

## 2024-03-12 NOTE — Progress Notes (Signed)
 Contacted via MyChart The 10-year ASCVD risk score (Arnett DK, et al., 2019) is: 6.5%   Values used to calculate the score:     Age: 63 years     Clincally relevant sex: Female     Is Non-Hispanic African American: No     Diabetic: No     Tobacco smoker: No     Systolic Blood Pressure: 120 mmHg     Is BP treated: No     HDL Cholesterol: 40 mg/dL     Total Cholesterol: 263 mg/dL  Good morning Laura Soto, your labs have returned. Lipid panel does continue to show elevations. If these continue next visit we will discuss starting medication to help prevent stroke or heart event. This would be a statin. Levels are lower than previous check, but still elevated placing you close to levels where we would recommend medication.  Any questions? Keep being stellar!!  Thank you for allowing me to participate in your care.  I appreciate you. Kindest regards, Jerrine Urschel

## 2024-04-11 NOTE — Patient Instructions (Signed)
 Managing Anxiety, Adult  After being diagnosed with anxiety, you may be relieved to know why you have felt or behaved a certain way. You may also feel overwhelmed about the treatment ahead and what it will mean for your life. With care and support, you can manage your anxiety.  How to manage lifestyle changes  Understanding the difference between stress and anxiety  Although stress can play a role in anxiety, it is not the same as anxiety. Stress is your body's reaction to life changes and events, both good and bad. Stress is often caused by something external, such as a deadline, test, or competition. It normally goes away after the event has ended and will last just a few hours. But, stress can be ongoing and can lead to more than just stress.  Anxiety is caused by something internal, such as imagining a terrible outcome or worrying that something will go wrong that will greatly upset you. Anxiety often does not go away even after the event is over, and it can become a long-term (chronic) worry.  Lowering stress and anxiety    Talk with your health care provider or a counselor to learn more about lowering anxiety and stress. They may suggest tension-reduction techniques, such as:  Music. Spend time creating or listening to music that you enjoy and that inspires you.  Mindfulness-based meditation. Practice being aware of your normal breaths while not trying to control your breathing. It can be done while sitting or walking.  Centering prayer. Focus on a word, phrase, or sacred image that means something to you and brings you peace.  Deep breathing. Expand your stomach and inhale slowly through your nose. Hold your breath for 3-5 seconds. Then breathe out slowly, letting your stomach muscles relax.  Self-talk. Learn to notice and spot thought patterns that lead to anxiety reactions. Change those patterns to thoughts that feel peaceful.  Muscle relaxation. Take time to tense muscles and then relax them.  Choose a  tension-reduction technique that fits your lifestyle and personality. These techniques take time and practice. Set aside 5-15 minutes a day to do them. Specialized therapists can offer counseling and training in these techniques. The training to help with anxiety may be covered by some insurance plans.  Other things you can do to manage stress and anxiety include:  Keeping a stress diary. This can help you learn what triggers your reaction and then learn ways to manage your response.  Thinking about how you react to certain situations. You may not be able to control everything, but you can control your response.  Making time for activities that help you relax and not feeling guilty about spending your time in this way.  Doing visual imagery. This involves imagining or creating mental pictures to help you relax.  Practicing yoga. Through yoga poses, you can lower tension and relax.     Medicines  Medicines for anxiety include:  Antidepressant medicines. These are usually prescribed for long-term daily control.  Anti-anxiety medicines. These may be added in severe cases, especially when panic attacks occur.  When used together, medicines, psychotherapy, and tension-reduction techniques may be the most effective treatment.  Relationships  Relationships can play a big part in helping you recover. Spend more time connecting with trusted friends and family members. Think about going to couples counseling if you have a partner, taking family education classes, or going to family therapy. Therapy can help you and others better understand your anxiety.  How to recognize changes in  your anxiety  Everyone responds differently to treatment for anxiety. Recovery from anxiety happens when symptoms lessen and stop interfering with your daily life at home or work. This may mean that you will start to:  Have better concentration and focus. Worry will interfere less in your daily thinking.  Sleep better.  Be less irritable.  Have  more energy.  Have improved memory.  Try to recognize when your condition is getting worse. Contact your provider if your symptoms interfere with home or work and you feel like your condition is not improving.  Follow these instructions at home:  Activity  Exercise. Adults should:  Exercise for at least 150 minutes each week. The exercise should increase your heart rate and make you sweat (moderate-intensity exercise).  Do strengthening exercises at least twice a week.  Get the right amount and quality of sleep. Most adults need 7-9 hours of sleep each night.  Lifestyle    Eat a healthy diet that includes plenty of vegetables, fruits, whole grains, low-fat dairy products, and lean protein.  Do not eat a lot of foods that are high in fats, added sugars, or salt (sodium).  Make choices that simplify your life.  Do not use any products that contain nicotine or tobacco. These products include cigarettes, chewing tobacco, and vaping devices, such as e-cigarettes. If you need help quitting, ask your provider.  Avoid caffeine, alcohol, and certain over-the-counter cold medicines. These may make you feel worse. Ask your pharmacist which medicines to avoid.  General instructions  Take over-the-counter and prescription medicines only as told by your provider.  Keep all follow-up visits. This is to make sure you are managing your anxiety well or if you need more support.  Where to find support  You can get help and support from:  Self-help groups.  Online and [employer].  A trusted spiritual leader.  Couples counseling.  Family education classes.  Family therapy.  Where to find more information  You may find that joining a support group helps you deal with your anxiety. The following sources can help you find counselors or support groups near you:  Mental Health America: mentalhealthamerica.net  Anxiety and Depression Association of [place] (ADAA): adaa.org  [employer] Mental Illness (NAMI):  nami.org  Contact a health care provider if:  You have a hard time staying focused or finishing tasks.  You spend many hours a day feeling worried about everyday life.  You are very tired because you cannot stop worrying.  You start to have headaches or often feel tense.  You have chronic nausea or diarrhea.  Get help right away if:  Your heart feels like it is racing.  You have shortness of breath.  You have thoughts of hurting yourself or others.  Get help right away if you feel like you may hurt yourself or others, or have thoughts about taking your own life. Go to your nearest emergency room or:  Call 911.  Call the National Suicide Prevention Lifeline at (562)309-0957 or 988. This is open 24 hours a day.  Text the Crisis Text Line at 786-142-3811.  This information is not intended to replace advice given to you by your health care provider. Make sure you discuss any questions you have with your health care provider.  Document Revised: 02/19/2022 Document Reviewed: 09/03/2020  Elsevier Patient Education  2024 [employer].

## 2024-04-14 ENCOUNTER — Ambulatory Visit (INDEPENDENT_AMBULATORY_CARE_PROVIDER_SITE_OTHER): Admitting: Nurse Practitioner

## 2024-04-14 ENCOUNTER — Encounter: Payer: Self-pay | Admitting: Nurse Practitioner

## 2024-04-14 VITALS — BP 117/84 | HR 76 | Temp 97.9°F | Resp 15 | Ht 64.02 in | Wt 151.0 lb

## 2024-04-14 DIAGNOSIS — N1831 Chronic kidney disease, stage 3a: Secondary | ICD-10-CM

## 2024-04-14 DIAGNOSIS — F419 Anxiety disorder, unspecified: Secondary | ICD-10-CM

## 2024-04-14 DIAGNOSIS — F32A Depression, unspecified: Secondary | ICD-10-CM | POA: Diagnosis not present

## 2024-04-14 NOTE — Assessment & Plan Note (Signed)
 Noted on labs since 2018.  Has cut back on soda and OTC NSAIDs.  No history of contrast imaging.  Consider nephrology referral if any worsening.  Took Olmesartan  in past, but self stopped this as prefers less medications. Did not tolerate Farxiga . Check labs next visit, she does not wish to check today. Discussed with her she may benefit Kidney Ultrasound and she plans on looking into cost for this. Next visit check CMP, PTH, and multiple myeloma labs.

## 2024-04-14 NOTE — Progress Notes (Signed)
 BP 117/84 (BP Location: Left Arm, Patient Position: Sitting, Cuff Size: Normal)   Pulse 76   Temp 97.9 F (36.6 C) (Oral)   Resp 15   Ht 5' 4.02 (1.626 m)   Wt 151 lb (68.5 kg)   LMP  (LMP Unknown)   SpO2 98%   BMI 25.91 kg/m    Subjective:    Patient ID: Laura Soto, female    DOB: 1960-08-12, 63 y.o.   MRN: [id number]  HPI: OBERA STAUCH is a 63 y.o. female  Chief Complaint  Patient presents with   Follow-up    Back on anti-depressant and feeling much better now.    CHRONIC KIDNEY DISEASE Tried Farxiga , but this caused nausea and headaches. CKD status: stable Medications renally dose: yes Previous renal evaluation: no Pneumovax:  refused Influenza Vaccine:  refused   ANXIETY/STRESS Follow-up today for restarting Prozac  20 MG daily. Overall feeling better since restarting. Duration:stable, improved Anxious mood: no  Excessive worrying: no Irritability: no  Sweating: no Nausea: no Palpitations:no Hyperventilation: no Panic attacks: no Agoraphobia: no  Obscessions/compulsions: no Depressed mood: no    02-25-2024   10:28 AM 09/16/2023    9:37 AM 07/21/2023    1:57 PM 11/11/2022   11:24 AM 10/11/2022   10:35 AM  Depression screen PHQ 2/9  Decreased Interest 1 1 0 0 0  Down, Depressed, Hopeless 0 0 1 0 0  PHQ - 2 Score 1 1 1  0 0  Altered sleeping 1 1 0 0 1  Tired, decreased energy 0 1 1 0 1  Change in appetite 1 0 0 0 1  Feeling bad or failure about yourself  0 0 0 0 0  Trouble concentrating 0 0 0 0 1  Moving slowly or fidgety/restless 0 0 0 0 0  Suicidal thoughts 0 0 0 0 0  PHQ-9 Score 3  3  2   0  4   Difficult doing work/chores Not difficult at all Somewhat difficult Somewhat difficult Not difficult at all Somewhat difficult     Data saved with a previous flowsheet row definition  Anhedonia: no Weight changes: no Insomnia: gets up at 6 am, but Trazodone  helps her sleep Hypersomnia: no Fatigue/loss of energy: no Feelings of worthlessness:  no Feelings of guilt: no Impaired concentration/indecisiveness: no Suicidal ideations: no  Crying spells: no Recent Stressors/Life Changes: no   Relationship problems: no   Family stress: no     Financial stress: no    Job stress: no    Recent death/loss: no     25-Feb-2024   10:28 AM 09/16/2023    9:39 AM 07/21/2023    1:58 PM 11/11/2022   11:25 AM  GAD 7 : Generalized Anxiety Score  Nervous, Anxious, on Edge 1 0 2 0  Control/stop worrying 1 1 1  0  Worry too much - different things 1 1 1  0  Trouble relaxing 0 1 0 0  Restless 1 1 0 0  Easily annoyed or irritable 1 1 2  0  Afraid - awful might happen 0 0 0 0  Total GAD 7 Score 5 5 6  0  Anxiety Difficulty Not difficult at all Somewhat difficult Somewhat difficult Not difficult at all      Relevant past medical, surgical, family and social history reviewed and updated as indicated. Interim medical history since our last visit reviewed. Allergies and medications reviewed and updated.  Review of Systems  Constitutional:  Negative for activity change, appetite change, diaphoresis, fatigue and  fever.  Respiratory:  Negative for cough, chest tightness and shortness of breath.   Cardiovascular:  Negative for chest pain, palpitations and leg swelling.  Gastrointestinal: Negative.   Neurological: Negative.   Psychiatric/Behavioral:  Negative for decreased concentration, self-injury, sleep disturbance and suicidal ideas. The patient is not nervous/anxious.     Per HPI unless specifically indicated above     Objective:    BP 117/84 (BP Location: Left Arm, Patient Position: Sitting, Cuff Size: Normal)   Pulse 76   Temp 97.9 F (36.6 C) (Oral)   Resp 15   Ht 5' 4.02 (1.626 m)   Wt 151 lb (68.5 kg)   LMP  (LMP Unknown)   SpO2 98%   BMI 25.91 kg/m   Wt Readings from Last 3 Encounters:  04/14/24 151 lb (68.5 kg)  02/10/24 156 lb (70.8 kg)  11/19/23 158 lb 9.6 oz (71.9 kg)    Physical Exam Vitals and nursing note reviewed.   Constitutional:      General: She is awake. She is not in acute distress.    Appearance: She is well-developed and well-groomed. She is not ill-appearing or toxic-appearing.  HENT:     Head: Normocephalic.     Right Ear: Hearing and external ear normal.     Left Ear: Hearing and external ear normal.  Eyes:     General: Lids are normal.        Right eye: No discharge.        Left eye: No discharge.     Conjunctiva/sclera: Conjunctivae normal.     Pupils: Pupils are equal, round, and reactive to light.  Neck:     Thyroid : No thyromegaly.     Vascular: No carotid bruit.  Cardiovascular:     Rate and Rhythm: Normal rate and regular rhythm.     Heart sounds: Normal heart sounds. No murmur heard.    No gallop.  Pulmonary:     Effort: Pulmonary effort is normal. No accessory muscle usage or respiratory distress.     Breath sounds: Normal breath sounds.  Abdominal:     General: Bowel sounds are normal. There is no distension.     Palpations: Abdomen is soft.     Tenderness: There is no abdominal tenderness.  Musculoskeletal:     Cervical back: Normal range of motion and neck supple.     Right lower leg: No edema.     Left lower leg: No edema.  Lymphadenopathy:     Cervical: No cervical adenopathy.  Skin:    General: Skin is warm and dry.  Neurological:     Mental Status: She is alert and oriented to person, place, and time.     Deep Tendon Reflexes: Reflexes are normal and symmetric.     Reflex Scores:      Brachioradialis reflexes are 2+ on the right side and 2+ on the left side.      Patellar reflexes are 2+ on the right side and 2+ on the left side. Psychiatric:        Attention and Perception: Attention normal.        Mood and Affect: Mood normal.        Speech: Speech normal.        Behavior: Behavior normal. Behavior is cooperative.        Thought Content: Thought content normal.    Results for orders placed or performed in visit on 03/11/24  Lipid Panel w/o Chol/HDL  Ratio   Collection Time: 03/11/24  9:44 AM  Result Value Ref Range   Cholesterol, Total 263 (H) 100 - 199 mg/dL   Triglycerides 724 (H) 0 - 149 mg/dL   HDL 40 >60 mg/dL   VLDL Cholesterol Cal 52 (H) 5 - 40 mg/dL   LDL Chol Calc (NIH) 828 (H) 0 - 99 mg/dL      Assessment & Plan:   Problem List Items Addressed This Visit       Genitourinary   CKD (chronic kidney disease) stage 3, GFR 30-59 ml/min (HCC)   Noted on labs since 2018.  Has cut back on soda and OTC NSAIDs.  No history of contrast imaging.  Consider nephrology referral if any worsening.  Took Olmesartan  in past, but self stopped this as prefers less medications. Did not tolerate Farxiga . Check labs next visit, she does not wish to check today. Discussed with her she may benefit Kidney Ultrasound and she plans on looking into cost for this. Next visit check CMP, PTH, and multiple myeloma labs.        Other   Anxiety and depression - Primary   Chronic, stable. Denies SI/HI. Restarted Prozac  and doing much better, continue this at 20 MG daily + Trazodone  for sleep. Refills as needed.      Relevant Medications   FLUoxetine  (PROZAC ) 20 MG capsule     Follow up plan: Return for as scheduled March 2026.

## 2024-04-14 NOTE — Assessment & Plan Note (Signed)
 Chronic, stable. Denies SI/HI. Restarted Prozac  and doing much better, continue this at 20 MG daily + Trazodone  for sleep. Refills as needed.

## 2024-04-29 ENCOUNTER — Telehealth: Payer: Self-pay

## 2024-04-29 NOTE — Telephone Encounter (Signed)
 Copied from CRM [id number]. Topic: General - Call Back - No Documentation >> Apr 29, 2024 10:41 AM Olam RAMAN wrote: Reason for CRM: pt asked for a cb for a nurse to see if pt can set up a mammogram soon Cb [phone]

## 2024-04-29 NOTE — Telephone Encounter (Signed)
 Mammogram order is in the patient's chart.   Tried calling patient to let her know that the order is in and that she can call [facility] to schedule at [phone].   OK for E2C2 to speak to patient and advise her of the above if she calls back.

## 2024-04-30 ENCOUNTER — Telehealth: Payer: Self-pay | Admitting: Nurse Practitioner

## 2024-04-30 NOTE — Telephone Encounter (Unsigned)
 Copied from CRM [id number]. Topic: Clinical - Medication Question >> Apr 30, 2024  1:55 PM Hadassah PARAS wrote: Reason for CRM: Pt has stage 3 kidney problems. Pt was originally put on dapagliflozin  propanediol (FARXIGA ) 10 MG TABS tablet but was making her feel unwell. Pt was feeling nauseous, loss of appetite, and headaches. Pt would like to be put back on medication, possibly a different one or a lower dosage. Family members are upset she was not originally put on something different after experiencing those symptoms. Please advise pt on #[phone]

## 2024-05-03 NOTE — Telephone Encounter (Signed)
 Forwarding for scheduling assistance. Thank you!

## 2024-05-03 NOTE — Telephone Encounter (Signed)
 Scheduled

## 2024-05-06 ENCOUNTER — Encounter: Payer: Self-pay | Admitting: Nurse Practitioner

## 2024-05-06 ENCOUNTER — Ambulatory Visit: Admitting: Nurse Practitioner

## 2024-05-06 DIAGNOSIS — N1832 Chronic kidney disease, stage 3b: Secondary | ICD-10-CM | POA: Diagnosis not present

## 2024-05-06 DIAGNOSIS — E7800 Pure hypercholesterolemia, unspecified: Secondary | ICD-10-CM

## 2024-05-06 LAB — MICROALBUMIN, URINE WAIVED
Creatinine, Urine Waived: 200 mg/dL (ref 10–300)
Microalb, Ur Waived: 80 mg/L — ABNORMAL HIGH (ref 0–19)

## 2024-05-06 MED ORDER — OLMESARTAN MEDOXOMIL 5 MG PO TABS: 5.0000 mg | ORAL_TABLET | Freq: Every day | ORAL | 3 refills | Status: AC

## 2024-05-06 NOTE — Patient Instructions (Signed)
 Chronic Kidney Disease: Eating Plan Chronic kidney disease (CKD) is when your kidneys aren't working well. They can't remove waste, fluids, and other substances from your blood. When these substances build up, they can worsen kidney damage and affect your health. Eating certain foods can lead to a buildup of these substances. Changing your diet can help prevent more kidney damage. Diet changes may also delay dialysis or even keep you from needing it. What nutrients should I limit? Work with your health care team and an expert in healthy eating (dietitian) to make a meal plan that's right for you. Foods you can eat and foods you should limit or avoid will depend on: The stage of your kidney disease. Any other conditions you have. The items listed below are not a complete list. Talk with a dietitian to learn what's best for you. Potassium Potassium affects how well your heart beats. Too much potassium in your blood can cause an irregular heartbeat or even a heart attack. You may need to limit foods that are high in potassium, such as: Liquid milk and soy milk. Salt substitutes that contain potassium. Fruits like: Bananas. Apricots. Melon. Prunes and raisins. Kiwi. Nectarines and oranges. Vegetables, such as: Potatoes, sweet potatoes, and yams. Tomatoes. Leafy greens. Beets. Avocado. Pumpkin and winter squash. Beans, like lima beans. Nuts. Phosphorus Phosphorus is a mineral found in your bones. You need a balance between calcium and phosphorus to build and maintain healthy bones.  Too much added phosphorus from the foods you eat can pull calcium from your bones. Losing calcium can make your bones weak and more likely to break. Too much phosphorus can also make your skin itch. You may need to limit foods that are high in phosphorus or that have added phosphorus, such as: Liquid milk and dairy products. Dark-colored sodas or soft drinks. Bran cereals and oatmeal. Protein  Protein  helps your body make and keep muscle. Protein also helps to repair your body's cells and tissues.  One of the natural breakdown products of protein is a waste product called urea. When your kidneys aren't working well, they can't remove waste like urea. Reducing protein in your diet can help keep urea from building up in your blood. Depending on your stage of kidney disease, you may need to eat smaller portions of foods that are high in protein. Sources of animal protein include: Meat (all types). Fish and seafood. Poultry. Eggs. Dairy. Other protein foods include: Beans and legumes. Nuts and nut butter. Soy, like tofu.  Sodium Salt (sodium) helps to keep a healthy balance of fluids in your body. Too much salt can increase your blood pressure, which can harm your heart and lungs. Extra salt can also cause your body to keep too much fluid, making your kidneys work harder. You may need to limit or avoid foods that are high in salt, such as: Salt seasonings. Soy and teriyaki sauce. Meats that are: Packaged. Precooked. Cured. Processed. Salted crackers and snack foods. Fast food. Canned soups and foods. Pickled foods. Boxed mixes or ready-to-eat boxed meals and side dishes. Bottled dressings, sauces, and marinades. Talk with your dietitian about how much potassium, phosphorus, protein, and salt you may have each day. What are tips for following this plan? Reading food labels  Check the amount of salt in foods. Limit foods that have salt listed among the first five ingredients. Try to eat low-salt foods. Check the ingredient list for added phosphorus or potassium. "Phos" in an ingredient is a sign that  phosphorus has been added. Do not buy foods that are calcium-enriched or that have calcium added to them (fortified). Buy canned vegetables and beans that say "no salt added." Rinse them before eating. Lifestyle Limit the amount of protein you eat from animal sources each day. Focus on  protein from plant sources, like tofu and dried beans, peas, and lentils. Do not add salt to food when cooking or before eating. Do not eat star fruit. It can be toxic for people with kidney problems. Talk with your health care provider before taking any vitamin or mineral supplements. If told by your provider: Track how much liquid you have so you can avoid drinking too much. Try to eat foods that are made mostly from water, like gelatin, ice cream, soups, and juicy fruits and vegetables. If you have diabetes and chronic kidney disease: If you have diabetes and CKD, you need to keep your blood sugar (glucose) in the target range recommended by your provider. Follow your diabetes management plan. This may include: Checking your blood glucose regularly. Taking medicines by mouth, or taking insulin, or both. Exercising for at least 30 minutes on 5 or more days each week, or as told by your provider. Tracking how many servings of carbohydrates you eat at each meal. Not using orange juice to treat low blood sugars. Instead, use apple juice, cranberry juice, or clear soda. You may be given guidelines on what foods and nutrients you may eat, and how much you can have each day. This depends on your stage of kidney disease and whether you have high blood pressure. Follow the meal plan your dietitian gives you. Where to find more information [employer] of Diabetes and Digestive and Kidney Diseases: StageSync.si National Kidney Foundation: kidney.org This information is not intended to replace advice given to you by your health care provider. Make sure you discuss any questions you have with your health care provider. Document Revised: 12/24/2022 Document Reviewed: 12/24/2022 Elsevier Patient Education  2024 [employer].

## 2024-05-06 NOTE — Progress Notes (Signed)
 BP 124/82   Pulse 72   Temp 98.3 F (36.8 C) (Oral)   Ht 5' 4 (1.626 m)   Wt 152 lb 9.6 oz (69.2 kg)   LMP  (LMP Unknown)   SpO2 96%   BMI 26.19 kg/m    Subjective:    Patient ID: Laura Soto, female    DOB: [birth year], 63 y.o.   MRN: [id number]  HPI: Laura Soto is a 63 y.o. female  Chief Complaint  Patient presents with   Chronic Kidney Disease   CHRONIC KIDNEY DISEASE (CKD 3b) Presents today for follow-up on kidney disease. This was initially noted on labs in March 2018. Recently sent in trial of Farxiga , but this was not tolerated. In past took Olmesartan  5 MG, but stopped this back in 2023 as she wanted less medications.   CKD status: stable Medications renally dose: no Previous renal evaluation: no Pneumovax:  Refuses Influenza Vaccine:  Refuses     Latest Ref Rng & Units 02/10/2024   10:20 AM 07/21/2023    2:11 PM 01/01/2022   12:11 PM  CMP  Glucose 70 - 99 mg/dL 82  78  82   BUN 8 - 27 mg/dL 21  20  25    Creatinine 0.57 - 1.00 mg/dL 8.37  8.57  8.62   Sodium 134 - 144 mmol/L 139  141  140   Potassium 3.5 - 5.2 mmol/L 4.5  4.4  4.8   Chloride 96 - 106 mmol/L 104  105  103   CO2 20 - 29 mmol/L 20  19  16    Calcium 8.7 - 10.3 mg/dL 9.2  9.4  89.8   Total Protein 6.0 - 8.5 g/dL 7.1  6.8  8.1   Total Bilirubin 0.0 - 1.2 mg/dL 0.4  <9.7  0.2   Alkaline Phos 49 - 135 IU/L 105  116  108   AST 0 - 40 IU/L 19  17  22    ALT 0 - 32 IU/L 18  16  17      The 10-year ASCVD risk score (Arnett DK, et al., 2019) is: 6.9%   Values used to calculate the score:     Age: 30 years     Clinically relevant sex: Female     Is Non-Hispanic African American: No     Diabetic: No     Tobacco smoker: No     Systolic Blood Pressure: 124 mmHg     Is BP treated: No     HDL Cholesterol: 40 mg/dL     Total Cholesterol: 263 mg/dL  Relevant past medical, surgical, family and social history reviewed and updated as indicated. Interim medical history since our last visit  reviewed. Allergies and medications reviewed and updated.  Review of Systems  Constitutional:  Negative for activity change, appetite change, diaphoresis, fatigue and fever.  Respiratory:  Negative for cough, chest tightness and shortness of breath.   Cardiovascular:  Negative for chest pain, palpitations and leg swelling.  Gastrointestinal: Negative.   Neurological: Negative.   Psychiatric/Behavioral:  Negative for decreased concentration, self-injury, sleep disturbance and suicidal ideas. The patient is not nervous/anxious.     Per HPI unless specifically indicated above     Objective:    BP 124/82   Pulse 72   Temp 98.3 F (36.8 C) (Oral)   Ht 5' 4 (1.626 m)   Wt 152 lb 9.6 oz (69.2 kg)   LMP  (LMP Unknown)   SpO2 96%   BMI 26.19  kg/m   Wt Readings from Last 3 Encounters:  05/06/24 152 lb 9.6 oz (69.2 kg)  04/14/24 151 lb (68.5 kg)  02/10/24 156 lb (70.8 kg)    Physical Exam Vitals and nursing note reviewed.  Constitutional:      General: She is awake. She is not in acute distress.    Appearance: She is well-developed and well-groomed. She is not ill-appearing or toxic-appearing.  HENT:     Head: Normocephalic.     Right Ear: Hearing and external ear normal.     Left Ear: Hearing and external ear normal.  Eyes:     General: Lids are normal.        Right eye: No discharge.        Left eye: No discharge.     Conjunctiva/sclera: Conjunctivae normal.     Pupils: Pupils are equal, round, and reactive to light.  Neck:     Thyroid : No thyromegaly.     Vascular: No carotid bruit.  Cardiovascular:     Rate and Rhythm: Normal rate and regular rhythm.     Heart sounds: Normal heart sounds. No murmur heard.    No gallop.  Pulmonary:     Effort: Pulmonary effort is normal. No accessory muscle usage or respiratory distress.     Breath sounds: Normal breath sounds.  Abdominal:     General: Bowel sounds are normal. There is no distension.     Palpations: Abdomen is  soft.     Tenderness: There is no abdominal tenderness.  Musculoskeletal:     Cervical back: Normal range of motion and neck supple.     Right lower leg: No edema.     Left lower leg: No edema.  Lymphadenopathy:     Cervical: No cervical adenopathy.  Skin:    General: Skin is warm and dry.  Neurological:     Mental Status: She is alert and oriented to person, place, and time.     Deep Tendon Reflexes: Reflexes are normal and symmetric.     Reflex Scores:      Brachioradialis reflexes are 2+ on the right side and 2+ on the left side.      Patellar reflexes are 2+ on the right side and 2+ on the left side. Psychiatric:        Attention and Perception: Attention normal.        Mood and Affect: Mood normal.        Speech: Speech normal.        Behavior: Behavior normal. Behavior is cooperative.        Thought Content: Thought content normal.     Results for orders placed or performed in visit on 05/06/24  Microalbumin, Urine Waived   Collection Time: 05/06/24 12:01 PM  Result Value Ref Range   Microalb, Ur Waived 80 (H) 0 - 19 mg/L   Creatinine, Urine Waived 200 10 - 300 mg/dL   Microalb/Creat Ratio 30-300 (H) <30 mg/g      Assessment & Plan:   Problem List Items Addressed This Visit       Genitourinary   CKD (chronic kidney disease) stage 3, GFR 30-59 ml/min (HCC) - Primary   Noted on labs since 2018.  Has cut back on soda and OTC NSAIDs.  No history of contrast imaging.  Consider nephrology referral if any worsening on labs. Will restart Olmesartan , as tolerated this in past and would offer benefit to CKD and proteinuria. Educated her on this. Did not tolerate  Farxiga . Labs today. Obtain kidney ultrasound. Could consider multiple myeloma labs in future to further assess. Urine ALB 80 December 2025.      Relevant Orders   Basic metabolic panel with GFR   Microalbumin, Urine Waived (Completed)   US  Renal     Other   Elevated low density lipoprotein (LDL) cholesterol  level   Ongoing on labs.  Will  continue focus on diet and exercise at this time.  Labs today. The 10-year ASCVD risk score (Arnett DK, et al., 2019) is: 6.9%   Values used to calculate the score:     Age: 73 years     Clinically relevant sex: Female     Is Non-Hispanic African American: No     Diabetic: No     Tobacco smoker: No     Systolic Blood Pressure: 124 mmHg     Is BP treated: No     HDL Cholesterol: 40 mg/dL     Total Cholesterol: 263 mg/dL       Relevant Orders   Lipid Panel w/o Chol/HDL Ratio     Follow up plan: Return in about 4 weeks (around 06/03/2024) for CKD -- restart Olmesartan .

## 2024-05-06 NOTE — Assessment & Plan Note (Addendum)
 Noted on labs since 2018.  Has cut back on soda and OTC NSAIDs.  No history of contrast imaging.  Consider nephrology referral if any worsening on labs. Will restart Olmesartan , as tolerated this in past and would offer benefit to CKD and proteinuria. Educated her on this. Did not tolerate Farxiga . Labs today. Obtain kidney ultrasound. Could consider multiple myeloma labs in future to further assess. Urine ALB 80 December 2025.

## 2024-05-06 NOTE — Assessment & Plan Note (Signed)
 Ongoing on labs.  Will  continue focus on diet and exercise at this time.  Labs today. The 10-year ASCVD risk score (Arnett DK, et al., 2019) is: 6.9%   Values used to calculate the score:     Age: 63 years     Clinically relevant sex: Female     Is Non-Hispanic African American: No     Diabetic: No     Tobacco smoker: No     Systolic Blood Pressure: 124 mmHg     Is BP treated: No     HDL Cholesterol: 40 mg/dL     Total Cholesterol: 263 mg/dL

## 2024-05-07 ENCOUNTER — Ambulatory Visit: Payer: Self-pay | Admitting: Nurse Practitioner

## 2024-05-07 ENCOUNTER — Inpatient Hospital Stay
Admission: RE | Admit: 2024-05-07 | Discharge: 2024-05-07 | Attending: Nurse Practitioner | Admitting: Nurse Practitioner

## 2024-05-07 ENCOUNTER — Encounter: Payer: Self-pay | Admitting: Nurse Practitioner

## 2024-05-07 DIAGNOSIS — Z1231 Encounter for screening mammogram for malignant neoplasm of breast: Secondary | ICD-10-CM

## 2024-05-07 LAB — LIPID PANEL W/O CHOL/HDL RATIO
Cholesterol, Total: 295 mg/dL — ABNORMAL HIGH (ref 100–199)
HDL: 51 mg/dL (ref >39)
LDL Chol Calc (NIH): 196 mg/dL — ABNORMAL HIGH (ref 0–99)
Triglycerides: 245 mg/dL — ABNORMAL HIGH (ref 0–149)
VLDL Cholesterol Cal: 48 mg/dL — ABNORMAL HIGH (ref 5–40)

## 2024-05-07 LAB — BASIC METABOLIC PANEL WITH GFR
BUN/Creatinine Ratio: 14 (ref 12–28)
BUN: 21 mg/dL (ref 8–27)
CO2: 18 mmol/L — ABNORMAL LOW (ref 20–29)
Calcium: 9.7 mg/dL (ref 8.7–10.3)
Chloride: 100 mmol/L (ref 96–106)
Creatinine, Ser: 1.53 mg/dL — ABNORMAL HIGH (ref 0.57–1.00)
Glucose: 78 mg/dL (ref 70–99)
Potassium: 4.7 mmol/L (ref 3.5–5.2)
Sodium: 138 mmol/L (ref 134–144)
eGFR: 38 mL/min/1.73 — ABNORMAL LOW (ref >59)

## 2024-05-07 MED ORDER — ROSUVASTATIN CALCIUM 10 MG PO TABS: 10.0000 mg | ORAL_TABLET | Freq: Every day | ORAL | 3 refills | Status: AC

## 2024-05-07 NOTE — Telephone Encounter (Signed)
 Copied from CRM #[id number]. Topic: Clinical - Medication Question >> May 07, 2024  3:21 PM Shanda MATSU wrote: Reason for CRM: Patient called in to adv that she is okay with stating med, Rosuvastatin, patient's preferred pharmacy is  [facility] - [place], [place] - [address] [place] [address] Phone: [phone] Fax: [phone] Patient is req a call back or a message via MyChart to adv when med has been sent to pharmacy.

## 2024-05-07 NOTE — Progress Notes (Signed)
 Contacted via MyChart  Good afternoon Nevaya, your labs have returned. Kidney function continues to show Stage 3b kidney disease. We will see how Olmesartan  does and recheck next visit. If continues to be at this level I may send you to kidney doctor at that time for further recommendations. Lipid panel shows LDL >190, for this I would recommend starting statin therapy as this level can place you at higher risk for stroke or heart event. I would recommend starting a low dose of Rosuvastatin. Would you be okay with this? Any questions? Keep being stellar!!  Thank you for allowing me to participate in your care.  I appreciate you. Kindest regards, Mainor Hellmann

## 2024-05-30 NOTE — Patient Instructions (Signed)
 Chronic Kidney Disease: Eating Plan Chronic kidney disease (CKD) is when your kidneys aren't working well. They can't remove waste, fluids, and other substances from your blood. When these substances build up, they can worsen kidney damage and affect your health. Eating certain foods can lead to a buildup of these substances. Changing your diet can help prevent more kidney damage. Diet changes may also delay dialysis or even keep you from needing it. What nutrients should I limit? Work with your health care team and an expert in healthy eating (dietitian) to make a meal plan that's right for you. Foods you can eat and foods you should limit or avoid will depend on: The stage of your kidney disease. Any other conditions you have. The items listed below are not a complete list. Talk with a dietitian to learn what's best for you. Potassium Potassium affects how well your heart beats. Too much potassium in your blood can cause an irregular heartbeat or even a heart attack. You may need to limit foods that are high in potassium, such as: Liquid milk and soy milk. Salt substitutes that contain potassium. Fruits like: Bananas. Apricots. Melon. Prunes and raisins. Kiwi. Nectarines and oranges. Vegetables, such as: Potatoes, sweet potatoes, and yams. Tomatoes. Leafy greens. Beets. Avocado. Pumpkin and winter squash. Beans, like lima beans. Nuts. Phosphorus Phosphorus is a mineral found in your bones. You need a balance between calcium and phosphorus to build and maintain healthy bones.  Too much added phosphorus from the foods you eat can pull calcium from your bones. Losing calcium can make your bones weak and more likely to break. Too much phosphorus can also make your skin itch. You may need to limit foods that are high in phosphorus or that have added phosphorus, such as: Liquid milk and dairy products. Dark-colored sodas or soft drinks. Bran cereals and oatmeal. Protein  Protein  helps your body make and keep muscle. Protein also helps to repair your body's cells and tissues.  One of the natural breakdown products of protein is a waste product called urea. When your kidneys aren't working well, they can't remove waste like urea. Reducing protein in your diet can help keep urea from building up in your blood. Depending on your stage of kidney disease, you may need to eat smaller portions of foods that are high in protein. Sources of animal protein include: Meat (all types). Fish and seafood. Poultry. Eggs. Dairy. Other protein foods include: Beans and legumes. Nuts and nut butter. Soy, like tofu.  Sodium Salt (sodium) helps to keep a healthy balance of fluids in your body. Too much salt can increase your blood pressure, which can harm your heart and lungs. Extra salt can also cause your body to keep too much fluid, making your kidneys work harder. You may need to limit or avoid foods that are high in salt, such as: Salt seasonings. Soy and teriyaki sauce. Meats that are: Packaged. Precooked. Cured. Processed. Salted crackers and snack foods. Fast food. Canned soups and foods. Pickled foods. Boxed mixes or ready-to-eat boxed meals and side dishes. Bottled dressings, sauces, and marinades. Talk with your dietitian about how much potassium, phosphorus, protein, and salt you may have each day. What are tips for following this plan? Reading food labels  Check the amount of salt in foods. Limit foods that have salt listed among the first five ingredients. Try to eat low-salt foods. Check the ingredient list for added phosphorus or potassium. "Phos" in an ingredient is a sign that  phosphorus has been added. Do not buy foods that are calcium-enriched or that have calcium added to them (fortified). Buy canned vegetables and beans that say "no salt added." Rinse them before eating. Lifestyle Limit the amount of protein you eat from animal sources each day. Focus on  protein from plant sources, like tofu and dried beans, peas, and lentils. Do not add salt to food when cooking or before eating. Do not eat star fruit. It can be toxic for people with kidney problems. Talk with your health care provider before taking any vitamin or mineral supplements. If told by your provider: Track how much liquid you have so you can avoid drinking too much. Try to eat foods that are made mostly from water, like gelatin, ice cream, soups, and juicy fruits and vegetables. If you have diabetes and chronic kidney disease: If you have diabetes and CKD, you need to keep your blood sugar (glucose) in the target range recommended by your provider. Follow your diabetes management plan. This may include: Checking your blood glucose regularly. Taking medicines by mouth, or taking insulin, or both. Exercising for at least 30 minutes on 5 or more days each week, or as told by your provider. Tracking how many servings of carbohydrates you eat at each meal. Not using orange juice to treat low blood sugars. Instead, use apple juice, cranberry juice, or clear soda. You may be given guidelines on what foods and nutrients you may eat, and how much you can have each day. This depends on your stage of kidney disease and whether you have high blood pressure. Follow the meal plan your dietitian gives you. Where to find more information [employer] of Diabetes and Digestive and Kidney Diseases: StageSync.si National Kidney Foundation: kidney.org This information is not intended to replace advice given to you by your health care provider. Make sure you discuss any questions you have with your health care provider. Document Revised: 12/24/2022 Document Reviewed: 12/24/2022 Elsevier Patient Education  2024 [employer].

## 2024-06-04 ENCOUNTER — Ambulatory Visit (INDEPENDENT_AMBULATORY_CARE_PROVIDER_SITE_OTHER): Admitting: Nurse Practitioner

## 2024-06-04 ENCOUNTER — Encounter: Payer: Self-pay | Admitting: Nurse Practitioner

## 2024-06-04 VITALS — BP 102/62 | HR 72 | Temp 98.6°F | Resp 15 | Ht 64.02 in | Wt 150.0 lb

## 2024-06-04 DIAGNOSIS — E782 Mixed hyperlipidemia: Secondary | ICD-10-CM

## 2024-06-04 DIAGNOSIS — F5101 Primary insomnia: Secondary | ICD-10-CM | POA: Diagnosis not present

## 2024-06-04 DIAGNOSIS — N1832 Chronic kidney disease, stage 3b: Secondary | ICD-10-CM

## 2024-06-04 DIAGNOSIS — N1831 Chronic kidney disease, stage 3a: Secondary | ICD-10-CM

## 2024-06-04 MED ORDER — TRAZODONE HCL 100 MG PO TABS: 100.0000 mg | ORAL_TABLET | Freq: Every evening | ORAL | 2 refills | Status: AC | PRN

## 2024-06-04 NOTE — Assessment & Plan Note (Addendum)
 Noted on labs since 2018.  Has been working on diet changes and does not take Ibuprofen products.  No history of contrast imaging.  Tolerating restart of Olmesartan  with no ADR, will monitor BP with this as runs on lower side.  This will offer benefit to CKD and proteinuria. Educated her on this. Did not tolerate Farxiga  - could consider trial of Jardiance in future but unsure she will tolerate. Refuses labs today , next visit obtain PTH, ANA, Urine ALB, CBC, BMP, ESR, CRP. Obtain kidney ultrasound next visit. Urine ALB 80 December 2025. - Recommended she try cutting back on Omeprazole  use due to her CKD, educated her on this.

## 2024-06-04 NOTE — Progress Notes (Addendum)
 "  BP 102/62 (BP Location: Left Arm, Patient Position: Sitting, Cuff Size: Normal)   Pulse 72   Temp 98.6 F (37 C) (Oral)   Resp 15   Ht 5' 4.02 (1.626 m)   Wt 150 lb (68 kg)   LMP  (LMP Unknown)   SpO2 95%   BMI 25.73 kg/m    Subjective:    Patient ID: Laura Soto, female    DOB: 20-Apr-1961, 64 y.o.   MRN: [id number]  HPI: Laura Soto is a 64 y.o. female  Chief Complaint  Patient presents with   Chronic Kidney Disease    Restarted Olmesartan  and Rosuvastatin . No side effects or concerns since starting either. Headaches have gone away at this point.     Med Change Request    Would like to change to 100 mg Trazadone nightly. Does give her nightmares but can get sleep and that's important for her.    CHRONIC KIDNEY DISEASE (CKD 3b) Restarted Olmesartan  at last visit, which she tolerates well. Tried Farxiga  but this caused side effects. CKD status: stable Medications renally dose: yes Previous renal evaluation: yes Pneumovax:  refuses Influenza Vaccine:  refuses   HYPERLIPIDEMIA Restarted Rosuvastatin , tolerating well. Hyperlipidemia status: good compliance Satisfied with current treatment?  yes Side effects:  no Medication compliance: good compliance Supplements: none Aspirin:  no The ASCVD Risk score (Arnett DK, et al., 2019) failed to calculate for the following reasons:   According to ACC/AHA guidelines, patients with an LDL-C level greater than 190 mg/dL (5.08 mmol/L) should be considered for high-intensity statin therapy. This patient's most recent LDL-C level is 196 mg/dL. Chest pain:  no Coronary artery disease:  no Family history CAD:  no Family history early CAD:  no  INSOMNIA Would like to increase Trazodone  to 100 MG, to get better sleep due to current stressors. Duration: years Satisfied with sleep quality: no Difficulty falling asleep: no Difficulty staying asleep: yes Waking a few hours after sleep onset: no Early morning awakenings:  no Daytime hypersomnolence: no Wakes feeling refreshed: yes Good sleep hygiene: yes Apnea: no Snoring: no Depressed/anxious mood: yes Recent stress: yes Restless legs/nocturnal leg cramps: no Chronic pain/arthritis: no History of sleep study: no Treatments attempted: melatonin      Relevant past medical, surgical, family and social history reviewed and updated as indicated. Interim medical history since our last visit reviewed. Allergies and medications reviewed and updated.  Review of Systems  Constitutional:  Negative for activity change, appetite change, diaphoresis, fatigue and fever.  Respiratory:  Negative for cough, chest tightness and shortness of breath.   Cardiovascular:  Negative for chest pain, palpitations and leg swelling.  Gastrointestinal: Negative.   Neurological: Negative.   Psychiatric/Behavioral:  Negative for decreased concentration, self-injury, sleep disturbance and suicidal ideas. The patient is not nervous/anxious.     Per HPI unless specifically indicated above     Objective:    BP 102/62 (BP Location: Left Arm, Patient Position: Sitting, Cuff Size: Normal)   Pulse 72   Temp 98.6 F (37 C) (Oral)   Resp 15   Ht 5' 4.02 (1.626 m)   Wt 150 lb (68 kg)   LMP  (LMP Unknown)   SpO2 95%   BMI 25.73 kg/m   Wt Readings from Last 3 Encounters:  06/04/24 150 lb (68 kg)  05/06/24 152 lb 9.6 oz (69.2 kg)  04/14/24 151 lb (68.5 kg)    Physical Exam Vitals and nursing note reviewed.  Constitutional:  General: She is awake. She is not in acute distress.    Appearance: She is well-developed and well-groomed. She is not ill-appearing or toxic-appearing.  HENT:     Head: Normocephalic.     Right Ear: Hearing and external ear normal.     Left Ear: Hearing and external ear normal.  Eyes:     General: Lids are normal.        Right eye: No discharge.        Left eye: No discharge.     Conjunctiva/sclera: Conjunctivae normal.     Pupils: Pupils are  equal, round, and reactive to light.  Neck:     Thyroid : No thyromegaly.     Vascular: No carotid bruit.  Cardiovascular:     Rate and Rhythm: Normal rate and regular rhythm.     Heart sounds: Normal heart sounds. No murmur heard.    No gallop.  Pulmonary:     Effort: Pulmonary effort is normal. No accessory muscle usage or respiratory distress.     Breath sounds: Normal breath sounds.  Abdominal:     General: Bowel sounds are normal. There is no distension.     Palpations: Abdomen is soft.     Tenderness: There is no abdominal tenderness.  Musculoskeletal:     Cervical back: Normal range of motion and neck supple.     Right lower leg: No edema.     Left lower leg: No edema.  Lymphadenopathy:     Cervical: No cervical adenopathy.  Skin:    General: Skin is warm and dry.  Neurological:     Mental Status: She is alert and oriented to person, place, and time.     Deep Tendon Reflexes: Reflexes are normal and symmetric.     Reflex Scores:      Brachioradialis reflexes are 2+ on the right side and 2+ on the left side.      Patellar reflexes are 2+ on the right side and 2+ on the left side. Psychiatric:        Attention and Perception: Attention normal.        Mood and Affect: Mood normal.        Speech: Speech normal.        Behavior: Behavior normal. Behavior is cooperative.        Thought Content: Thought content normal.     Results for orders placed or performed in visit on 05/06/24  Microalbumin, Urine Waived   Collection Time: 05/06/24 12:01 PM  Result Value Ref Range   Microalb, Ur Waived 80 (H) 0 - 19 mg/L   Creatinine, Urine Waived 200 10 - 300 mg/dL   Microalb/Creat Ratio 30-300 (H) <30 mg/g  Basic metabolic panel with GFR   Collection Time: 05/06/24 12:07 PM  Result Value Ref Range   Glucose 78 70 - 99 mg/dL   BUN 21 8 - 27 mg/dL   Creatinine, Ser 8.46 (H) 0.57 - 1.00 mg/dL   eGFR 38 (L) >40 fO/fpw/8.26   BUN/Creatinine Ratio 14 12 - 28   Sodium 138 134 - 144  mmol/L   Potassium 4.7 3.5 - 5.2 mmol/L   Chloride 100 96 - 106 mmol/L   CO2 18 (L) 20 - 29 mmol/L   Calcium  9.7 8.7 - 10.3 mg/dL  Lipid Panel w/o Chol/HDL Ratio   Collection Time: 05/06/24 12:07 PM  Result Value Ref Range   Cholesterol, Total 295 (H) 100 - 199 mg/dL   Triglycerides 754 (H) 0 - 149  mg/dL   HDL 51 >60 mg/dL   VLDL Cholesterol Cal 48 (H) 5 - 40 mg/dL   LDL Chol Calc (NIH) 803 (H) 0 - 99 mg/dL   LDL CALC COMMENT: Comment       Assessment & Plan:   Problem List Items Addressed This Visit       Genitourinary   CKD stage 3b, GFR 30-44 ml/min (HCC) - Primary   Noted on labs since 2018.  Has been working on diet changes and does not take Ibuprofen products.  No history of contrast imaging.  Tolerating restart of Olmesartan  with no ADR, will monitor BP with this as runs on lower side.  This will offer benefit to CKD and proteinuria. Educated her on this. Did not tolerate Farxiga  - could consider trial of Jardiance in future but unsure she will tolerate. Refuses labs today , next visit obtain PTH, ANA, Urine ALB, CBC, BMP, ESR, CRP. Obtain kidney ultrasound next visit. Urine ALB 80 December 2025. - Recommended she try cutting back on Omeprazole  use due to her CKD, educated her on this.        Other   Mixed hyperlipidemia   Chronic, ongoing. Tolerating Rosuvastatin , will continue this and adjust as needed. Labs next visit.      Insomnia   Chronic, ongoing.  Will increase Trazodone  to 100 MG as needed at night, educated her on this medication use and side effects.  She tolerates well at baseline. ?Maintain a regular sleep schedule, particularly a regular wake-up time in the morning ?Try not to force sleep ?Avoid caffeinated beverages after lunch ?Avoid alcohol near bedtime (eg, late afternoon and evening)  ?Avoid smoking or other nicotine intake, particularly during the evening ?Adjust the bedroom environment as needed to decrease stimuli (eg, reduce ambient light, turn  off the television or radio) ?Avoid prolonged use of light-emitting screens (laptops, tablets, smartphones, ebooks) before bedtime  ?Resolve concerns or worries before bedtime ?Exercise regularly for at least 20 minutes, preferably more than four to five hours prior to bedtime  ?Avoid daytime naps, especially if they are longer than 20 to 30 minutes or occur late in the day         Follow up plan: Return for As scheduled February 4th.      "

## 2024-06-04 NOTE — Assessment & Plan Note (Signed)
 Chronic, ongoing.  Will increase Trazodone  to 100 MG as needed at night, educated her on this medication use and side effects.  She tolerates well at baseline. ?Maintain a regular sleep schedule, particularly a regular wake-up time in the morning ?Try not to force sleep ?Avoid caffeinated beverages after lunch ?Avoid alcohol near bedtime (eg, late afternoon and evening)  ?Avoid smoking or other nicotine intake, particularly during the evening ?Adjust the bedroom environment as needed to decrease stimuli (eg, reduce ambient light, turn off the television or radio) ?Avoid prolonged use of light-emitting screens (laptops, tablets, smartphones, ebooks) before bedtime  ?Resolve concerns or worries before bedtime ?Exercise regularly for at least 20 minutes, preferably more than four to five hours prior to bedtime  ?Avoid daytime naps, especially if they are longer than 20 to 30 minutes or occur late in the day

## 2024-06-04 NOTE — Assessment & Plan Note (Signed)
 Chronic, ongoing. Tolerating Rosuvastatin , will continue this and adjust as needed. Labs next visit.

## 2024-06-30 ENCOUNTER — Ambulatory Visit: Admitting: Nurse Practitioner

## 2024-06-30 ENCOUNTER — Encounter: Payer: Self-pay | Admitting: Nurse Practitioner

## 2024-06-30 VITALS — BP 95/66 | HR 73 | Temp 99.0°F | Ht 64.0 in | Wt 149.2 lb

## 2024-06-30 DIAGNOSIS — F419 Anxiety disorder, unspecified: Secondary | ICD-10-CM

## 2024-06-30 DIAGNOSIS — Z0000 Encounter for general adult medical examination without abnormal findings: Secondary | ICD-10-CM

## 2024-06-30 DIAGNOSIS — H6992 Unspecified Eustachian tube disorder, left ear: Secondary | ICD-10-CM

## 2024-06-30 DIAGNOSIS — E063 Autoimmune thyroiditis: Secondary | ICD-10-CM

## 2024-06-30 DIAGNOSIS — N1832 Chronic kidney disease, stage 3b: Secondary | ICD-10-CM

## 2024-06-30 DIAGNOSIS — E782 Mixed hyperlipidemia: Secondary | ICD-10-CM

## 2024-06-30 DIAGNOSIS — K219 Gastro-esophageal reflux disease without esophagitis: Secondary | ICD-10-CM

## 2024-06-30 DIAGNOSIS — F5101 Primary insomnia: Secondary | ICD-10-CM

## 2024-06-30 DIAGNOSIS — R9431 Abnormal electrocardiogram [ECG] [EKG]: Secondary | ICD-10-CM

## 2024-06-30 MED ORDER — PREDNISONE 20 MG PO TABS: 40.0000 mg | ORAL_TABLET | Freq: Every day | ORAL | 0 refills | Status: AC

## 2024-06-30 MED ORDER — OMEPRAZOLE 20 MG PO CPDR: 20.0000 mg | DELAYED_RELEASE_CAPSULE | Freq: Every day | ORAL | 3 refills | Status: AC

## 2024-06-30 NOTE — Assessment & Plan Note (Signed)
 Chronic, ongoing.  Will increase Trazodone  to 100 MG as needed at night, educated her on this medication use and side effects.  She tolerates well at baseline. ?Maintain a regular sleep schedule, particularly a regular wake-up time in the morning ?Try not to force sleep ?Avoid caffeinated beverages after lunch ?Avoid alcohol near bedtime (eg, late afternoon and evening)  ?Avoid smoking or other nicotine intake, particularly during the evening ?Adjust the bedroom environment as needed to decrease stimuli (eg, reduce ambient light, turn off the television or radio) ?Avoid prolonged use of light-emitting screens (laptops, tablets, smartphones, ebooks) before bedtime  ?Resolve concerns or worries before bedtime ?Exercise regularly for at least 20 minutes, preferably more than four to five hours prior to bedtime  ?Avoid daytime naps, especially if they are longer than 20 to 30 minutes or occur late in the day

## 2024-06-30 NOTE — Assessment & Plan Note (Signed)
 Chronic, ongoing.  Continue current Levothyroxine  dose and adjust as needed.  Labs today.

## 2024-06-30 NOTE — Assessment & Plan Note (Signed)
 Chronic, stable. Denies SI/HI. Continue Prozac  + Trazodone  for sleep. Refills as needed.

## 2024-06-30 NOTE — Progress Notes (Addendum)
 "  Chief Complaint  Patient presents with   Welcome to Medicare     Subjective:   Laura Soto is a 64 y.o. female who presents for a Welcome to Medicare Exam.   Visit info / Clinical Intake: Medicare Wellness Visit Type:: Welcome to [employer]ERN[employer] SOCIAL RESEARCH OFFICER) Persons participating in visit and providing information:: patient Medicare Wellness Visit Mode:: In-person (required for WTM) Interpreter Needed?: No Pre-visit prep was completed: yes AWV questionnaire completed by patient prior to visit?: no Living arrangements:: with family/others Patient's Overall Health Status Rating: very good Typical amount of pain: none Does pain affect daily life?: no Are you currently prescribed opioids?: no  Dietary Habits and Nutritional Risks How many meals a day?: 2 Eats fruit and vegetables daily?: (!) no Most meals are obtained by: preparing own meals; eating out In the last 2 weeks, have you had any of the following?: none Diabetic:: no  Functional Status Activities of Daily Living (to include ambulation/medication): Independent Ambulation: Independent Medication Administration: Independent Home Management (perform basic housework or laundry): Independent Manage your own finances?: yes Primary transportation is: driving Concerns about vision?: no *vision screening is required for WTM* Concerns about hearing?: no  Fall Screening Falls in the past year?: 0 Number of falls in past year: 0 Was there an injury with Fall?: 0 Fall Risk Category Calculator: 0 Patient Fall Risk Level: Low Fall Risk  Fall Risk Patient at Risk for Falls Due to: No Fall Risks Fall risk Follow up: Falls evaluation completed  Home and Transportation Safety: All rugs have non-skid backing?: yes All stairs or steps have railings?: yes Grab bars in the bathtub or shower?: yes Have non-skid surface in bathtub or shower?: (!) no Good home lighting?: yes Regular seat belt use?: yes Hospital stays in the last year::  no  Cognitive Assessment Difficulty concentrating, remembering, or making decisions? : yes Will 6CIT or Mini Cog be Completed: yes What year is it?: 0 points What month is it?: 0 points Give patient an address phrase to remember (5 components): [address] in [place], [place] About what time is it?: 0 points Count backwards from 20 to 1: 0 points Say the months of the year in reverse: 0 points Repeat the address phrase from earlier: 0 points 6 CIT Score: 0 points  Advance Directives (For Healthcare) Does Patient Have a Medical Advance Directive?: No Would patient like information on creating a medical advance directive?: No - Patient declined  Reviewed/Updated  Reviewed/Updated: Reviewed All (Medical, Surgical, Family, Medications, Allergies, Care Teams, Patient Goals)    Allergies (verified) Patient has no known allergies.   Current Medications (verified) Outpatient Encounter Medications as of 06/30/2024  Medication Sig   Albuterol  Sulfate, sensor, (PROAIR  DIGIHALER) 108 (90 Base) MCG/ACT AEPB Inhale 1 puff into the lungs every 6 (six) hours as needed (for wheezing with allergies).   FLUoxetine  (PROZAC ) 20 MG capsule Take 20 mg by mouth daily.   levothyroxine  (SYNTHROID ) 112 MCG tablet Take 1 tablet (112 mcg total) by mouth daily before breakfast.   olmesartan  (BENICAR ) 5 MG tablet Take 1 tablet (5 mg total) by mouth daily.   predniSONE  (DELTASONE ) 20 MG tablet Take 2 tablets (40 mg total) by mouth daily with breakfast for 5 days.   rosuvastatin  (CRESTOR ) 10 MG tablet Take 1 tablet (10 mg total) by mouth daily.   traZODone  (DESYREL ) 100 MG tablet Take 1 tablet (100 mg total) by mouth at bedtime as needed for sleep.   [DISCONTINUED] omeprazole  (PRILOSEC) 20 MG  capsule Take 1 capsule (20 mg total) by mouth daily.   omeprazole  (PRILOSEC) 20 MG capsule Take 1 capsule (20 mg total) by mouth daily.   No facility-administered encounter medications on file as of 06/30/2024.     History: Past Medical History:  Diagnosis Date   Abnormal mammogram, unspecified    Breast screening, unspecified    Depression    GERD (gastroesophageal reflux disease)    Hypercalcemia    Hyperlipidemia    Hypothyroidism    Osteopenia    Personal history of tobacco use, presenting hazards to health    Past Surgical History:  Procedure Laterality Date   COLONOSCOPY WITH PROPOFOL  N/A 06/25/2017   Procedure: COLONOSCOPY WITH PROPOFOL ;  Surgeon: Therisa Bi, MD;  Location: [facility] ENDOSCOPY;  Service: Gastroenterology;  Laterality: N/A;   ESOPHAGOGASTRODUODENOSCOPY (EGD) WITH PROPOFOL  N/A 06/25/2017   Procedure: ESOPHAGOGASTRODUODENOSCOPY (EGD) WITH PROPOFOL ;  Surgeon: Therisa Bi, MD;  Location: [facility] ENDOSCOPY;  Service: Gastroenterology;  Laterality: N/A;   WISDOM TOOTH EXTRACTION     Family History  Problem Relation Age of Onset   Stroke Mother    Atrial fibrillation Mother    Liver cancer Father        age 65   Diabetes Father    Social History   Occupational History   Not on file  Tobacco Use   Smoking status: Former    Current packs/day: 0.00    Average packs/day: 0.3 packs/day for 20.0 years (5.0 ttl pk-yrs)    Types: Cigarettes    Start date: 12/26/1994    Quit date: 12/26/2014    Years since quitting: 9.5   Smokeless tobacco: Never  Vaping Use   Vaping status: Never Used  Substance and Sexual Activity   Alcohol use: Yes    Comment: occasionally - wine. none last 24hrs   Drug use: No   Sexual activity: Never   Tobacco Counseling Counseling given: Not Answered  SDOH Screenings   Food Insecurity: No Food Insecurity (06/30/2024)  Housing: Low Risk (06/30/2024)  Transportation Needs: No Transportation Needs (06/30/2024)  Utilities: Not At Risk (06/30/2024)  Depression (PHQ2-9): Low Risk (06/04/2024)  Physical Activity: Inactive (06/30/2024)  Social Connections: Socially Isolated (06/30/2024)  Stress: No Stress Concern Present (06/30/2024)  Tobacco Use: Medium Risk  (06/30/2024)  Health Literacy: Adequate Health Literacy (06/30/2024)   See flowsheets for full screening details  Depression Screen Depression Screening Exception Documentation Depression Screening Exception:: Patient refusal  PHQ 2 & 9 Depression Scale- Over the past 2 weeks, how often have you been bothered by any of the following problems? Little interest or pleasure in doing things: 0 Feeling down, depressed, or hopeless (PHQ Adolescent also includes...irritable): 0 PHQ-2 Total Score: 0 Trouble falling or staying asleep, or sleeping too much: 0 Feeling tired or having little energy: 0 Poor appetite or overeating (PHQ Adolescent also includes...weight loss): 0 Feeling bad about yourself - or that you are a failure or have let yourself or your family down: 0 Trouble concentrating on things, such as reading the newspaper or watching television (PHQ Adolescent also includes...like school work): 0 Moving or speaking so slowly that other people could have noticed. Or the opposite - being so fidgety or restless that you have been moving around a lot more than usual: 0 Thoughts that you would be better off dead, or of hurting yourself in some way: 0 PHQ-9 Total Score: 0 If you checked off any problems, how difficult have these problems made it for you to do your work, take  care of things at home, or get along with other people?: Not difficult at all  Depression Treatment Depression Interventions/Treatment : Currently on Treatment      Goals Addressed   None          Objective:    Today's Vitals   06/30/24 1301  BP: 95/66  Pulse: 73  Temp: 99 F (37.2 C)  TempSrc: Oral  SpO2: 94%  Weight: 149 lb 3.2 oz (67.7 kg)  Height: 5' 4 (1.626 m)  PainSc: 0-No pain   Body mass index is 25.61 kg/m.   Physical Exam Vitals and nursing note reviewed. Exam conducted with a chaperone present.  Constitutional:      General: She is awake. She is not in acute distress.    Appearance: She is  well-developed and well-groomed. She is not ill-appearing or toxic-appearing.  HENT:     Head: Normocephalic and atraumatic.     Right Ear: Hearing, tympanic membrane, ear canal and external ear normal. No drainage. No middle ear effusion.     Left Ear: Hearing, ear canal and external ear normal. No drainage. A middle ear effusion is present.     Nose: Nose normal.     Right Sinus: No maxillary sinus tenderness or frontal sinus tenderness.     Left Sinus: No maxillary sinus tenderness or frontal sinus tenderness.     Mouth/Throat:     Mouth: Mucous membranes are moist.     Pharynx: Oropharynx is clear. Uvula midline. No pharyngeal swelling, oropharyngeal exudate or posterior oropharyngeal erythema.  Eyes:     General: Lids are normal.        Right eye: No discharge.        Left eye: No discharge.     Extraocular Movements: Extraocular movements intact.     Conjunctiva/sclera: Conjunctivae normal.     Pupils: Pupils are equal, round, and reactive to light.     Visual Fields: Right eye visual fields normal and left eye visual fields normal.  Neck:     Thyroid : No thyromegaly.     Vascular: No carotid bruit.     Trachea: Trachea normal.  Cardiovascular:     Rate and Rhythm: Normal rate and regular rhythm.     Heart sounds: Normal heart sounds. No murmur heard.    No gallop.  Pulmonary:     Effort: Pulmonary effort is normal. No accessory muscle usage or respiratory distress.     Breath sounds: Normal breath sounds.  Chest:  Breasts:    Right: Normal.     Left: Normal.  Abdominal:     General: Bowel sounds are normal.     Palpations: Abdomen is soft. There is no hepatomegaly or splenomegaly.     Tenderness: There is no abdominal tenderness.  Musculoskeletal:        General: Normal range of motion.     Cervical back: Normal range of motion and neck supple.     Right lower leg: No edema.     Left lower leg: No edema.  Lymphadenopathy:     Head:     Right side of head: No  submental, submandibular, tonsillar, preauricular or posterior auricular adenopathy.     Left side of head: No submental, submandibular, tonsillar, preauricular or posterior auricular adenopathy.     Cervical: No cervical adenopathy.     Upper Body:     Right upper body: No supraclavicular, axillary or pectoral adenopathy.     Left upper body: No supraclavicular, axillary or  pectoral adenopathy.  Skin:    General: Skin is warm and dry.     Capillary Refill: Capillary refill takes less than 2 seconds.     Findings: No rash.  Neurological:     Mental Status: She is alert and oriented to person, place, and time.     Gait: Gait is intact.     Deep Tendon Reflexes: Reflexes are normal and symmetric.     Reflex Scores:      Brachioradialis reflexes are 2+ on the right side and 2+ on the left side.      Patellar reflexes are 2+ on the right side and 2+ on the left side. Psychiatric:        Attention and Perception: Attention normal.        Mood and Affect: Mood normal.        Speech: Speech normal.        Behavior: Behavior normal. Behavior is cooperative.        Thought Content: Thought content normal.        Judgment: Judgment normal.    Hearing/Vision screen Hearing Screening   500Hz  1000Hz  2000Hz  4000Hz   Right ear 20 40 40 20  Left ear 20 20 20 20    Vision Screening   Right eye Left eye Both eyes  Without correction     With correction 20/25 20/20 20/20    Immunizations and Health Maintenance Health Maintenance  Topic Date Due   COVID-19 Vaccine (3 - 2025-26 season) 07/12/2024 (Originally 01/26/2024)   Influenza Vaccine  08/24/2024 (Originally 12/26/2023)   Zoster Vaccines- Shingrix (1 of 2) 08/28/2024 (Originally 09/01/2008)   Pneumococcal Vaccine: 50+ Years (1 of 2 - PCV) 02/09/2025 (Originally 09/01/1977)   Bone Density Scan  02/09/2025 (Originally 09/02/2023)   Colonoscopy  04/14/2025 (Originally 06/25/2020)   DTaP/Tdap/Td (2 - Tdap) 05/28/2025   Medicare Annual Wellness (AWV)   06/30/2025   Mammogram  05/07/2026   Cervical Cancer Screening (HPV/Pap Cotest)  01/02/2027   Hepatitis C Screening  Completed   Hepatitis B Vaccines 19-59 Average Risk  Aged Out   Meningococcal B Vaccine  Aged Out   HIV Screening  Discontinued    EKG My review and personal interpretation at Time: 1330   Indication: Welcome to Medicare  Rate: 66  Rhythm: sinus Axis: normal Other: No stemi, no lvh, slightly prolonged ST interval     Assessment/Plan:  This is a routine wellness examination for Laura Soto.  Patient Care Team: Marquee Fuchs T, NP as PCP - General (Nurse Practitioner)  I have personally reviewed and noted the following in the patients chart:   Medical and social history Use of alcohol, tobacco or illicit drugs  Current medications and supplements including opioid prescriptions. Functional ability and status Nutritional status Physical activity Advanced directives List of other physicians Hospitalizations, surgeries, and ER visits in previous 12 months Vitals Screenings to include cognitive, depression, and falls Referrals and appointments  Orders Placed This Encounter  Procedures   CBC with Differential/Platelet   Comprehensive metabolic panel with GFR   Lipid Panel w/o Chol/HDL Ratio   TSH   T4, free   C-reactive protein   Sedimentation rate   ANA 12 Plus Profile (RDL)   Magnesium   Parathyroid hormone, intact (no Ca)    Standing Status:   Future    Expiration Date:   06/30/2025   Microalbumin, Urine Waived    Standing Status:   Future    Expiration Date:   06/30/2025   Ambulatory referral  to Cardiology    Referral Priority:   Routine    Referral Type:   Consultation    Referral Reason:   Specialty Services Required    Number of Visits Requested:   1   EKG 12-Lead   In addition, I have reviewed and discussed with patient certain preventive protocols, quality metrics, and best practice recommendations. A written personalized care plan for preventive  services as well as general preventive health recommendations were provided to patient.  Lyrik Buresh T Kwynn Schlotter, NP   06/30/2024   Return in about 3 months (around 09/27/2024) for CKD, MOOD.  "

## 2024-06-30 NOTE — Assessment & Plan Note (Signed)
 Chronic, ongoing.  Tolerating Rosuvastatin , will continue this and adjust as needed.  Labs today.

## 2024-06-30 NOTE — Assessment & Plan Note (Signed)
Chronic, ongoing.  Continue current medication regimen and adjust as needed.   Magnesium level today. Risks of PPI use were discussed with patient including bone loss, C. Diff diarrhea, pneumonia, infections, CKD, electrolyte abnormalities.  Verbalizes understanding and chooses to continue the medication.

## 2024-06-30 NOTE — Assessment & Plan Note (Signed)
 Noted on labs since 2018. No history of contrast imaging and no frequent Iburpofen use.  Tolerating restart of Olmesartan  with no ADR, will monitor BP with this as runs on lower side.  This will offer benefit to CKD and proteinuria. Educated her on this. Did not tolerate Farxiga  - could consider trial of Jardiance in future but unsure she will tolerate. LABS: PTH, ANA, Urine ALB, CBC, BMP, ESR, CRP. Obtain kidney ultrasound next visit dependent on labs. Urine ALB 80 December 2025. - Recommended she try cutting back on Omeprazole  use due to her CKD, educated her on this.

## 2024-06-30 NOTE — Addendum Note (Signed)
 Addended by: Gregoria Selvy T on: 06/30/2024 02:48 PM   Modules accepted: Orders

## 2024-06-30 NOTE — Patient Instructions (Signed)
 Chronic Kidney Disease: Eating Plan Chronic kidney disease (CKD) is when your kidneys aren't working well. They can't remove waste, fluids, and other substances from your blood. When these substances build up, they can worsen kidney damage and affect your health. Eating certain foods can lead to a buildup of these substances. Changing your diet can help prevent more kidney damage. Diet changes may also delay dialysis or even keep you from needing it. What nutrients should I limit? Work with your health care team and an expert in healthy eating (dietitian) to make a meal plan that's right for you. Foods you can eat and foods you should limit or avoid will depend on: The stage of your kidney disease. Any other conditions you have. The items listed below are not a complete list. Talk with a dietitian to learn what's best for you. Potassium Potassium affects how well your heart beats. Too much potassium in your blood can cause an irregular heartbeat or even a heart attack. You may need to limit foods that are high in potassium, such as: Liquid milk and soy milk. Salt substitutes that contain potassium. Fruits like: Bananas. Apricots. Melon. Prunes and raisins. Kiwi. Nectarines and oranges. Vegetables, such as: Potatoes, sweet potatoes, and yams. Tomatoes. Leafy greens. Beets. Avocado. Pumpkin and winter squash. Beans, like lima beans. Nuts. Phosphorus Phosphorus is a mineral found in your bones. You need a balance between calcium and phosphorus to build and maintain healthy bones.  Too much added phosphorus from the foods you eat can pull calcium from your bones. Losing calcium can make your bones weak and more likely to break. Too much phosphorus can also make your skin itch. You may need to limit foods that are high in phosphorus or that have added phosphorus, such as: Liquid milk and dairy products. Dark-colored sodas or soft drinks. Bran cereals and oatmeal. Protein  Protein  helps your body make and keep muscle. Protein also helps to repair your body's cells and tissues.  One of the natural breakdown products of protein is a waste product called urea. When your kidneys aren't working well, they can't remove waste like urea. Reducing protein in your diet can help keep urea from building up in your blood. Depending on your stage of kidney disease, you may need to eat smaller portions of foods that are high in protein. Sources of animal protein include: Meat (all types). Fish and seafood. Poultry. Eggs. Dairy. Other protein foods include: Beans and legumes. Nuts and nut butter. Soy, like tofu.  Sodium Salt (sodium) helps to keep a healthy balance of fluids in your body. Too much salt can increase your blood pressure, which can harm your heart and lungs. Extra salt can also cause your body to keep too much fluid, making your kidneys work harder. You may need to limit or avoid foods that are high in salt, such as: Salt seasonings. Soy and teriyaki sauce. Meats that are: Packaged. Precooked. Cured. Processed. Salted crackers and snack foods. Fast food. Canned soups and foods. Pickled foods. Boxed mixes or ready-to-eat boxed meals and side dishes. Bottled dressings, sauces, and marinades. Talk with your dietitian about how much potassium, phosphorus, protein, and salt you may have each day. What are tips for following this plan? Reading food labels  Check the amount of salt in foods. Limit foods that have salt listed among the first five ingredients. Try to eat low-salt foods. Check the ingredient list for added phosphorus or potassium. "Phos" in an ingredient is a sign that  phosphorus has been added. Do not buy foods that are calcium-enriched or that have calcium added to them (fortified). Buy canned vegetables and beans that say "no salt added." Rinse them before eating. Lifestyle Limit the amount of protein you eat from animal sources each day. Focus on  protein from plant sources, like tofu and dried beans, peas, and lentils. Do not add salt to food when cooking or before eating. Do not eat star fruit. It can be toxic for people with kidney problems. Talk with your health care provider before taking any vitamin or mineral supplements. If told by your provider: Track how much liquid you have so you can avoid drinking too much. Try to eat foods that are made mostly from water, like gelatin, ice cream, soups, and juicy fruits and vegetables. If you have diabetes and chronic kidney disease: If you have diabetes and CKD, you need to keep your blood sugar (glucose) in the target range recommended by your provider. Follow your diabetes management plan. This may include: Checking your blood glucose regularly. Taking medicines by mouth, or taking insulin, or both. Exercising for at least 30 minutes on 5 or more days each week, or as told by your provider. Tracking how many servings of carbohydrates you eat at each meal. Not using orange juice to treat low blood sugars. Instead, use apple juice, cranberry juice, or clear soda. You may be given guidelines on what foods and nutrients you may eat, and how much you can have each day. This depends on your stage of kidney disease and whether you have high blood pressure. Follow the meal plan your dietitian gives you. Where to find more information [employer] of Diabetes and Digestive and Kidney Diseases: StageSync.si National Kidney Foundation: kidney.org This information is not intended to replace advice given to you by your health care provider. Make sure you discuss any questions you have with your health care provider. Document Revised: 12/24/2022 Document Reviewed: 12/24/2022 Elsevier Patient Education  2024 [employer].

## 2024-07-01 ENCOUNTER — Ambulatory Visit: Payer: Self-pay | Admitting: Nurse Practitioner

## 2024-07-01 DIAGNOSIS — N1832 Chronic kidney disease, stage 3b: Secondary | ICD-10-CM

## 2024-07-01 LAB — CBC WITH DIFFERENTIAL/PLATELET
Basophils Absolute: 0.1 10*3/uL (ref 0.0–0.2)
Basos: 1 %
EOS (ABSOLUTE): 0.2 10*3/uL (ref 0.0–0.4)
Eos: 2 %
Hematocrit: 41.9 % (ref 34.0–46.6)
Hemoglobin: 13.7 g/dL (ref 11.1–15.9)
Immature Grans (Abs): 0 10*3/uL (ref 0.0–0.1)
Immature Granulocytes: 0 %
Lymphocytes Absolute: 3.9 10*3/uL — ABNORMAL HIGH (ref 0.7–3.1)
Lymphs: 39 %
MCH: 28.7 pg (ref 26.6–33.0)
MCHC: 32.7 g/dL (ref 31.5–35.7)
MCV: 88 fL (ref 79–97)
Monocytes Absolute: 0.6 10*3/uL (ref 0.1–0.9)
Monocytes: 6 %
Neutrophils Absolute: 5.4 10*3/uL (ref 1.4–7.0)
Neutrophils: 52 %
Platelets: 233 10*3/uL (ref 150–450)
RBC: 4.77 x10E6/uL (ref 3.77–5.28)
RDW: 13.6 % (ref 11.7–15.4)
WBC: 10 10*3/uL (ref 3.4–10.8)

## 2024-07-01 LAB — COMPREHENSIVE METABOLIC PANEL WITH GFR
ALT: 33 [IU]/L — ABNORMAL HIGH (ref 0–32)
AST: 32 [IU]/L (ref 0–40)
Albumin: 4.2 g/dL (ref 3.9–4.9)
Alkaline Phosphatase: 122 [IU]/L (ref 49–135)
BUN/Creatinine Ratio: 18 (ref 12–28)
BUN: 31 mg/dL — ABNORMAL HIGH (ref 8–27)
Bilirubin Total: 0.4 mg/dL (ref 0.0–1.2)
CO2: 19 mmol/L — ABNORMAL LOW (ref 20–29)
Calcium: 9.4 mg/dL (ref 8.7–10.3)
Chloride: 105 mmol/L (ref 96–106)
Creatinine, Ser: 1.68 mg/dL — ABNORMAL HIGH (ref 0.57–1.00)
Globulin, Total: 3.1 g/dL (ref 1.5–4.5)
Glucose: 81 mg/dL (ref 70–99)
Potassium: 4.9 mmol/L (ref 3.5–5.2)
Sodium: 139 mmol/L (ref 134–144)
Total Protein: 7.3 g/dL (ref 6.0–8.5)
eGFR: 34 mL/min/{1.73_m2} — ABNORMAL LOW

## 2024-07-01 LAB — LIPID PANEL W/O CHOL/HDL RATIO
Cholesterol, Total: 205 mg/dL — ABNORMAL HIGH (ref 100–199)
HDL: 48 mg/dL
LDL Chol Calc (NIH): 121 mg/dL — ABNORMAL HIGH (ref 0–99)
Triglycerides: 207 mg/dL — ABNORMAL HIGH (ref 0–149)
VLDL Cholesterol Cal: 36 mg/dL (ref 5–40)

## 2024-07-01 LAB — ANA 12 PLUS PROFILE (RDL)

## 2024-07-01 LAB — SEDIMENTATION RATE: Sed Rate: 8 mm/h (ref 0–40)

## 2024-07-01 LAB — MAGNESIUM: Magnesium: 1.9 mg/dL (ref 1.6–2.3)

## 2024-07-01 LAB — TSH: TSH: 0.942 u[IU]/mL (ref 0.450–4.500)

## 2024-07-01 LAB — T4, FREE: Free T4: 1.83 ng/dL — ABNORMAL HIGH (ref 0.82–1.77)

## 2024-07-01 LAB — C-REACTIVE PROTEIN: CRP: 3 mg/L (ref 0–10)

## 2024-07-01 NOTE — Progress Notes (Signed)
 Contacted via MyChart The ASCVD Risk score (Arnett DK, et al., 2019) failed to calculate for the following reasons:   According to ACC/AHA guidelines, patients with an LDL-C level greater than 190 mg/dL (5.08 mmol/L) should be considered for high-intensity statin therapy. This patient's most recent LDL-C level is 196 mg/dL.  Good morning Monick, your labs are returning. Still some pending. - CBC continues to show overall baseline levels for you. Lymphocytes have been mildly elevated since 2018 on review and we can continue to monitor this. - Kidney function continues to show ongoing chronic kidney disease Stage 3b. I am going to place a referral to nephrology for further assessment and recommendations since this continues to be in that 3b stage. Liver function, AST and ALT, shows very mild elevation in ALT which we can monitor. - Lipid panel continues to show elevations, but LDL is trending down some. Continue current Rosuvastatin  dosing for now, but we may increase next visit. - Remainder of labs currently stable. Any questions? Keep being stellar!!  Thank you for allowing me to participate in your care.  I appreciate you. Kindest regards, Shannen Flansburg

## 2024-08-09 ENCOUNTER — Encounter: Admitting: Nurse Practitioner

## 2024-09-28 ENCOUNTER — Ambulatory Visit: Admitting: Nurse Practitioner
